# Patient Record
Sex: Female | Born: 1955
Health system: Southern US, Community
[De-identification: ages and names within clinical notes are randomized; demographics above are authoritative.]

## PROBLEM LIST (undated history)

## (undated) DIAGNOSIS — D649 Anemia, unspecified: Secondary | ICD-10-CM

## (undated) DIAGNOSIS — T7840XA Allergy, unspecified, initial encounter: Secondary | ICD-10-CM

## (undated) DIAGNOSIS — I214 Non-ST elevation (NSTEMI) myocardial infarction: Secondary | ICD-10-CM

## (undated) DIAGNOSIS — E7800 Pure hypercholesterolemia, unspecified: Secondary | ICD-10-CM

## (undated) DIAGNOSIS — I10 Essential (primary) hypertension: Secondary | ICD-10-CM

## (undated) DIAGNOSIS — E079 Disorder of thyroid, unspecified: Secondary | ICD-10-CM

## (undated) DIAGNOSIS — M1990 Unspecified osteoarthritis, unspecified site: Secondary | ICD-10-CM

## (undated) HISTORY — PX: EYE SURGERY: SHX253

## (undated) HISTORY — DX: Allergy, unspecified, initial encounter: T78.40XA

## (undated) HISTORY — DX: Anemia, unspecified: D64.9

## (undated) HISTORY — PX: FOOT SURGERY: SHX648

## (undated) HISTORY — DX: Unspecified osteoarthritis, unspecified site: M19.90

## (undated) HISTORY — PX: TUBAL LIGATION: SHX77

## (undated) HISTORY — PX: THYROID SURGERY: SHX805

## (undated) HISTORY — DX: Non-ST elevation (NSTEMI) myocardial infarction: I21.4

---

## 1999-10-10 ENCOUNTER — Encounter: Admission: RE | Admit: 1999-10-10 | Discharge: 1999-10-10 | Payer: Self-pay | Admitting: Rheumatology

## 1999-10-10 ENCOUNTER — Other Ambulatory Visit: Admission: RE | Admit: 1999-10-10 | Discharge: 1999-10-10 | Payer: Self-pay | Admitting: Obstetrics and Gynecology

## 1999-10-10 ENCOUNTER — Encounter: Payer: Self-pay | Admitting: Rheumatology

## 2000-04-01 ENCOUNTER — Encounter: Payer: Self-pay | Admitting: Internal Medicine

## 2000-04-01 ENCOUNTER — Encounter: Admission: RE | Admit: 2000-04-01 | Discharge: 2000-04-01 | Payer: Self-pay | Admitting: Internal Medicine

## 2000-11-25 ENCOUNTER — Other Ambulatory Visit: Admission: RE | Admit: 2000-11-25 | Discharge: 2000-11-25 | Payer: Self-pay | Admitting: Obstetrics and Gynecology

## 2001-12-17 ENCOUNTER — Other Ambulatory Visit: Admission: RE | Admit: 2001-12-17 | Discharge: 2001-12-17 | Payer: Self-pay | Admitting: Obstetrics and Gynecology

## 2002-12-20 ENCOUNTER — Other Ambulatory Visit: Admission: RE | Admit: 2002-12-20 | Discharge: 2002-12-20 | Payer: Self-pay | Admitting: Obstetrics and Gynecology

## 2003-12-29 ENCOUNTER — Other Ambulatory Visit: Admission: RE | Admit: 2003-12-29 | Discharge: 2003-12-29 | Payer: Self-pay | Admitting: Obstetrics and Gynecology

## 2004-04-24 ENCOUNTER — Encounter: Admission: RE | Admit: 2004-04-24 | Discharge: 2004-07-23 | Payer: Self-pay | Admitting: Internal Medicine

## 2007-01-09 ENCOUNTER — Other Ambulatory Visit: Admission: RE | Admit: 2007-01-09 | Discharge: 2007-01-09 | Payer: Self-pay | Admitting: Obstetrics and Gynecology

## 2011-08-02 ENCOUNTER — Emergency Department (HOSPITAL_COMMUNITY)
Admission: EM | Admit: 2011-08-02 | Discharge: 2011-08-02 | Disposition: A | Discharge status: Home / Self Care | Payer: No Typology Code available for payment source | Attending: Emergency Medicine | Admitting: Emergency Medicine

## 2011-08-02 ENCOUNTER — Emergency Department (HOSPITAL_COMMUNITY): Payer: No Typology Code available for payment source

## 2011-08-02 ENCOUNTER — Encounter: Payer: Self-pay | Admitting: *Deleted

## 2011-08-02 DIAGNOSIS — R0781 Pleurodynia: Secondary | ICD-10-CM

## 2011-08-02 DIAGNOSIS — Z79899 Other long term (current) drug therapy: Secondary | ICD-10-CM | POA: Insufficient documentation

## 2011-08-02 DIAGNOSIS — R079 Chest pain, unspecified: Secondary | ICD-10-CM | POA: Insufficient documentation

## 2011-08-02 DIAGNOSIS — R1011 Right upper quadrant pain: Secondary | ICD-10-CM | POA: Insufficient documentation

## 2011-08-02 DIAGNOSIS — I10 Essential (primary) hypertension: Secondary | ICD-10-CM | POA: Insufficient documentation

## 2011-08-02 DIAGNOSIS — E119 Type 2 diabetes mellitus without complications: Secondary | ICD-10-CM | POA: Insufficient documentation

## 2011-08-02 HISTORY — DX: Disorder of thyroid, unspecified: E07.9

## 2011-08-02 HISTORY — DX: Essential (primary) hypertension: I10

## 2011-08-02 LAB — HEPATIC FUNCTION PANEL
ALT: 16 U/L (ref 0–35)
AST: 16 U/L (ref 0–37)
Albumin: 3.8 g/dL (ref 3.5–5.2)
Alkaline Phosphatase: 63 U/L (ref 39–117)
Bilirubin, Direct: <0.1 mg/dL (ref 0.0–0.3)
Total Bilirubin: 0.3 mg/dL (ref 0.3–1.2)
Total Protein: 7.3 g/dL (ref 6.0–8.3)

## 2011-08-02 LAB — PROTIME-INR
INR: 0.97 (ref 0.00–1.49)
Prothrombin Time: 13.1 seconds (ref 11.6–15.2)

## 2011-08-02 LAB — BASIC METABOLIC PANEL
BUN: 9 mg/dL (ref 6–23)
CO2: 27 mEq/L (ref 19–32)
Calcium: 10 mg/dL (ref 8.4–10.5)
Chloride: 100 mEq/L (ref 96–112)
Creatinine, Ser: 0.87 mg/dL (ref 0.50–1.10)
GFR calc Af Amer: 85 mL/min — ABNORMAL LOW (ref >90)
GFR calc non Af Amer: 73 mL/min — ABNORMAL LOW (ref >90)
Glucose, Bld: 123 mg/dL — ABNORMAL HIGH (ref 70–99)
Potassium: 3.7 mEq/L (ref 3.5–5.1)
Sodium: 140 mEq/L (ref 135–145)

## 2011-08-02 LAB — POCT I-STAT TROPONIN I: Troponin i, poc: 0 ng/mL (ref 0.00–0.08)

## 2011-08-02 LAB — CBC
HCT: 33 % — ABNORMAL LOW (ref 36.0–46.0)
Hemoglobin: 10.8 g/dL — ABNORMAL LOW (ref 12.0–15.0)
MCH: 28.1 pg (ref 26.0–34.0)
MCHC: 32.7 g/dL (ref 30.0–36.0)
MCV: 85.7 fL (ref 78.0–100.0)
Platelets: 236 10*3/uL (ref 150–400)
RBC: 3.85 MIL/uL — ABNORMAL LOW (ref 3.87–5.11)
RDW: 13.7 % (ref 11.5–15.5)
WBC: 4.4 10*3/uL (ref 4.0–10.5)

## 2011-08-02 LAB — LIPASE, BLOOD: Lipase: 36 U/L (ref 11–59)

## 2011-08-02 LAB — GLUCOSE, CAPILLARY: Glucose-Capillary: 114 mg/dL — ABNORMAL HIGH (ref 70–99)

## 2011-08-02 MED ORDER — HYDROCODONE-ACETAMINOPHEN 5-325 MG PO TABS: 2.0000 | ORAL_TABLET | ORAL | Status: AC | PRN

## 2011-08-02 MED ORDER — IOHEXOL 300 MG/ML  SOLN
100.0000 mL | Freq: Once | INTRAMUSCULAR | Status: AC | PRN
  Administered 2011-08-02: 100 mL via INTRAVENOUS

## 2011-08-02 MED ORDER — IBUPROFEN 800 MG PO TABS: 800.0000 mg | ORAL_TABLET | Freq: Three times a day (TID) | ORAL | Status: AC

## 2011-08-02 NOTE — ED Notes (Signed)
No old ecg 

## 2011-08-02 NOTE — ED Notes (Signed)
Family at bedside. 

## 2011-08-02 NOTE — ED Notes (Signed)
Dis charge IV PER TO DISCHARGE. 

## 2011-08-02 NOTE — ED Notes (Signed)
Check pt. cbg 114mg  report to ED WHITE RN.
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
## 2012-01-20 ENCOUNTER — Ambulatory Visit
Admission: RE | Admit: 2012-01-20 | Discharge: 2012-01-20 | Disposition: A | Discharge status: Home / Self Care | Payer: BC Managed Care – PPO | Source: Ambulatory Visit | Attending: Family Medicine | Admitting: Family Medicine

## 2012-01-20 ENCOUNTER — Other Ambulatory Visit: Payer: Self-pay | Admitting: Family Medicine

## 2012-01-20 DIAGNOSIS — R609 Edema, unspecified: Secondary | ICD-10-CM

## 2012-01-20 DIAGNOSIS — R52 Pain, unspecified: Secondary | ICD-10-CM

## 2012-04-23 ENCOUNTER — Encounter: Payer: Self-pay | Admitting: Obstetrics and Gynecology

## 2012-08-23 DIAGNOSIS — I214 Non-ST elevation (NSTEMI) myocardial infarction: Secondary | ICD-10-CM

## 2012-08-23 HISTORY — DX: Non-ST elevation (NSTEMI) myocardial infarction: I21.4

## 2012-08-23 HISTORY — PX: CARDIAC CATHETERIZATION: SHX172

## 2012-09-21 ENCOUNTER — Inpatient Hospital Stay (HOSPITAL_COMMUNITY)
Admission: EM | Admit: 2012-09-21 | Discharge: 2012-09-23 | DRG: 122 | Disposition: A | Discharge status: Home / Self Care | Payer: BC Managed Care – PPO | Attending: Cardiology | Admitting: Cardiology

## 2012-09-21 ENCOUNTER — Encounter (HOSPITAL_COMMUNITY): Payer: Self-pay | Admitting: Cardiology

## 2012-09-21 ENCOUNTER — Inpatient Hospital Stay (HOSPITAL_COMMUNITY): Payer: BC Managed Care – PPO

## 2012-09-21 DIAGNOSIS — E119 Type 2 diabetes mellitus without complications: Secondary | ICD-10-CM | POA: Diagnosis present

## 2012-09-21 DIAGNOSIS — I214 Non-ST elevation (NSTEMI) myocardial infarction: Secondary | ICD-10-CM

## 2012-09-21 DIAGNOSIS — E669 Obesity, unspecified: Secondary | ICD-10-CM | POA: Diagnosis present

## 2012-09-21 DIAGNOSIS — Z880 Allergy status to penicillin: Secondary | ICD-10-CM

## 2012-09-21 DIAGNOSIS — I10 Essential (primary) hypertension: Secondary | ICD-10-CM | POA: Diagnosis present

## 2012-09-21 DIAGNOSIS — E876 Hypokalemia: Secondary | ICD-10-CM | POA: Diagnosis present

## 2012-09-21 DIAGNOSIS — I201 Angina pectoris with documented spasm: Secondary | ICD-10-CM | POA: Diagnosis present

## 2012-09-21 DIAGNOSIS — E079 Disorder of thyroid, unspecified: Secondary | ICD-10-CM | POA: Diagnosis present

## 2012-09-21 DIAGNOSIS — E785 Hyperlipidemia, unspecified: Secondary | ICD-10-CM | POA: Diagnosis present

## 2012-09-21 HISTORY — DX: Pure hypercholesterolemia, unspecified: E78.00

## 2012-09-21 LAB — BASIC METABOLIC PANEL
BUN: 13 mg/dL (ref 6–23)
CO2: 25 mEq/L (ref 19–32)
Calcium: 10.3 mg/dL (ref 8.4–10.5)
Chloride: 101 mEq/L (ref 96–112)
Creatinine, Ser: 0.78 mg/dL (ref 0.50–1.10)
GFR calc Af Amer: >90 mL/min (ref >90)
GFR calc non Af Amer: >90 mL/min (ref >90)
Glucose, Bld: 165 mg/dL — ABNORMAL HIGH (ref 70–99)
Potassium: 3.3 mEq/L — ABNORMAL LOW (ref 3.5–5.1)
Sodium: 139 mEq/L (ref 135–145)

## 2012-09-21 LAB — CBC
HCT: 35.5 % — ABNORMAL LOW (ref 36.0–46.0)
Hemoglobin: 11.6 g/dL — ABNORMAL LOW (ref 12.0–15.0)
MCH: 28.1 pg (ref 26.0–34.0)
MCHC: 32.7 g/dL (ref 30.0–36.0)
MCV: 86 fL (ref 78.0–100.0)
Platelets: 253 10*3/uL (ref 150–400)
RBC: 4.13 MIL/uL (ref 3.87–5.11)
RDW: 14.3 % (ref 11.5–15.5)
WBC: 5.8 10*3/uL (ref 4.0–10.5)

## 2012-09-21 LAB — GLUCOSE, CAPILLARY: Glucose-Capillary: 133 mg/dL — ABNORMAL HIGH (ref 70–99)

## 2012-09-21 LAB — POCT I-STAT TROPONIN I: Troponin i, poc: 2.1 ng/mL (ref 0.00–0.08)

## 2012-09-21 LAB — TROPONIN I: Troponin I: 2.9 ng/mL (ref <0.30)

## 2012-09-21 MED ORDER — ATORVASTATIN CALCIUM 80 MG PO TABS
80.0000 mg | ORAL_TABLET | Freq: Every day | ORAL | Status: DC
  Administered 2012-09-22 (×2): 80 mg via ORAL
  Filled 2012-09-21 (×2): qty 1

## 2012-09-21 MED ORDER — HEPARIN (PORCINE) IN NACL 100-0.45 UNIT/ML-% IJ SOLN
900.0000 [IU]/h | INTRAMUSCULAR | Status: DC
  Administered 2012-09-21: 1000 [IU]/h via INTRAVENOUS
  Filled 2012-09-21 (×2): qty 250

## 2012-09-21 MED ORDER — CLOPIDOGREL BISULFATE 75 MG PO TABS
75.0000 mg | ORAL_TABLET | Freq: Every day | ORAL | Status: DC
  Administered 2012-09-22 – 2012-09-23 (×2): 75 mg via ORAL
  Filled 2012-09-21 (×3): qty 1

## 2012-09-21 MED ORDER — CLOPIDOGREL BISULFATE 300 MG PO TABS
600.0000 mg | ORAL_TABLET | Freq: Once | ORAL | Status: AC
  Administered 2012-09-22: 600 mg via ORAL
  Filled 2012-09-21: qty 2

## 2012-09-21 MED ORDER — ASPIRIN EC 81 MG PO TBEC
81.0000 mg | DELAYED_RELEASE_TABLET | Freq: Every day | ORAL | Status: DC
  Administered 2012-09-22 – 2012-09-23 (×2): 81 mg via ORAL
  Filled 2012-09-21 (×2): qty 1

## 2012-09-21 MED ORDER — SODIUM CHLORIDE 0.9 % IV SOLN
INTRAVENOUS | Status: AC
  Administered 2012-09-21: via INTRAVENOUS

## 2012-09-21 MED ORDER — HEPARIN BOLUS VIA INFUSION
4000.0000 [IU] | Freq: Once | INTRAVENOUS | Status: AC
  Administered 2012-09-21: 4000 [IU] via INTRAVENOUS

## 2012-09-21 MED ORDER — ONDANSETRON HCL 4 MG/2ML IJ SOLN: 4.0000 mg | Freq: Four times a day (QID) | INTRAMUSCULAR | Status: DC | PRN

## 2012-09-21 MED ORDER — METOPROLOL TARTRATE 25 MG PO TABS
25.0000 mg | ORAL_TABLET | Freq: Two times a day (BID) | ORAL | Status: DC
  Administered 2012-09-22 (×3): 25 mg via ORAL
  Filled 2012-09-21 (×3): qty 1

## 2012-09-21 MED ORDER — NITROGLYCERIN 0.4 MG SL SUBL: 0.4000 mg | SUBLINGUAL_TABLET | SUBLINGUAL | Status: DC | PRN

## 2012-09-21 MED ORDER — ACETAMINOPHEN 325 MG PO TABS: 650.0000 mg | ORAL_TABLET | ORAL | Status: DC | PRN

## 2012-09-21 NOTE — H&P (Signed)
Cardiology History and Physical  Sissy Hoff, MD  History of Present Illness (and review of medical records): Jeanne [name] is a 56 y.o. female who presents for evaluation of chest pain.  She developed chest pressure that was mid sternal with radiation to back on Friday around 7pm while out at basketball game.  Pressure felt like indigestion.  She rated it 10/10 and took some Tums.  Pressure eased off after few hours and was mild on Sat around 2/10.  She then had no pressure on Sunday, just felt "sluggish".  Today while at work, indigestion returned at 3-4/10.  She was seen by nurse at work and directed to PCP.  She had POC troponin checked which returned elevated.  She was sent to ED for further evaluation.  She is chest pain free at this time.  She was given ASA and started on Heparin.  Previous diagnostic testing for coronary artery disease includes: none. Previous history of cardiac disease includes None. Coronary artery disease risk factors include: diabetes mellitus, dyslipidemia, hypertension and obesity (BMI >= 30 kg/m2). Patient denies history of angina, arrhythmia, CHF, ischemic heart disease, previous M.I. and valvular disease.  Review of Systems Further review of systems was otherwise negative other than stated in HPI.  Patient Active Problem List   Diagnosis Date Noted  . NSTEMI (non-ST elevated myocardial infarction) 09/21/2012   Past Medical History  Diagnosis Date  . Hypertension   . Diabetes mellitus   . Thyroid disease     Past Surgical History  Procedure Date  . Foot surgery      (Not in a hospital admission) Allergies  Allergen Reactions  . Eggs Or Egg-Derived Products Other (See Comments)    Unknown reaction-patient states she was allergic to egg yolks when she was little. She can eat eggs now, but does not take flu shot or other egg-containing medications.  . Penicillins Other (See Comments)    Unknown-reaction as a child     History  Substance Use  Topics  . Smoking status: Never Smoker   . Smokeless tobacco: Not on file  . Alcohol Use: No    History reviewed. No pertinent family history.   Objective: Patient Vitals for the past 8 hrs:  BP Temp Temp src Pulse Resp SpO2 Height Weight  09/21/12 2045 110/63 mmHg - - 73  19  99 % - -  09/21/12 2015 125/73 mmHg - - 69  19  100 % - -  09/21/12 1945 143/66 mmHg - - 76  18  100 % - -  09/21/12 1941 - - - - - - 5\' 7"  (1.702 m) 87.998 kg (194 lb)  09/21/12 1916 139/71 mmHg 98.1 F (36.7 C) Oral 81  18  100 % - -   General Appearance:    Alert, cooperative, no distress, appears stated age  Head:    Normocephalic, without obvious abnormality, atraumatic  Eyes:     PERRL, EOMI, anicteric sclerae  Neck:   Supple, no carotid bruit or JVD  Lungs:     Clear to auscultation bilaterally, respirations unlabored  Heart:    Regular rate and rhythm, S1 and S2 normal, no murmur  Abdomen:     Soft, non-tender, normoactive bowel sounds  Extremities:   Extremities normal, atraumatic, no cyanosis or edema  Pulses:   2+ and symmetric all extremities  Skin:   no rashes or lesions  Neurologic:   No focal deficits. AAO x3   Results for orders placed during the  hospital encounter of 09/21/12 (from the past 48 hour(s))  CBC     Status: Abnormal   Collection Time   09/21/12  7:19 PM      Component Value Range Comment   WBC 5.8  4.0 - 10.5 K/uL    RBC 4.13  3.87 - 5.11 MIL/uL    Hemoglobin 11.6 (*) 12.0 - 15.0 g/dL    HCT 16.1 (*) 09.6 - 46.0 %    MCV 86.0  78.0 - 100.0 fL    MCH 28.1  26.0 - 34.0 pg    MCHC 32.7  30.0 - 36.0 g/dL    RDW 04.5  40.9 - 81.1 %    Platelets 253  150 - 400 K/uL   BASIC METABOLIC PANEL     Status: Abnormal   Collection Time   09/21/12  7:19 PM      Component Value Range Comment   Sodium 139  135 - 145 mEq/L    Potassium 3.3 (*) 3.5 - 5.1 mEq/L    Chloride 101  96 - 112 mEq/L    CO2 25  19 - 32 mEq/L    Glucose, Bld 165 (*) 70 - 99 mg/dL    BUN 13  6 - 23 mg/dL     Creatinine, Ser 9.14  0.50 - 1.10 mg/dL    Calcium 78.2  8.4 - 10.5 mg/dL    GFR calc non Af Amer >90  >90 mL/min    GFR calc Af Amer >90  >90 mL/min   POCT I-STAT TROPONIN I     Status: Abnormal   Collection Time   09/21/12  7:55 PM      Component Value Range Comment   Troponin i, poc 2.10 (*) 0.00 - 0.08 ng/mL    Comment NOTIFIED PHYSICIAN      Comment 3             No results found.  ECG:  Sinus rhythm RBBB, no significant change from prior 07/2011.  Assessment: 80F hx of DM, HTN, HLD, presents with chest pressure and elevated troponin suggestive of ACS/NSTEMI.  Plan: 1. Admit to Cardiology, ICU 2. Continuous monitoring on Telemetry. 3. Repeat ekg on admit, prn chest pain or arrythmia 4. Trend cardiac biomarkers, check lipids, hgba1c, tsh 5. Medical management to include ASA, Plavix load, Heparin gtt, BB, Statin, NTG prn 6.   NPO after MN, gentle IVFs 7.   Will keep NPO for possible ischemic evaluation with cardiac catheterization.

## 2012-09-21 NOTE — ED Notes (Signed)
Pt here for elevated trop. Level from PCP office, for further work up. Pt denies any chest pain, and had an EKG done this afternoon at office.

## 2012-09-21 NOTE — ED Provider Notes (Signed)
History     CSN: [id number]  Arrival date & time 09/21/12  1191   First MD Initiated Contact with Patient 09/21/12 1946      Chief Complaint  Patient presents with  . abnormal lab     (Consider location/radiation/quality/duration/timing/severity/associated sxs/prior treatment) Patient is a 56 y.o. female presenting with chest pain. The history is provided by the patient.  Chest Pain The chest pain began yesterday. Chest pain occurs intermittently. The chest pain is resolved. Associated with: nothing. At its most intense, the pain is at 6/10. The pain is currently at 0/10. The severity of the pain is moderate. The quality of the pain is described as dull, heavy and aching. The pain does not radiate. Pertinent negatives for primary symptoms include no fever, no shortness of breath, no cough, no wheezing, no abdominal pain, no nausea and no vomiting. She tried aspirin for the symptoms.  Her past medical history is significant for diabetes, hyperlipidemia and hypertension.  Pertinent negatives for past medical history include no MI.  Procedure history is negative for cardiac catheterization.     Past Medical History  Diagnosis Date  . Hypertension   . Diabetes mellitus   . Thyroid disease     Past Surgical History  Procedure Date  . Foot surgery     History reviewed. No pertinent family history.  History  Substance Use Topics  . Smoking status: Never Smoker   . Smokeless tobacco: Not on file  . Alcohol Use: No    OB History    Grav Para Term Preterm Abortions TAB SAB Ect Mult Living                  Review of Systems  Constitutional: Negative for fever.  Respiratory: Negative for cough, shortness of breath and wheezing.   Cardiovascular: Positive for chest pain.  Gastrointestinal: Negative for nausea, vomiting and abdominal pain.  All other systems reviewed and are negative.    Allergies  Eggs or egg-derived products and Penicillins  Home Medications    Current Outpatient Rx  Name  Route  Sig  Dispense  Refill  . ATENOLOL 50 MG PO TABS   Oral   Take 50 mg by mouth daily.         Marland Kitchen CALCIUM PO   Oral   Take 1 tablet by mouth daily. Chewable calcium         . LEVOTHYROXINE SODIUM 75 MCG PO TABS   Oral   Take 75 mcg by mouth every morning. Take on an empty stomach         . METFORMIN HCL 1000 MG PO TABS   Oral   Take 1,000 mg by mouth 2 (two) times daily.          . ADULT MULTIVITAMIN W/MINERALS CH   Oral   Take 1 tablet by mouth daily.         Marland Kitchen SIMVASTATIN 20 MG PO TABS   Oral   Take 20 mg by mouth every evening.         Marland Kitchen SITAGLIPTIN PHOSPHATE 100 MG PO TABS   Oral   Take 100 mg by mouth daily.         Marland Kitchen VALSARTAN-HYDROCHLOROTHIAZIDE 160-25 MG PO TABS   Oral   Take 1 tablet by mouth daily.           BP 110/63  Pulse 73  Temp 98.1 F (36.7 C) (Oral)  Resp 19  Ht 5\' 6"  (1.676 m)  Wt 194 lb (87.998 kg)  BMI 31.31 kg/m2  SpO2 99%  Physical Exam  Nursing note and vitals reviewed. Constitutional: She is oriented to person, place, and time. She appears well-developed and well-nourished. No distress.  HENT:  Head: Normocephalic and atraumatic.  Mouth/Throat: Oropharynx is clear and moist.  Eyes: Conjunctivae normal and EOM are normal. Pupils are equal, round, and reactive to light.  Neck: Normal range of motion. Neck supple.  Cardiovascular: Normal rate, regular rhythm and intact distal pulses.   No murmur heard. Pulmonary/Chest: Effort normal and breath sounds normal. No respiratory distress. She has no wheezes. She has no rales.  Abdominal: Soft. She exhibits no distension. There is no tenderness. There is no rebound and no guarding.  Musculoskeletal: Normal range of motion. She exhibits no edema and no tenderness.  Neurological: She is alert and oriented to person, place, and time.  Skin: Skin is warm and dry. No rash noted. No erythema.  Psychiatric: She has a normal mood and affect. Her  behavior is normal.    ED Course  Procedures (including critical care time)  Labs Reviewed  CBC - Abnormal; Notable for the following:    Hemoglobin 11.6 (*)     HCT 35.5 (*)     All other components within normal limits  BASIC METABOLIC PANEL - Abnormal; Notable for the following:    Potassium 3.3 (*)     Glucose, Bld 165 (*)     All other components within normal limits  POCT I-STAT TROPONIN I - Abnormal; Notable for the following:    Troponin i, poc 2.10 (*)     All other components within normal limits  HEPARIN LEVEL (UNFRACTIONATED)  CBC  TROPONIN I   No results found.   Date: 09/21/2012  Rate: 81  Rhythm: normal sinus rhythm  QRS Axis: normal  Intervals: normal  ST/T Wave abnormalities: nonspecific ST/T changes  Conduction Disutrbances:right bundle branch block  Narrative Interpretation:   Old EKG Reviewed: unchanged   1. NSTEMI (non-ST elevated myocardial infarction)       MDM   Patient with chest pain earlier today who saw her physician and had a positive troponin. She was hit here for further evaluation. On arrival here patient is chest pain-free. She states the chest pain resolved after taking 325 of aspirin earlier today. She does have a history of hypertension, diabetes, hyperlipidemia. EKG shows a right bundle branch block but otherwise within normal limits. Troponin is elevated at 2.1. Patient was started on heparin drip and cardiology was contacted for admission and catheterization.       Gwyneth Sprout, MD 09/21/12 2202

## 2012-09-22 ENCOUNTER — Encounter (HOSPITAL_COMMUNITY)
Admission: EM | Disposition: A | Discharge status: Home / Self Care | Payer: Self-pay | Source: Home / Self Care | Attending: Cardiology

## 2012-09-22 DIAGNOSIS — I214 Non-ST elevation (NSTEMI) myocardial infarction: Secondary | ICD-10-CM

## 2012-09-22 HISTORY — PX: LEFT HEART CATHETERIZATION WITH CORONARY ANGIOGRAM: SHX5451

## 2012-09-22 LAB — CBC
HCT: 33.6 % — ABNORMAL LOW (ref 36.0–46.0)
HCT: 32.9 % — ABNORMAL LOW (ref 36.0–46.0)
Hemoglobin: 11.4 g/dL — ABNORMAL LOW (ref 12.0–15.0)
Hemoglobin: 11 g/dL — ABNORMAL LOW (ref 12.0–15.0)
MCH: 29.1 pg (ref 26.0–34.0)
MCH: 28.8 pg (ref 26.0–34.0)
MCHC: 33.9 g/dL (ref 30.0–36.0)
MCHC: 33.4 g/dL (ref 30.0–36.0)
MCV: 85.7 fL (ref 78.0–100.0)
MCV: 86.1 fL (ref 78.0–100.0)
Platelets: 258 10*3/uL (ref 150–400)
Platelets: 259 10*3/uL (ref 150–400)
RBC: 3.92 MIL/uL (ref 3.87–5.11)
RBC: 3.82 MIL/uL — ABNORMAL LOW (ref 3.87–5.11)
RDW: 14.6 % (ref 11.5–15.5)
RDW: 14.4 % (ref 11.5–15.5)
WBC: 6.5 10*3/uL (ref 4.0–10.5)
WBC: 4.9 10*3/uL (ref 4.0–10.5)

## 2012-09-22 LAB — PROTIME-INR
INR: 1.06 (ref 0.00–1.49)
Prothrombin Time: 13.7 seconds (ref 11.6–15.2)

## 2012-09-22 LAB — BASIC METABOLIC PANEL
BUN: 11 mg/dL (ref 6–23)
CO2: 24 mEq/L (ref 19–32)
Calcium: 9.6 mg/dL (ref 8.4–10.5)
Chloride: 102 mEq/L (ref 96–112)
Creatinine, Ser: 0.7 mg/dL (ref 0.50–1.10)
GFR calc Af Amer: >90 mL/min (ref >90)
GFR calc non Af Amer: >90 mL/min (ref >90)
Glucose, Bld: 134 mg/dL — ABNORMAL HIGH (ref 70–99)
Potassium: 3.1 mEq/L — ABNORMAL LOW (ref 3.5–5.1)
Sodium: 139 mEq/L (ref 135–145)

## 2012-09-22 LAB — LIPID PANEL
Cholesterol: 125 mg/dL (ref 0–200)
HDL: 42 mg/dL (ref >39)
LDL Cholesterol: 58 mg/dL (ref 0–99)
Total CHOL/HDL Ratio: 3 RATIO
Triglycerides: 127 mg/dL (ref <150)
VLDL: 25 mg/dL (ref 0–40)

## 2012-09-22 LAB — CREATININE, SERUM
Creatinine, Ser: 0.69 mg/dL (ref 0.50–1.10)
GFR calc Af Amer: >90 mL/min (ref >90)
GFR calc non Af Amer: >90 mL/min (ref >90)

## 2012-09-22 LAB — GLUCOSE, CAPILLARY
Glucose-Capillary: 133 mg/dL — ABNORMAL HIGH (ref 70–99)
Glucose-Capillary: 135 mg/dL — ABNORMAL HIGH (ref 70–99)
Glucose-Capillary: 137 mg/dL — ABNORMAL HIGH (ref 70–99)
Glucose-Capillary: 167 mg/dL — ABNORMAL HIGH (ref 70–99)

## 2012-09-22 LAB — TROPONIN I
Troponin I: 1.68 ng/mL (ref <0.30)
Troponin I: 1.85 ng/mL (ref <0.30)
Troponin I: 2.25 ng/mL (ref <0.30)

## 2012-09-22 LAB — MRSA PCR SCREENING: MRSA by PCR: NEGATIVE

## 2012-09-22 LAB — HEPARIN LEVEL (UNFRACTIONATED): Heparin Unfractionated: 1.27 IU/mL — ABNORMAL HIGH (ref 0.30–0.70)

## 2012-09-22 LAB — TSH: TSH: 1.605 u[IU]/mL (ref 0.350–4.500)

## 2012-09-22 LAB — HEMOGLOBIN A1C
Hgb A1c MFr Bld: 7.5 % — ABNORMAL HIGH (ref <5.7)
Mean Plasma Glucose: 169 mg/dL — ABNORMAL HIGH (ref <117)

## 2012-09-22 SURGERY — LEFT HEART CATHETERIZATION WITH CORONARY ANGIOGRAM
Anesthesia: LOCAL

## 2012-09-22 MED ORDER — SODIUM CHLORIDE 0.9 % IV SOLN: INTRAVENOUS | Status: DC

## 2012-09-22 MED ORDER — ACETAMINOPHEN 325 MG PO TABS: 650.0000 mg | ORAL_TABLET | ORAL | Status: DC | PRN

## 2012-09-22 MED ORDER — SODIUM CHLORIDE 0.9 % IV SOLN: 250.0000 mL | INTRAVENOUS | Status: DC | PRN

## 2012-09-22 MED ORDER — ASPIRIN 81 MG PO CHEW
324.0000 mg | CHEWABLE_TABLET | ORAL | Status: AC
  Administered 2012-09-22: 324 mg via ORAL
  Filled 2012-09-22: qty 4

## 2012-09-22 MED ORDER — HEPARIN SODIUM (PORCINE) 1000 UNIT/ML IJ SOLN
INTRAMUSCULAR | Status: AC
  Filled 2012-09-22: qty 1

## 2012-09-22 MED ORDER — HEPARIN SODIUM (PORCINE) 5000 UNIT/ML IJ SOLN
5000.0000 [IU] | Freq: Three times a day (TID) | INTRAMUSCULAR | Status: DC
  Administered 2012-09-22 – 2012-09-23 (×2): 5000 [IU] via SUBCUTANEOUS
  Filled 2012-09-22 (×6): qty 1

## 2012-09-22 MED ORDER — POTASSIUM CHLORIDE CRYS ER 20 MEQ PO TBCR
60.0000 meq | EXTENDED_RELEASE_TABLET | Freq: Once | ORAL | Status: AC
  Administered 2012-09-22: 60 meq via ORAL
  Filled 2012-09-22: qty 3

## 2012-09-22 MED ORDER — NITROGLYCERIN 0.2 MG/ML ON CALL CATH LAB
INTRAVENOUS | Status: AC
  Filled 2012-09-22: qty 1

## 2012-09-22 MED ORDER — VERAPAMIL HCL 2.5 MG/ML IV SOLN
INTRAVENOUS | Status: AC
  Filled 2012-09-22: qty 2

## 2012-09-22 MED ORDER — MIDAZOLAM HCL 2 MG/2ML IJ SOLN
INTRAMUSCULAR | Status: AC
  Filled 2012-09-22: qty 2

## 2012-09-22 MED ORDER — LIDOCAINE HCL (PF) 1 % IJ SOLN
INTRAMUSCULAR | Status: AC
  Filled 2012-09-22: qty 30

## 2012-09-22 MED ORDER — SODIUM CHLORIDE 0.9 % IJ SOLN: 3.0000 mL | INTRAMUSCULAR | Status: DC | PRN

## 2012-09-22 MED ORDER — SODIUM CHLORIDE 0.9 % IJ SOLN
3.0000 mL | Freq: Two times a day (BID) | INTRAMUSCULAR | Status: DC
  Administered 2012-09-22: 3 mL via INTRAVENOUS
  Administered 2012-09-22: 6 mL via INTRAVENOUS

## 2012-09-22 MED ORDER — HEPARIN (PORCINE) IN NACL 2-0.9 UNIT/ML-% IJ SOLN
INTRAMUSCULAR | Status: AC
  Filled 2012-09-22: qty 1000

## 2012-09-22 MED ORDER — FENTANYL CITRATE 0.05 MG/ML IJ SOLN
INTRAMUSCULAR | Status: AC
  Filled 2012-09-22: qty 2

## 2012-09-22 MED ORDER — DIAZEPAM 5 MG PO TABS
5.0000 mg | ORAL_TABLET | ORAL | Status: AC
  Administered 2012-09-22: 5 mg via ORAL
  Filled 2012-09-22: qty 1

## 2012-09-22 MED ORDER — INSULIN ASPART 100 UNIT/ML ~~LOC~~ SOLN
0.0000 [IU] | Freq: Three times a day (TID) | SUBCUTANEOUS | Status: DC
  Administered 2012-09-22 – 2012-09-23 (×3): 1 [IU] via SUBCUTANEOUS

## 2012-09-22 MED ORDER — SODIUM CHLORIDE 0.9 % IV SOLN: INTRAVENOUS | Status: AC

## 2012-09-22 MED ORDER — ONDANSETRON HCL 4 MG/2ML IJ SOLN: 4.0000 mg | Freq: Four times a day (QID) | INTRAMUSCULAR | Status: DC | PRN

## 2012-09-22 NOTE — Progress Notes (Signed)
    SUBJECTIVE: No chest pain this am. No SOB.   BP 124/55  Pulse 74  Temp 98.6 F (37 C) (Oral)  Resp 16  Ht 5' 6" (1.676 m)  Wt 193 lb 2 oz (87.6 kg)  BMI 31.17 kg/m2  SpO2 98%  Intake/Output Summary (Last 24 hours) at 09/22/12 0855 Last data filed at 09/22/12 0700  Gross per 24 hour  Intake    388 ml  Output      0 ml  Net    388 ml    PHYSICAL EXAM General: Well developed, well nourished, in no acute distress. Alert and oriented x 3.  Psych:  Good affect, responds appropriately Neck: No JVD. No masses noted.  Lungs: Clear bilaterally with no wheezes or rhonci noted.  Heart: RRR with no murmurs noted. Abdomen: Bowel sounds are present. Soft, non-tender.  Extremities: No lower extremity edema.   LABS: Basic Metabolic Panel:  Basename 09/22/12 0254 09/21/12 1919  NA 139 139  K 3.1* 3.3*  CL 102 101  CO2 24 25  GLUCOSE 134* 165*  BUN 11 13  CREATININE 0.70 0.78  CALCIUM 9.6 10.3  MG -- --  PHOS -- --   CBC:  Basename 09/22/12 0254 09/21/12 1919  WBC 6.5 5.8  NEUTROABS -- --  HGB 11.4* 11.6*  HCT 33.6* 35.5*  MCV 85.7 86.0  PLT 258 253   Cardiac Enzymes:  Basename 09/22/12 0510 09/22/12 0006 09/21/12 2138  CKTOTAL -- -- --  CKMB -- -- --  CKMBINDEX -- -- --  TROPONINI 1.85* 2.25* 2.90*   Fasting Lipid Panel:  Basename 09/22/12 0254  CHOL 125  HDL 42  LDLCALC 58  TRIG 127  CHOLHDL 3.0  LDLDIRECT --    Current Meds:    . aspirin EC  81 mg Oral Daily  . atorvastatin  80 mg Oral q1800  . clopidogrel  75 mg Oral Q breakfast  . insulin aspart  0-9 Units Subcutaneous TID WC  . metoprolol tartrate  25 mg Oral BID     ASSESSMENT AND PLAN:  1. NSTEMI: Pt admitted with unstable angina. Cardiac markers elevated. Plan for left heart cath today.  She is on a heparin drip. Continue heparin, ASA, Plavix, statin, beta blocker. Clear liquid breakfast then NPO. R/B of procedure reviewed with pt. She has been loaded with Plavix.    Jeanne [name]  12/31/20138:55 AM  

## 2012-09-22 NOTE — Progress Notes (Signed)
eLink Physician-Brief Progress Note Patient Name: Jeanne [name] DOB: [birth year] MRN: [id number]  Date of Service  09/22/2012   HPI/Events of Note     eICU Interventions  Hypokalemia, repleted    Intervention Category Intermediate Interventions: Electrolyte abnormality - evaluation and management  MCQUAID, DOUGLAS 09/22/2012, 6:55 AM

## 2012-09-22 NOTE — Interval H&P Note (Signed)
History and Physical Interval Note:  09/22/2012 1:39 PM  Jeanne [name]  has presented today for surgery, with the diagnosis of chest pain  The various methods of treatment have been discussed with the patient and family. After consideration of risks, benefits and other options for treatment, the patient has consented to  Procedure(s) (LRB) with comments: LEFT HEART CATHETERIZATION WITH CORONARY ANGIOGRAM (N/A) as a surgical intervention .  The patient's history has been reviewed, patient examined, no change in status, stable for surgery.  I have reviewed the patient's chart and labs.  Questions were answered to the patient's satisfaction.     Anavictoria Wilk [employer]

## 2012-09-22 NOTE — Progress Notes (Signed)
Utilization Review Completed.Guilherme Schwenke T12/31/2013   

## 2012-09-22 NOTE — H&P (View-Only) (Signed)
    SUBJECTIVE: No chest pain this am. No SOB.   BP 124/55  Pulse 74  Temp 98.6 F (37 C) (Oral)  Resp 16  Ht 5\' 6"  (1.676 m)  Wt 193 lb 2 oz (87.6 kg)  BMI 31.17 kg/m2  SpO2 98%  Intake/Output Summary (Last 24 hours) at 09/22/12 0855 Last data filed at 09/22/12 0700  Gross per 24 hour  Intake    388 ml  Output      0 ml  Net    388 ml    PHYSICAL EXAM General: Well developed, well nourished, in no acute distress. Alert and oriented x 3.  Psych:  Good affect, responds appropriately Neck: No JVD. No masses noted.  Lungs: Clear bilaterally with no wheezes or rhonci noted.  Heart: RRR with no murmurs noted. Abdomen: Bowel sounds are present. Soft, non-tender.  Extremities: No lower extremity edema.   LABS: Basic Metabolic Panel:  Basename 09/22/12 0254 09/21/12 1919  NA 139 139  K 3.1* 3.3*  CL 102 101  CO2 24 25  GLUCOSE 134* 165*  BUN 11 13  CREATININE 0.70 0.78  CALCIUM 9.6 10.3  MG -- --  PHOS -- --   CBC:  Basename 09/22/12 0254 09/21/12 1919  WBC 6.5 5.8  NEUTROABS -- --  HGB 11.4* 11.6*  HCT 33.6* 35.5*  MCV 85.7 86.0  PLT 258 253   Cardiac Enzymes:  Basename 09/22/12 0510 09/22/12 0006 09/21/12 2138  CKTOTAL -- -- --  CKMB -- -- --  CKMBINDEX -- -- --  TROPONINI 1.85* 2.25* 2.90*   Fasting Lipid Panel:  Basename 09/22/12 0254  CHOL 125  HDL 42  LDLCALC 58  TRIG 127  CHOLHDL 3.0  LDLDIRECT --    Current Meds:    . aspirin EC  81 mg Oral Daily  . atorvastatin  80 mg Oral q1800  . clopidogrel  75 mg Oral Q breakfast  . insulin aspart  0-9 Units Subcutaneous TID WC  . metoprolol tartrate  25 mg Oral BID     ASSESSMENT AND PLAN:  1. NSTEMI: Pt admitted with unstable angina. Cardiac markers elevated. Plan for left heart cath today.  She is on a heparin drip. Continue heparin, ASA, Plavix, statin, beta blocker. Clear liquid breakfast then NPO. R/B of procedure reviewed with pt. She has been loaded with Plavix.    MCALHANY,CHRISTOPHER  12/31/20138:55 AM

## 2012-09-22 NOTE — Progress Notes (Signed)
ANTICOAGULATION CONSULT NOTE  Pharmacy Consult for Heparin Indication: chest pain/ACS  Allergies  Allergen Reactions  . Eggs Or Egg-Derived Products Other (See Comments)    Unknown reaction-patient states she was allergic to egg yolks when she was little. She can eat eggs now, but does not take flu shot or other egg-containing medications.  . Penicillins Other (See Comments)    Unknown-reaction as a child     Patient Measurements: Height: 5\' 6"  (167.6 cm) Weight: 193 lb 2 oz (87.6 kg) IBW/kg (Calculated) : 59.3  Heparin Dosing Weight: 85 kg   Vital Signs: Temp: 98.4 F (36.9 C) (12/31 0000) Temp src: Oral (12/31 0000) BP: 123/68 mmHg (12/31 0100) Pulse Rate: 80  (12/31 0100)  Labs:  Basename 09/22/12 0254 09/22/12 0006 09/21/12 2138 09/21/12 1919  HGB 11.4* -- -- 11.6*  HCT 33.6* -- -- 35.5*  PLT 258 -- -- 253  APTT -- -- -- --  LABPROT 13.Jeanne -- -- --  INR 1.06 -- -- --  HEPARINUNFRC 1.27* -- -- --  CREATININE 0.70 -- -- 0.78  CKTOTAL -- -- -- --  CKMB -- -- -- --  TROPONINI -- 2.25* 2.90* --    Estimated Creatinine Clearance: 86.5 ml/min (by C-G formula based on Cr of 0.Jeanne).   Medical History: Past Medical History  Diagnosis Date  . Hypertension   . Diabetes mellitus   . Thyroid disease   . Hypercholesteremia 2011?    Medications:  Prescriptions prior to admission  Medication Sig Dispense Refill  . atenolol (TENORMIN) 50 MG tablet Take 50 mg by mouth daily.      Marland Kitchen CALCIUM PO Take 1 tablet by mouth daily. Chewable calcium      . levothyroxine (SYNTHROID, LEVOTHROID) 75 MCG tablet Take 75 mcg by mouth every morning. Take on an empty stomach      . metFORMIN (GLUCOPHAGE) 1000 MG tablet Take 1,000 mg by mouth 2 (two) times daily.       . Multiple Vitamin (MULTIVITAMIN WITH MINERALS) TABS Take 1 tablet by mouth daily.      . simvastatin (ZOCOR) 20 MG tablet Take 20 mg by mouth every evening.      . sitaGLIPtin (JANUVIA) 100 MG tablet Take 100 mg by mouth  daily.      . valsartan-hydrochlorothiazide (DIOVAN-HCT) 160-25 MG per tablet Take 1 tablet by mouth daily.        Assessment: 56 yo Jeanne [name] with chest pain for Heparin.  Heparin 4000 units IV bolus, 1000 units/hr started at [facility] at 9 pm.  Initial level elevated.  Heparin infusing properly and level drawn appropriately from opposite arm    Goal of Therapy:  Heparin level 0.3-0.Jeanne units/ml Monitor platelets by anticoagulation protocol: Yes   Plan:  Hold heparin x 1 hour, then decrease heparin 900 units/hr F/U after cath.  Jeanne Jeanne [name] 09/22/2012,4:16 AM

## 2012-09-22 NOTE — CV Procedure (Signed)
   Cardiac Catheterization Procedure Note  Name: Jeanne [name] MRN: [id number] DOB: 21-Jun-1956  Procedure: Left Heart Cath, Selective Coronary Angiography, LV angiography  Indication: NSTEMI   Procedural Details: The right wrist was prepped, draped, and anesthetized with 1% lidocaine. Using the modified Seldinger technique, a 5 French sheath was introduced into the right radial artery. 3 mg of verapamil was administered through the sheath, weight-based unfractionated heparin was administered intravenously. Standard Judkins catheters were used for selective coronary angiography and left ventriculography. Catheter exchanges were performed over an exchange length guidewire. There were no immediate procedural complications. A TR band was used for radial hemostasis at the completion of the procedure.  The patient was transferred to the post catheterization recovery area for further monitoring.  Procedural Findings: Hemodynamics: AO 143/84 LV 136/10  Coronary angiography: Coronary dominance: right  Left mainstem: No significant coronary disease.   Left anterior descending (LAD): No significant coronary disease.   Left circumflex (LCx): No significant coronary disease.   Right coronary artery (RCA): No significant coronary disease.   Left ventriculography: Left ventricular systolic function is normal, LVEF is estimated at 60-65%, there is no significant mitral regurgitation. Normal wall motion.   Final Conclusions:  No angiographic CAD.  Normal LV systolic function with no evidence for Takotsubo syndrome.  Patient does not describe symptoms consistent with a viral syndrome to suggest acute myocarditis.  Coronary vasospasm is a possibility for the troponin elevation. Will add amlodipine to medical regimen as she is also hypertensive.   [name] 09/22/2012, 2:09 PM

## 2012-09-23 DIAGNOSIS — I214 Non-ST elevation (NSTEMI) myocardial infarction: Secondary | ICD-10-CM

## 2012-09-23 LAB — CBC
HCT: 33 % — ABNORMAL LOW (ref 36.0–46.0)
Hemoglobin: 10.9 g/dL — ABNORMAL LOW (ref 12.0–15.0)
MCH: 28.7 pg (ref 26.0–34.0)
MCHC: 33 g/dL (ref 30.0–36.0)
MCV: 86.8 fL (ref 78.0–100.0)
Platelets: 243 10*3/uL (ref 150–400)
RBC: 3.8 MIL/uL — ABNORMAL LOW (ref 3.87–5.11)
RDW: 14.5 % (ref 11.5–15.5)
WBC: 4.3 10*3/uL (ref 4.0–10.5)

## 2012-09-23 LAB — GLUCOSE, CAPILLARY
Glucose-Capillary: 138 mg/dL — ABNORMAL HIGH (ref 70–99)
Glucose-Capillary: 121 mg/dL — ABNORMAL HIGH (ref 70–99)

## 2012-09-23 LAB — D-DIMER, QUANTITATIVE (NOT AT ARMC): D-Dimer, Quant: <0.27 ug/mL-FEU (ref 0.00–0.48)

## 2012-09-23 MED ORDER — IRBESARTAN 150 MG PO TABS
150.0000 mg | ORAL_TABLET | Freq: Every day | ORAL | Status: DC
  Administered 2012-09-23: 150 mg via ORAL
  Filled 2012-09-23: qty 1

## 2012-09-23 MED ORDER — CLOPIDOGREL BISULFATE 75 MG PO TABS: 75.0000 mg | ORAL_TABLET | Freq: Every day | ORAL | Status: DC

## 2012-09-23 MED ORDER — HYDROCHLOROTHIAZIDE 25 MG PO TABS
25.0000 mg | ORAL_TABLET | Freq: Every day | ORAL | Status: DC
  Administered 2012-09-23: 25 mg via ORAL
  Filled 2012-09-23: qty 1

## 2012-09-23 MED ORDER — AMLODIPINE BESYLATE 5 MG PO TABS
5.0000 mg | ORAL_TABLET | Freq: Every day | ORAL | Status: DC
  Administered 2012-09-23: 5 mg via ORAL
  Filled 2012-09-23 (×3): qty 1

## 2012-09-23 MED ORDER — AMLODIPINE BESYLATE 5 MG PO TABS: 5.0000 mg | ORAL_TABLET | Freq: Every day | ORAL | Status: DC

## 2012-09-23 MED ORDER — NITROGLYCERIN 0.4 MG SL SUBL: 0.4000 mg | SUBLINGUAL_TABLET | SUBLINGUAL | Status: DC | PRN

## 2012-09-23 MED ORDER — ACETAMINOPHEN 325 MG PO TABS: 650.0000 mg | ORAL_TABLET | ORAL | Status: DC | PRN

## 2012-09-23 NOTE — Progress Notes (Signed)
    SUBJECTIVE: No chest pain or SOB this am.   BP 132/72  Pulse 68  Temp 98.3 F (36.8 C) (Oral)  Resp 18  Ht 5\' 6"  (1.676 m)  Wt 193 lb 2 oz (87.6 kg)  BMI 31.17 kg/m2  SpO2 97%  Intake/Output Summary (Last 24 hours) at 09/23/12 0755 Last data filed at 09/22/12 2200  Gross per 24 hour  Intake   1191 ml  Output      0 ml  Net   1191 ml    PHYSICAL EXAM General: Well developed, well nourished, in no acute distress. Alert and oriented x 3.  Psych:  Good affect, responds appropriately Neck: No JVD. No masses noted.  Lungs: Clear bilaterally with no wheezes or rhonci noted.  Heart: RRR with no murmurs noted. Abdomen: Bowel sounds are present. Soft, non-tender.  Extremities: No lower extremity edema.   LABS: Basic Metabolic Panel:  Basename 09/22/12 1444 09/22/12 0254 09/21/12 1919  NA -- 139 139  K -- 3.1* 3.3*  CL -- 102 101  CO2 -- 24 25  GLUCOSE -- 134* 165*  BUN -- 11 13  CREATININE 0.69 0.70 --  CALCIUM -- 9.6 10.3  MG -- -- --  PHOS -- -- --   CBC:  Basename 09/23/12 0555 09/22/12 1444  WBC 4.3 4.9  NEUTROABS -- --  HGB 10.9* 11.0*  HCT 33.0* 32.9*  MCV 86.8 86.1  PLT 243 259   Cardiac Enzymes:  Basename 09/22/12 1110 09/22/12 0510 09/22/12 0006  CKTOTAL -- -- --  CKMB -- -- --  CKMBINDEX -- -- --  TROPONINI 1.68* 1.85* 2.25*   Fasting Lipid Panel:  Basename 09/22/12 0254  CHOL 125  HDL 42  LDLCALC 58  TRIG 127  CHOLHDL 3.0  LDLDIRECT --    Current Meds:    . amLODipine  5 mg Oral Daily  . aspirin EC  81 mg Oral Daily  . clopidogrel  75 mg Oral Q breakfast  . heparin  5,000 Units Subcutaneous Q8H  . insulin aspart  0-9 Units Subcutaneous TID WC     ASSESSMENT AND PLAN:  1. Elevated troponin: No evidence of CAD on cath yesterday. LV function normal. Possible coronary vasospasm. Norvasc has been started. D-dimer normal this am so probability of PE is very low. Will continue ASA and Plavix for one month given her presentation  with features of unstable angina and elevated troponin. We have stopped her atenolol which she took at home. I will restart her ARB/HCTZ at home dose (Diovan/HCT 160/25 but we do not have Diovan on formulary).  2. Dispo: D/C home today. She can follow up with me in 4 weeks. She is asking to be out of work until she is seen in our office. She will need a work excuse for 4 weeks.    Eara Burruel  1/1/20148:49 AM

## 2012-09-23 NOTE — Progress Notes (Signed)
09/23/2012 12:25 PM  Nursing note Discharge avs form, medications already taken today and those due this evening given and explained to patient and family. Pt. Had question regarding resuming 81mg  Aspirin daily. Verbal orders received from Joni Reining NP to add Aspirin 81 mg daily to pt. AVS discharge paperwork. Orders enacted. Pt. Updated on plan. Follow up appointments, when to call MD and cath site care reviewed. D/c iv line. D/c tele. Correct use of SL NTG reviewed using teach back method and pt. Verbalized understanding. D/c home with family per orders.  Thanya Cegielski, Blanchard Kelch

## 2012-09-23 NOTE — Discharge Summary (Signed)
Physician Discharge Summary  Patient ID: Jeanne [name] MRN: [id number] DOB/AGE: [birth year] 58 y.o.  Admit date: 09/21/2012 Discharge date: 09/23/2012  Primary Discharge Diagnosis 1. NSTEMI  Secondary Discharge Diagnosis:  1. Hypertension 2. Diabetes 3. Thyroid Diseas 4. Hypercholesterolemia   Significant Diagnostic Studies: Cardiac Cath 09/22/2012 Procedural Findings:  Hemodynamics:  AO 143/84  LV 136/10  Coronary angiography:  Coronary dominance: right  Left mainstem: No significant coronary disease.  Left anterior descending (LAD): No significant coronary disease.  Left circumflex (LCx): No significant coronary disease.  Right coronary artery (RCA): No significant coronary disease.  Left ventriculography: Left ventricular systolic function is normal, LVEF is estimated at 60-65%, there is no significant mitral regurgitation. Normal wall motion.  Final Conclusions: No angiographic CAD. Normal LV systolic function with no evidence for Takotsubo syndrome. Patient does not describe symptoms consistent with a viral syndrome to suggest acute myocarditis. Coronary vasospasm is a possibility for the troponin elevation. Will add amlodipine to medical regimen as she is also hypertensive.  [name]  09/22/2012, 2:09 PM      Consults: None  Hospital Course: Jeanne [name] is a 57 year old patient who was admitted with chest pain. She is normally followed by Dr. Susa Raring primary care. She developed chest pressure was midsternal radiation to the back with indigestion-like symptoms. She initially took some TUMS and did not find significant relief. She was seen by her PCP who directed ER after a point-of-care troponin was checked in their office and found to be elevated. She was subsequently seen in ED and admitted with NSTEMI. Troponin was found to be 2.90, 2.25, and 1.85 respectively. She was seen by Dr. Verne Carrow who scheduled her for cardiac catheterization.   Cardiac  catheterization was completed on 09/22/2012 by Dr. [name]. She had no angiographic CAD. She had normal LV systolic function with no evidence for Takotsubo syndrome. Coronary vasospasm was a possibility for troponin elevation. She was taken off metoprolol and started on amlodipine. She was found to be hypertensive and restarted on ARB with HCTZ which she was taking prior to admission. She was also started on Plavix 75 mg daily along with aspirin. She will be out of work until seen in our office in one week. Prescription for nitroglycerin glycerin sublingual was also provided. She was seen and examined Dr. Clifton James discharge and found to be stable.  Discharge Exam: Blood pressure 132/72, pulse 68, temperature 98.3 F (36.8 C), temperature source Oral, resp. rate 18, height 5\' 6"  (1.676 m), weight 193 lb 2 oz (87.6 kg), SpO2 97.00%.  Lab Results  Component Value Date   WBC 4.3 09/23/2012   HGB 10.9* 09/23/2012   HCT 33.0* 09/23/2012   MCV 86.8 09/23/2012   PLT 243 09/23/2012     Lab 09/22/12 1444 09/22/12 0254  NA -- 139  K -- 3.1*  CL -- 102  CO2 -- 24  BUN -- 11  CREATININE 0.69 --  CALCIUM -- 9.6  PROT -- --  BILITOT -- --  ALKPHOS -- --  ALT -- --  AST -- --  GLUCOSE -- 134*   Lab Results  Component Value Date   TROPONINI 1.68* 09/22/2012    Lab Results  Component Value Date   CHOL 125 09/22/2012   Lab Results  Component Value Date   HDL 42 09/22/2012   Lab Results  Component Value Date   LDLCALC 58 09/22/2012   Lab Results  Component Value Date   TRIG 127 09/22/2012   Lab  Results  Component Value Date   CHOLHDL 3.0 09/22/2012   No results found for this basename: LDLDIRECT      Radiology: Dg Chest Port 1 View  09/21/2012  *RADIOLOGY REPORT*  Clinical Data: Chest pain; history of diabetes.  PORTABLE CHEST - 1 VIEW  Comparison: Chest radiograph performed 08/02/2011  Findings: The lungs are well-aerated and clear.  There is no evidence of focal opacification,  pleural effusion or pneumothorax.  The cardiomediastinal silhouette is normal in size; calcification is noted within the aortic arch.  No acute osseous abnormalities are seen.  IMPRESSION: No acute cardiopulmonary process seen.   Original Report Authenticated By: Tonia Ghent, M.D.     EKG:09/24/2011 Normal sinus rhythm Right bundle branch block Inferior infarct , age undetermined  FOLLOW UP PLANS AND APPOINTMENTS     Discharge Orders    Future Orders Please Complete By Expires   Diet - low sodium heart healthy      Increase activity slowly          Medication List     As of 09/23/2012  9:04 AM    STOP taking these medications         atenolol 50 MG tablet   Commonly known as: TENORMIN      TAKE these medications         acetaminophen 325 MG tablet   Commonly known as: TYLENOL   Take 2 tablets (650 mg total) by mouth every 4 (four) hours as needed.      amLODipine 5 MG tablet   Commonly known as: NORVASC   Take 1 tablet (5 mg total) by mouth daily.      CALCIUM PO   Take 1 tablet by mouth daily. Chewable calcium      clopidogrel 75 MG tablet   Commonly known as: PLAVIX   Take 1 tablet (75 mg total) by mouth daily with breakfast.      levothyroxine 75 MCG tablet   Commonly known as: SYNTHROID, LEVOTHROID   Take 75 mcg by mouth every morning. Take on an empty stomach      metFORMIN 1000 MG tablet   Commonly known as: GLUCOPHAGE   Take 1,000 mg by mouth 2 (two) times daily.      multivitamin with minerals Tabs   Take 1 tablet by mouth daily.      nitroGLYCERIN 0.4 MG SL tablet   Commonly known as: NITROSTAT   Place 1 tablet (0.4 mg total) under the tongue every 5 (five) minutes x 3 doses as needed for chest pain.      simvastatin 20 MG tablet   Commonly known as: ZOCOR   Take 20 mg by mouth every evening.      sitaGLIPtin 100 MG tablet   Commonly known as: JANUVIA   Take 100 mg by mouth daily.      valsartan-hydrochlorothiazide 160-25 MG per tablet    Commonly known as: DIOVAN-HCT   Take 1 tablet by mouth daily.        Follow-up Information    Follow up with Verne Carrow, MD. (Our office will call you for appointment)    Contact information:   [address] [place] [address] [phone]            Time spent with patient to include physician time: 35 minutes Signed: Joni Reining 09/23/2012, 9:04 AM Co-Sign MD

## 2012-09-24 NOTE — Discharge Summary (Signed)
See full note dated 09/23/12. cdm

## 2012-10-05 ENCOUNTER — Encounter: Payer: Self-pay | Admitting: Nurse Practitioner

## 2012-10-05 ENCOUNTER — Ambulatory Visit (INDEPENDENT_AMBULATORY_CARE_PROVIDER_SITE_OTHER): Payer: BC Managed Care – PPO | Admitting: Nurse Practitioner

## 2012-10-05 ENCOUNTER — Telehealth: Payer: Self-pay | Admitting: Nurse Practitioner

## 2012-10-05 VITALS — BP 120/88 | HR 92 | Ht 66.75 in | Wt 193.8 lb

## 2012-10-05 DIAGNOSIS — I214 Non-ST elevation (NSTEMI) myocardial infarction: Secondary | ICD-10-CM

## 2012-10-05 LAB — CBC WITH DIFFERENTIAL/PLATELET
Basophils Absolute: 0 10*3/uL (ref 0.0–0.1)
Basophils Relative: 0.3 % (ref 0.0–3.0)
Eosinophils Absolute: 0.1 10*3/uL (ref 0.0–0.7)
Eosinophils Relative: 1.6 % (ref 0.0–5.0)
HCT: 34.8 % — ABNORMAL LOW (ref 36.0–46.0)
Hemoglobin: 11.4 g/dL — ABNORMAL LOW (ref 12.0–15.0)
Lymphocytes Relative: 31.3 % (ref 12.0–46.0)
Lymphs Abs: 1.7 10*3/uL (ref 0.7–4.0)
MCHC: 32.9 g/dL (ref 30.0–36.0)
MCV: 87.2 fl (ref 78.0–100.0)
Monocytes Absolute: 0.3 10*3/uL (ref 0.1–1.0)
Monocytes Relative: 4.9 % (ref 3.0–12.0)
Neutro Abs: 3.4 10*3/uL (ref 1.4–7.7)
Neutrophils Relative %: 61.9 % (ref 43.0–77.0)
Platelets: 284 10*3/uL (ref 150.0–400.0)
RBC: 3.99 Mil/uL (ref 3.87–5.11)
RDW: 15.1 % — ABNORMAL HIGH (ref 11.5–14.6)
WBC: 5.5 10*3/uL (ref 4.5–10.5)

## 2012-10-05 LAB — BASIC METABOLIC PANEL
BUN: 11 mg/dL (ref 6–23)
CO2: 28 mEq/L (ref 19–32)
Calcium: 9.6 mg/dL (ref 8.4–10.5)
Chloride: 102 mEq/L (ref 96–112)
Creatinine, Ser: 0.8 mg/dL (ref 0.4–1.2)
GFR: 102.37 mL/min (ref >60.00)
Glucose, Bld: 142 mg/dL — ABNORMAL HIGH (ref 70–99)
Potassium: 3.6 mEq/L (ref 3.5–5.1)
Sodium: 139 mEq/L (ref 135–145)

## 2012-10-05 MED ORDER — PANTOPRAZOLE SODIUM 40 MG PO TBEC: 40.0000 mg | DELAYED_RELEASE_TABLET | Freq: Every day | ORAL | Status: DC

## 2012-10-05 MED ORDER — CARVEDILOL 3.125 MG PO TABS: 3.1250 mg | ORAL_TABLET | Freq: Two times a day (BID) | ORAL | Status: DC

## 2012-10-05 NOTE — Patient Instructions (Addendum)
Get a new BP cuff to monitor your BP at home - Omron is a good brand  We are adding Protonix 40 mg daily for your stomach. This is at the drug store.   We are not going to add back the Tenormin but will add Coreg 3.125 mg two times a day in its place. This is at the drug store.  We need to check labs today (BMET/CBC)  You may return to work as of tomorrow. No restrictions  We will check to see if you are eligible for cardiac rehab  No medicines with sudaphed. For cold symptoms use Coricidin HBP. You can use antihistamines (Claritin, Zyrtec, Bendadry)  Walking every day - start at 5 to 10 minutes each day and add a minute a day. Goal is 45 to 60 minutes a day  See Dr. Clifton James in 3 weeks  Call the [facility] office at [phone] if you have any questions, problems or concerns.

## 2012-10-05 NOTE — Telephone Encounter (Signed)
New Problem:    Patient called in because her Coreg was not at the pharmacy after her OV and was not listed in her medication list.  Please call back.

## 2012-10-05 NOTE — Progress Notes (Addendum)
Jeanne [name] Date of Birth: 06-30-1956 Medical Record #[id number]  History of Present Illness: Jeanne [name] is seen back today for a post hospital visit. She is seen for Dr. Clifton James. She has HTN, DM, HLD and thyroid disease. Has had recent NSTEMI and underwent cath showing normal coronaries and normal LV function. Etiology felt to be vasospasm.  She comes in today. She is here with her daughter. She is doing ok. No more chest pain. She does note that her heart rate is now much higher and this causes her to feel upper chest discomfort. No actual pain that was like what brought her to the hospital. Quite anxious and has multiple questions about her medicines, activities, the cath findings, potassium being low, her course of therapy, etc. She wants to go to cardiac rehab. Has not returned back to work yet. No problems with her cath site (right arm). Needs a paper filled out for work. Not dizzy or lightheaded.   Current Outpatient Prescriptions on File Prior to Visit  Medication Sig Dispense Refill  . amLODipine (NORVASC) 5 MG tablet Take 1 tablet (5 mg total) by mouth daily.  30 tablet  10  . CALCIUM PO Take 1 tablet by mouth daily. Chewable calcium      . clopidogrel (PLAVIX) 75 MG tablet Take 1 tablet (75 mg total) by mouth daily with breakfast.  30 tablet  10  . JANUMET XR 50-1000 MG TB24 Take by mouth 2 (two) times daily.       Marland Kitchen levothyroxine (SYNTHROID, LEVOTHROID) 75 MCG tablet Take 75 mcg by mouth every morning. Take on an empty stomach      . Multiple Vitamin (MULTIVITAMIN WITH MINERALS) TABS Take 1 tablet by mouth daily.      . nitroGLYCERIN (NITROSTAT) 0.4 MG SL tablet Place 1 tablet (0.4 mg total) under the tongue every 5 (five) minutes x 3 doses as needed for chest pain.  30 tablet  4  . simvastatin (ZOCOR) 20 MG tablet Take 20 mg by mouth every evening.      . valsartan-hydrochlorothiazide (DIOVAN-HCT) 160-25 MG per tablet Take 1 tablet by mouth daily.      . pantoprazole  (PROTONIX) 40 MG tablet Take 1 tablet (40 mg total) by mouth daily.  30 tablet  11    Allergies  Allergen Reactions  . Eggs Or Egg-Derived Products Other (See Comments)    Unknown reaction-patient states she was allergic to egg yolks when she was little. She can eat eggs now, but does not take flu shot or other egg-containing medications.  . Penicillins Other (See Comments)    Unknown-reaction as a child     Past Medical History  Diagnosis Date  . Hypertension   . Diabetes mellitus   . Thyroid disease   . Hypercholesteremia 2011?  . NSTEMI (non-ST elevated myocardial infarction) December 2013    negative cath, normal LV function - felt to be vasospasm    Past Surgical History  Procedure Date  . Foot surgery   . Thyroid surgery     Partial removed per pt  . Cardiac catheterization December 2013    normal coronaries and normal LV function    History  Smoking status  . Former Smoker  . Quit date: 11/21/1990  Smokeless tobacco  . Not on file    History  Alcohol Use No    History reviewed. No pertinent family history.  Review of Systems: The review of systems is per the HPI.  All other  systems were reviewed and are negative.  Physical Exam: BP 120/88  Pulse 92  Ht 5' 6.75" (1.695 m)  Wt 193 lb 12.8 oz (87.907 kg)  BMI 30.58 kg/m2 Patient is very pleasant and in no acute distress. She is obese. Skin is warm and dry. Color is normal.  HEENT is unremarkable. Normocephalic/atraumatic. PERRL. Sclera are nonicteric. Neck is supple. No masses. No JVD. Lungs are clear. Cardiac exam shows a regular rate and rhythm. Abdomen is soft. Extremities are without edema. Gait and ROM are intact. No gross neurologic deficits noted.  LABORATORY DATA:  Coronary angiography:   Left mainstem: No significant coronary disease.  Left anterior descending (LAD): No significant coronary disease.  Left circumflex (LCx): No significant coronary disease.  Right coronary artery (RCA): No  significant coronary disease.   Left ventriculography: Left ventricular systolic function is normal, LVEF is estimated at 60-65%, there is no significant mitral regurgitation. Normal wall motion.   Final Conclusions: No angiographic CAD. Normal LV systolic function with no evidence for Takotsubo syndrome. Patient does not describe symptoms consistent with a viral syndrome to suggest acute myocarditis. Coronary vasospasm is a possibility for the troponin elevation. Will add amlodipine to medical regimen as she is also hypertensive.   Jeanne [name]  09/22/2012, 2:09 PM   Lab Results  Component Value Date   WBC 4.3 09/23/2012   HGB 10.9* 09/23/2012   HCT 33.0* 09/23/2012   PLT 243 09/23/2012   GLUCOSE 134* 09/22/2012   CHOL 125 09/22/2012   TRIG 127 09/22/2012   HDL 42 09/22/2012   LDLCALC 58 09/22/2012   ALT 16 08/02/2011   AST 16 08/02/2011   NA 139 09/22/2012   K 3.1* 09/22/2012   CL 102 09/22/2012   CREATININE 0.69 09/22/2012   BUN 11 09/22/2012   CO2 24 09/22/2012   TSH 1.605 09/22/2012   INR 1.06 09/22/2012   HGBA1C 7.5* 09/22/2012    Lab Results  Component Value Date   TROPONINI 1.68* 09/22/2012    Assessment / Plan:  1. Recent NSTEMI - felt to be due to vasospasm - now on Norvasc and Plavix. No real trigger identified.  She is doing well. We will see if she qualifies for cardiac rehab. See Dr. Clifton James back in about 3 weeks. May return to work. We have discussed the need to avoid decongestants. Her return to work paper is filled out with no restrictions.   2. HTN - recently restarted on her ARB and HCTZ therapy - blood pressure looks ok. Heart rate is up. I have talked with Dr. Clifton James. Will not restart her Atenolol but add Coreg     3.125 mg BID. This should help her feeling of fast heart beating.   3. Hypokalemia - need to recheck labs today  4. GERD - will have her use Protonix 40 mg daily  Patient is agreeable to this plan and will call if any problems develop in the  interim.

## 2012-10-05 NOTE — Telephone Encounter (Signed)
T/w pt sent into pharmacy coreg 3.125 bid pt understood how to take med and repeated instructions back

## 2012-10-06 ENCOUNTER — Telehealth: Payer: Self-pay | Admitting: *Deleted

## 2012-10-06 ENCOUNTER — Other Ambulatory Visit: Payer: Self-pay | Admitting: Nurse Practitioner

## 2012-10-06 DIAGNOSIS — I214 Non-ST elevation (NSTEMI) myocardial infarction: Secondary | ICD-10-CM

## 2012-10-06 NOTE — Telephone Encounter (Signed)
Message copied by Debbe Bales on Tue Oct 06, 2012 10:20 AM ------      Message from: Rosalio Macadamia      Created: Tue Oct 06, 2012 10:00 AM       Duwayne Heck,      I have placed an order for cardiac rehab. Please call Ms. Hepner and let her know she should be hearing from them in the next few days and to call us back if she does not hear.            Lawson Fiscal      ----- Message -----         From: Thayer Headings, RN         Sent: 10/06/2012   9:15 AM           To: Rosalio Macadamia, NP            Good morning Lawson Fiscal!  Yes Ms Adderley can come to cardiac rehab.  I could not find a referral card on her.            Thanks and have a great day!            Byrd Hesselbach                  ----- Message -----         From: Rosalio Macadamia, NP         Sent: 10/05/2012  11:24 AM           To: Thayer Headings, RN            Can this patient come to cardiac rehab?      She had a NSTEMI but felt to be due to vasospasm. No stent and no CABG            Thanks      lori

## 2012-10-06 NOTE — Telephone Encounter (Signed)
t/w pt about cardiac rehab and will call us back if pt dosen't hear from rehab patient verbally understood

## 2012-10-08 NOTE — Telephone Encounter (Signed)
Will forward to Danielle to follow up on Cardiac Rehab.

## 2012-10-08 NOTE — Telephone Encounter (Signed)
F/U   status of cardiac rehab referral.

## 2012-10-09 ENCOUNTER — Telehealth: Payer: Self-pay | Admitting: *Deleted

## 2012-10-09 NOTE — Telephone Encounter (Signed)
t/w pt if she does not hear from cardiac rehab by next week to call back so we can f/u

## 2012-10-20 ENCOUNTER — Telehealth: Payer: Self-pay | Admitting: Cardiovascular Disease

## 2012-10-20 NOTE — Telephone Encounter (Signed)
Pt still has not heard from cardiac rehab

## 2012-10-20 NOTE — Telephone Encounter (Signed)
Will forward to [facility].

## 2012-10-21 NOTE — Telephone Encounter (Signed)
Spoke with Cardiac Rehab and they will contact pt.

## 2012-10-27 ENCOUNTER — Ambulatory Visit: Payer: BC Managed Care – PPO | Admitting: Cardiovascular Disease

## 2012-10-28 ENCOUNTER — Encounter: Payer: Self-pay | Admitting: Cardiovascular Disease

## 2012-10-28 ENCOUNTER — Ambulatory Visit (INDEPENDENT_AMBULATORY_CARE_PROVIDER_SITE_OTHER): Payer: BC Managed Care – PPO | Admitting: Cardiovascular Disease

## 2012-10-28 ENCOUNTER — Encounter: Payer: Self-pay | Admitting: *Deleted

## 2012-10-28 VITALS — BP 141/79 | HR 90 | Ht 66.0 in | Wt 192.2 lb

## 2012-10-28 DIAGNOSIS — Z8679 Personal history of other diseases of the circulatory system: Secondary | ICD-10-CM

## 2012-10-28 DIAGNOSIS — I10 Essential (primary) hypertension: Secondary | ICD-10-CM

## 2012-10-28 DIAGNOSIS — K219 Gastro-esophageal reflux disease without esophagitis: Secondary | ICD-10-CM

## 2012-10-28 MED ORDER — AMLODIPINE BESYLATE 10 MG PO TABS: 10.0000 mg | ORAL_TABLET | Freq: Every day | ORAL | Status: DC

## 2012-10-28 NOTE — Progress Notes (Signed)
History of Present Illness: 57 yo female with history of HTN, HLD, DM, CAD who is here today for cardiac follow up. She had a NSTEMI December 2013 and had a cardiac cath 09/22/12 which showed angiographically normal coronary artereis. It was felt that this could represent coronary vasospasm vs myocarditis. Her LV function was normal. She was seen in our office 10/05/12 by Norma Fredrickson, NP and was doing well.   She is here today for follow up. She has been feeling well. No chest pain or SOB. She is getting ready to start cardiac rehab.   Primary Care Physician: Deatra James  Last Lipid Profile:Lipid Panel     Component Value Date/Time   CHOL 125 09/22/2012 0254   TRIG 127 09/22/2012 0254   HDL 42 09/22/2012 0254   CHOLHDL 3.0 09/22/2012 0254   VLDL 25 09/22/2012 0254   LDLCALC 58 09/22/2012 0254     Past Medical History  Diagnosis Date  . Hypertension   . Diabetes mellitus   . Thyroid disease   . Hypercholesteremia 2011?  . NSTEMI (non-ST elevated myocardial infarction) December 2013    negative cath, normal LV function - felt to be vasospasm    Past Surgical History  Procedure Date  . Foot surgery   . Thyroid surgery     Partial removed per pt  . Cardiac catheterization December 2013    normal coronaries and normal LV function    Current Outpatient Prescriptions  Medication Sig Dispense Refill  . amLODipine (NORVASC) 5 MG tablet Take 1 tablet (5 mg total) by mouth daily.  30 tablet  10  . aspirin 81 MG tablet Take 81 mg by mouth daily.      Marland Kitchen CALCIUM PO Take 1 tablet by mouth daily. Chewable calcium      . carvedilol (COREG) 3.125 MG tablet Take 1 tablet (3.125 mg total) by mouth 2 (two) times daily.  180 tablet  3  . clopidogrel (PLAVIX) 75 MG tablet Take 1 tablet (75 mg total) by mouth daily with breakfast.  30 tablet  10  . JANUMET XR 50-1000 MG TB24 Take by mouth 2 (two) times daily.       Marland Kitchen levothyroxine (SYNTHROID, LEVOTHROID) 75 MCG tablet Take 75 mcg by mouth  every morning. Take on an empty stomach      . Multiple Vitamin (MULTIVITAMIN WITH MINERALS) TABS Take 1 tablet by mouth daily.      . nitroGLYCERIN (NITROSTAT) 0.4 MG SL tablet Place 1 tablet (0.4 mg total) under the tongue every 5 (five) minutes x 3 doses as needed for chest pain.  30 tablet  4  . pantoprazole (PROTONIX) 40 MG tablet Take 1 tablet (40 mg total) by mouth daily.  30 tablet  11  . simvastatin (ZOCOR) 20 MG tablet Take 20 mg by mouth every evening.      . valsartan-hydrochlorothiazide (DIOVAN-HCT) 160-25 MG per tablet Take 1 tablet by mouth daily.        Allergies  Allergen Reactions  . Eggs Or Egg-Derived Products Other (See Comments)    Unknown reaction-patient states she was allergic to egg yolks when she was little. She can eat eggs now, but does not take flu shot or other egg-containing medications.  . Penicillins Other (See Comments)    Unknown-reaction as a child     History   Social History  . Marital Status: Divorced    Spouse Name: N/A    Number of Children: N/A  . Years of  Education: N/A   Occupational History  . Not on file.   Social History Main Topics  . Smoking status: Former Smoker    Quit date: 11/21/1990  . Smokeless tobacco: Not on file  . Alcohol Use: No  . Drug Use: No  . Sexually Active:    Other Topics Concern  . Not on file   Social History Narrative  . No narrative on file    No family history on file.  Review of Systems:  As stated in the HPI and otherwise negative.   BP 141/79  Pulse 90  Ht 5\' 6"  (1.676 m)  Wt 192 lb 3.2 oz (87.181 kg)  BMI 31.02 kg/m2  Physical Examination: General: Well developed, well nourished, NAD HEENT: OP clear, mucus membranes moist SKIN: warm, dry. No rashes. Neuro: No focal deficits Musculoskeletal: Muscle strength 5/5 all ext Psychiatric: Mood and affect normal Neck: No JVD, no carotid bruits, no thyromegaly, no lymphadenopathy. Lungs:Clear bilaterally, no wheezes, rhonci,  crackles Cardiovascular: Regular rate and rhythm. No murmurs, gallops or rubs. Abdomen:Soft. Bowel sounds present. Non-tender.  Extremities: No lower extremity edema. Pulses are 2 + in the bilateral DP/PT.  EKG:NSR, rate 90 bpm. RBBB  Cardiac cath 09/22/12:  Left mainstem: No significant coronary disease.  Left anterior descending (LAD): No significant coronary disease.  Left circumflex (LCx): No significant coronary disease.  Right coronary artery (RCA): No significant coronary disease.  Left ventriculography: Left ventricular systolic function is normal, LVEF is estimated at 60-65%, there is no significant mitral regurgitation. Normal wall motion.  Final Conclusions: No angiographic CAD. Normal LV systolic function with no evidence for Takotsubo syndrome. Patient does not describe symptoms consistent with a viral syndrome to suggest acute myocarditis. Coronary vasospasm is a possibility for the troponin elevation. Will add amlodipine to medical regimen as she is also hypertensive.    Assessment and Plan:   1. Recent NSTEMI - felt to be due to vasospasm - now on Norvasc and Plavix. No real trigger identified. She is doing well. Continue current meds. Will see her back in four months and will likely stop Plavix at that time.   2. HTN: BP is still slightly elevated. Will increase Norvasc to 10 mg once daily  3. GERD - Continue Protonix 40 mg daily  4. DM: She will f/u with primary care.   5. RBBB: Chronic

## 2012-10-28 NOTE — Patient Instructions (Signed)
Your physician recommends that you schedule a follow-up appointment in:  4 months.   Your physician has recommended you make the following change in your medication:  Increase Norvasc to 10 mg by mouth daily

## 2012-10-29 ENCOUNTER — Encounter (HOSPITAL_COMMUNITY)
Admission: RE | Admit: 2012-10-29 | Discharge: 2012-10-29 | Disposition: A | Discharge status: Home / Self Care | Payer: BC Managed Care – PPO | Source: Ambulatory Visit | Attending: Cardiovascular Disease | Admitting: Cardiovascular Disease

## 2012-10-29 DIAGNOSIS — I214 Non-ST elevation (NSTEMI) myocardial infarction: Secondary | ICD-10-CM | POA: Insufficient documentation

## 2012-10-29 DIAGNOSIS — E119 Type 2 diabetes mellitus without complications: Secondary | ICD-10-CM | POA: Insufficient documentation

## 2012-10-29 DIAGNOSIS — Z5189 Encounter for other specified aftercare: Secondary | ICD-10-CM | POA: Insufficient documentation

## 2012-10-29 DIAGNOSIS — I10 Essential (primary) hypertension: Secondary | ICD-10-CM | POA: Insufficient documentation

## 2012-10-29 DIAGNOSIS — I201 Angina pectoris with documented spasm: Secondary | ICD-10-CM | POA: Insufficient documentation

## 2012-10-29 NOTE — Progress Notes (Signed)
Cardiac Rehab Medication Review by a Pharmacist  Does the patient  feel that his/her medications are working for him/her?  No. The patient does not feel that the Janumet XR tablets are controlling her blood sugars. As far as the other medications, the patient is hesitant whether the norvasc is working because her blood pressure readings are a little higher at home than in the Dr's office.   Has the patient been experiencing any side effects to the medications prescribed?  no  Does the patient measure his/her own blood pressure or blood glucose at home?  yes   Does the patient have any problems obtaining medications due to transportation or finances?   no  Understanding of regimen: good Understanding of indications: good Potential of compliance: excellent   Franchot Erichsen 10/29/2012 8:14 AM

## 2012-11-02 ENCOUNTER — Ambulatory Visit (HOSPITAL_COMMUNITY): Payer: BC Managed Care – PPO

## 2012-11-02 ENCOUNTER — Encounter (HOSPITAL_COMMUNITY): Payer: BC Managed Care – PPO

## 2012-11-04 ENCOUNTER — Encounter (HOSPITAL_COMMUNITY)
Admission: RE | Admit: 2012-11-04 | Discharge: 2012-11-04 | Disposition: A | Discharge status: Home / Self Care | Payer: BC Managed Care – PPO | Source: Ambulatory Visit | Attending: Cardiovascular Disease | Admitting: Cardiovascular Disease

## 2012-11-04 ENCOUNTER — Ambulatory Visit (HOSPITAL_COMMUNITY): Payer: BC Managed Care – PPO

## 2012-11-04 LAB — GLUCOSE, CAPILLARY
Glucose-Capillary: 181 mg/dL — ABNORMAL HIGH (ref 70–99)
Glucose-Capillary: 178 mg/dL — ABNORMAL HIGH (ref 70–99)

## 2012-11-04 NOTE — Progress Notes (Signed)
Pt started cardiac rehab today at the 815 exercise class.  Pt tolerated light exercise without difficulty.  Rehab received ok for pt to proceed with exercise post retina laser surgery with no restrictions.  Monitor showed sr with BBB, occ pvc and rare PAC.  Quality of life survey results reviewed.  Continue to monitor.

## 2012-11-05 ENCOUNTER — Ambulatory Visit (HOSPITAL_COMMUNITY): Payer: BC Managed Care – PPO

## 2012-11-06 ENCOUNTER — Encounter (HOSPITAL_COMMUNITY): Payer: BC Managed Care – PPO

## 2012-11-06 ENCOUNTER — Ambulatory Visit (HOSPITAL_COMMUNITY): Payer: BC Managed Care – PPO

## 2012-11-09 ENCOUNTER — Ambulatory Visit (HOSPITAL_COMMUNITY): Payer: BC Managed Care – PPO

## 2012-11-09 ENCOUNTER — Encounter (HOSPITAL_COMMUNITY)
Admission: RE | Admit: 2012-11-09 | Discharge: 2012-11-09 | Disposition: A | Discharge status: Home / Self Care | Payer: BC Managed Care – PPO | Source: Ambulatory Visit | Attending: Cardiovascular Disease | Admitting: Cardiovascular Disease

## 2012-11-09 LAB — GLUCOSE, CAPILLARY
Glucose-Capillary: 149 mg/dL — ABNORMAL HIGH (ref 70–99)
Glucose-Capillary: 179 mg/dL — ABNORMAL HIGH (ref 70–99)

## 2012-11-11 ENCOUNTER — Encounter (HOSPITAL_COMMUNITY)
Admission: RE | Admit: 2012-11-11 | Discharge: 2012-11-11 | Disposition: A | Discharge status: Home / Self Care | Payer: BC Managed Care – PPO | Source: Ambulatory Visit | Attending: Cardiovascular Disease | Admitting: Cardiovascular Disease

## 2012-11-11 ENCOUNTER — Ambulatory Visit (HOSPITAL_COMMUNITY): Payer: BC Managed Care – PPO

## 2012-11-11 LAB — GLUCOSE, CAPILLARY
Glucose-Capillary: 138 mg/dL — ABNORMAL HIGH (ref 70–99)
Glucose-Capillary: 118 mg/dL — ABNORMAL HIGH (ref 70–99)

## 2012-11-13 ENCOUNTER — Encounter (HOSPITAL_COMMUNITY)
Admission: RE | Admit: 2012-11-13 | Discharge: 2012-11-13 | Disposition: A | Discharge status: Home / Self Care | Payer: BC Managed Care – PPO | Source: Ambulatory Visit | Attending: Cardiovascular Disease | Admitting: Cardiovascular Disease

## 2012-11-13 ENCOUNTER — Ambulatory Visit (HOSPITAL_COMMUNITY): Payer: BC Managed Care – PPO

## 2012-11-16 ENCOUNTER — Ambulatory Visit (HOSPITAL_COMMUNITY): Payer: BC Managed Care – PPO

## 2012-11-16 ENCOUNTER — Encounter (HOSPITAL_COMMUNITY)
Admission: RE | Admit: 2012-11-16 | Discharge: 2012-11-16 | Disposition: A | Discharge status: Home / Self Care | Payer: BC Managed Care – PPO | Source: Ambulatory Visit | Attending: Cardiovascular Disease | Admitting: Cardiovascular Disease

## 2012-11-18 ENCOUNTER — Encounter (HOSPITAL_COMMUNITY)
Admission: RE | Admit: 2012-11-18 | Discharge: 2012-11-18 | Disposition: A | Discharge status: Home / Self Care | Payer: BC Managed Care – PPO | Source: Ambulatory Visit | Attending: Cardiovascular Disease | Admitting: Cardiovascular Disease

## 2012-11-18 ENCOUNTER — Ambulatory Visit (HOSPITAL_COMMUNITY): Payer: BC Managed Care – PPO

## 2012-11-20 ENCOUNTER — Ambulatory Visit (HOSPITAL_COMMUNITY): Payer: BC Managed Care – PPO

## 2012-11-20 ENCOUNTER — Encounter (HOSPITAL_COMMUNITY)
Admission: RE | Admit: 2012-11-20 | Discharge: 2012-11-20 | Disposition: A | Discharge status: Home / Self Care | Payer: BC Managed Care – PPO | Source: Ambulatory Visit | Attending: Cardiovascular Disease | Admitting: Cardiovascular Disease

## 2012-11-20 LAB — GLUCOSE, CAPILLARY: Glucose-Capillary: 101 mg/dL — ABNORMAL HIGH (ref 70–99)

## 2012-11-20 NOTE — Progress Notes (Signed)
Jeanne [name] 57 y.o. female Nutrition Note Spoke with pt.  Nutrition Plan and Nutrition Survey goals reviewed with pt. Pt is following Step 1 of the Therapeutic Lifestyle Changes diet. Pt wants to lose wt. Pt has been trying to lose wt by "exercising and decreasing healthy, high calorie snacks (e.g. Nuts, cheese)." Pt wt today 86.5 kg, which is down 2 kg over the past 2 weeks. Wt loss tips briefly reviewed.  Pt is diabetic. Last A1c indicates blood glucose not optimally controlled. Pt reports her A1c is typically less than 7 and her 09/22/12 A1c was "reflective of me over doing it over the holiday season." This writer went over Diabetes Education test results. Pt expressed understanding of the information reviewed. Pt aware of nutrition education classes offered and has attended the DM nutrition class.  Nutrition Diagnosis   Food-and nutrition-related knowledge deficit related to lack of exposure to information as related to diagnosis of: ? CVD ? DM (A1c 7.5)    Obesity related to excessive energy intake as evidenced by a BMI of 31.0  Nutrition RX/ Estimated Daily Nutrition Needs for: wt loss  1250-1750 Kcal, 35-45 gm fat, 8-13 gm sat fat, 1.2-1.8 gm trans-fat, <1500 mg sodium, 150-175 gm CHO   Nutrition Intervention   Pt's individual nutrition plan reviewed with pt.   Benefits of adopting Therapeutic Lifestyle Changes discussed when Medficts reviewed.   Pt to attend the Portion Distortion class   Pt to attend the  ? Nutrition I class                     ? Nutrition II class        ? Diabetes Blitz class - met 11/17/12          ? Diabetes Q & A class Goal(s)   Pt to identify and limit food sources of saturated fat, trans fat, and cholesterol   Pt to identify food quantities necessary to achieve: ? wt loss to a goal wt of 170-182 lb (77.6-83.0 kg) at graduation from cardiac rehab.  Monitor and Evaluate progress toward nutrition goal with team. Nutrition Risk: Change to Moderate

## 2012-11-23 ENCOUNTER — Encounter (HOSPITAL_COMMUNITY)
Admission: RE | Admit: 2012-11-23 | Discharge: 2012-11-23 | Disposition: A | Discharge status: Home / Self Care | Payer: BC Managed Care – PPO | Source: Ambulatory Visit | Attending: Cardiovascular Disease | Admitting: Cardiovascular Disease

## 2012-11-23 ENCOUNTER — Ambulatory Visit (HOSPITAL_COMMUNITY): Payer: BC Managed Care – PPO

## 2012-11-23 DIAGNOSIS — I214 Non-ST elevation (NSTEMI) myocardial infarction: Secondary | ICD-10-CM | POA: Insufficient documentation

## 2012-11-23 DIAGNOSIS — I201 Angina pectoris with documented spasm: Secondary | ICD-10-CM | POA: Insufficient documentation

## 2012-11-23 DIAGNOSIS — I10 Essential (primary) hypertension: Secondary | ICD-10-CM | POA: Insufficient documentation

## 2012-11-23 DIAGNOSIS — Z5189 Encounter for other specified aftercare: Secondary | ICD-10-CM | POA: Insufficient documentation

## 2012-11-23 DIAGNOSIS — E119 Type 2 diabetes mellitus without complications: Secondary | ICD-10-CM | POA: Insufficient documentation

## 2012-11-23 NOTE — Progress Notes (Signed)
Reviewed pt quality of life questionaire. Overall quality of life scores are excellent,  The few areas of concern for the patient are her family support and her decreased  energylevel for activities.  Pt reports her energy has improved since beginning cardiac rehab program.  This is encouraging to her.  Pt states she lives at home with her young adult children who are still accustomed to depending on her for cooking and cleaning.  Pt states her children have a difficult time understanding her change in functional status.  Fortunately, pt is taking care of herself and does not attempt to do too much or let the children take advantage of her.  Pt praised for her positive coping skills and self care management. Offered emotional support and will assess need to discuss home situation with pastoral care if deemed appropriate.

## 2012-11-25 ENCOUNTER — Encounter (HOSPITAL_COMMUNITY)
Admission: RE | Admit: 2012-11-25 | Discharge: 2012-11-25 | Disposition: A | Discharge status: Home / Self Care | Payer: BC Managed Care – PPO | Source: Ambulatory Visit | Attending: Cardiovascular Disease | Admitting: Cardiovascular Disease

## 2012-11-25 ENCOUNTER — Ambulatory Visit (HOSPITAL_COMMUNITY): Payer: BC Managed Care – PPO

## 2012-11-27 ENCOUNTER — Ambulatory Visit (HOSPITAL_COMMUNITY): Payer: BC Managed Care – PPO

## 2012-11-27 ENCOUNTER — Encounter (HOSPITAL_COMMUNITY): Payer: BC Managed Care – PPO

## 2012-11-30 ENCOUNTER — Encounter (HOSPITAL_COMMUNITY)
Admission: RE | Admit: 2012-11-30 | Discharge: 2012-11-30 | Disposition: A | Discharge status: Home / Self Care | Payer: BC Managed Care – PPO | Source: Ambulatory Visit | Attending: Cardiovascular Disease | Admitting: Cardiovascular Disease

## 2012-11-30 ENCOUNTER — Ambulatory Visit (HOSPITAL_COMMUNITY): Payer: BC Managed Care – PPO

## 2012-12-02 ENCOUNTER — Encounter (HOSPITAL_COMMUNITY)
Admission: RE | Admit: 2012-12-02 | Discharge: 2012-12-02 | Disposition: A | Discharge status: Home / Self Care | Payer: BC Managed Care – PPO | Source: Ambulatory Visit | Attending: Cardiovascular Disease | Admitting: Cardiovascular Disease

## 2012-12-02 ENCOUNTER — Ambulatory Visit (HOSPITAL_COMMUNITY): Payer: BC Managed Care – PPO

## 2012-12-04 ENCOUNTER — Ambulatory Visit (HOSPITAL_COMMUNITY): Payer: BC Managed Care – PPO

## 2012-12-04 ENCOUNTER — Encounter (HOSPITAL_COMMUNITY)
Admission: RE | Admit: 2012-12-04 | Discharge: 2012-12-04 | Disposition: A | Discharge status: Home / Self Care | Payer: BC Managed Care – PPO | Source: Ambulatory Visit | Attending: Cardiovascular Disease | Admitting: Cardiovascular Disease

## 2012-12-07 ENCOUNTER — Ambulatory Visit (HOSPITAL_COMMUNITY): Payer: BC Managed Care – PPO

## 2012-12-07 ENCOUNTER — Encounter (HOSPITAL_COMMUNITY)
Admission: RE | Admit: 2012-12-07 | Discharge: 2012-12-07 | Disposition: A | Discharge status: Home / Self Care | Payer: BC Managed Care – PPO | Source: Ambulatory Visit | Attending: Cardiovascular Disease | Admitting: Cardiovascular Disease

## 2012-12-09 ENCOUNTER — Ambulatory Visit (HOSPITAL_COMMUNITY): Payer: BC Managed Care – PPO

## 2012-12-09 ENCOUNTER — Encounter (HOSPITAL_COMMUNITY)
Admission: RE | Admit: 2012-12-09 | Discharge: 2012-12-09 | Disposition: A | Discharge status: Home / Self Care | Payer: BC Managed Care – PPO | Source: Ambulatory Visit | Attending: Cardiovascular Disease | Admitting: Cardiovascular Disease

## 2012-12-11 ENCOUNTER — Ambulatory Visit (HOSPITAL_COMMUNITY): Payer: BC Managed Care – PPO

## 2012-12-11 ENCOUNTER — Encounter (HOSPITAL_COMMUNITY)
Admission: RE | Admit: 2012-12-11 | Discharge: 2012-12-11 | Disposition: A | Discharge status: Home / Self Care | Payer: BC Managed Care – PPO | Source: Ambulatory Visit | Attending: Cardiovascular Disease | Admitting: Cardiovascular Disease

## 2012-12-14 ENCOUNTER — Encounter (HOSPITAL_COMMUNITY)
Admission: RE | Admit: 2012-12-14 | Discharge: 2012-12-14 | Disposition: A | Discharge status: Home / Self Care | Payer: BC Managed Care – PPO | Source: Ambulatory Visit | Attending: Cardiovascular Disease | Admitting: Cardiovascular Disease

## 2012-12-14 ENCOUNTER — Ambulatory Visit (HOSPITAL_COMMUNITY): Payer: BC Managed Care – PPO

## 2012-12-16 ENCOUNTER — Encounter (HOSPITAL_COMMUNITY)
Admission: RE | Admit: 2012-12-16 | Discharge: 2012-12-16 | Disposition: A | Discharge status: Home / Self Care | Payer: BC Managed Care – PPO | Source: Ambulatory Visit | Attending: Cardiovascular Disease | Admitting: Cardiovascular Disease

## 2012-12-16 ENCOUNTER — Ambulatory Visit (HOSPITAL_COMMUNITY): Payer: BC Managed Care – PPO

## 2012-12-18 ENCOUNTER — Ambulatory Visit (HOSPITAL_COMMUNITY): Payer: BC Managed Care – PPO

## 2012-12-18 ENCOUNTER — Encounter (HOSPITAL_COMMUNITY)
Admission: RE | Admit: 2012-12-18 | Discharge: 2012-12-18 | Disposition: A | Discharge status: Home / Self Care | Payer: BC Managed Care – PPO | Source: Ambulatory Visit | Attending: Cardiovascular Disease | Admitting: Cardiovascular Disease

## 2012-12-21 ENCOUNTER — Ambulatory Visit (HOSPITAL_COMMUNITY): Payer: BC Managed Care – PPO

## 2012-12-21 ENCOUNTER — Encounter (HOSPITAL_COMMUNITY)
Admission: RE | Admit: 2012-12-21 | Discharge: 2012-12-21 | Disposition: A | Discharge status: Home / Self Care | Payer: BC Managed Care – PPO | Source: Ambulatory Visit | Attending: Cardiovascular Disease | Admitting: Cardiovascular Disease

## 2012-12-22 ENCOUNTER — Other Ambulatory Visit: Payer: Self-pay | Admitting: *Deleted

## 2012-12-22 MED ORDER — PANTOPRAZOLE SODIUM 40 MG PO TBEC: 40.0000 mg | DELAYED_RELEASE_TABLET | Freq: Every day | ORAL | Status: DC

## 2012-12-23 ENCOUNTER — Encounter (HOSPITAL_COMMUNITY)
Admission: RE | Admit: 2012-12-23 | Discharge: 2012-12-23 | Disposition: A | Discharge status: Home / Self Care | Payer: BC Managed Care – PPO | Source: Ambulatory Visit | Attending: Cardiovascular Disease | Admitting: Cardiovascular Disease

## 2012-12-23 ENCOUNTER — Ambulatory Visit (HOSPITAL_COMMUNITY): Payer: BC Managed Care – PPO

## 2012-12-23 DIAGNOSIS — E119 Type 2 diabetes mellitus without complications: Secondary | ICD-10-CM | POA: Insufficient documentation

## 2012-12-23 DIAGNOSIS — I10 Essential (primary) hypertension: Secondary | ICD-10-CM | POA: Insufficient documentation

## 2012-12-23 DIAGNOSIS — I201 Angina pectoris with documented spasm: Secondary | ICD-10-CM | POA: Insufficient documentation

## 2012-12-23 DIAGNOSIS — I214 Non-ST elevation (NSTEMI) myocardial infarction: Secondary | ICD-10-CM | POA: Insufficient documentation

## 2012-12-23 DIAGNOSIS — Z5189 Encounter for other specified aftercare: Secondary | ICD-10-CM | POA: Insufficient documentation

## 2012-12-25 ENCOUNTER — Ambulatory Visit (HOSPITAL_COMMUNITY): Payer: BC Managed Care – PPO

## 2012-12-25 ENCOUNTER — Encounter (HOSPITAL_COMMUNITY)
Admission: RE | Admit: 2012-12-25 | Discharge: 2012-12-25 | Disposition: A | Discharge status: Home / Self Care | Payer: BC Managed Care – PPO | Source: Ambulatory Visit | Attending: Cardiovascular Disease | Admitting: Cardiovascular Disease

## 2012-12-28 ENCOUNTER — Ambulatory Visit (HOSPITAL_COMMUNITY): Payer: BC Managed Care – PPO

## 2012-12-28 ENCOUNTER — Encounter (HOSPITAL_COMMUNITY)
Admission: RE | Admit: 2012-12-28 | Discharge: 2012-12-28 | Disposition: A | Discharge status: Home / Self Care | Payer: BC Managed Care – PPO | Source: Ambulatory Visit | Attending: Cardiovascular Disease | Admitting: Cardiovascular Disease

## 2012-12-30 ENCOUNTER — Encounter (HOSPITAL_COMMUNITY)
Admission: RE | Admit: 2012-12-30 | Discharge: 2012-12-30 | Disposition: A | Discharge status: Home / Self Care | Payer: BC Managed Care – PPO | Source: Ambulatory Visit | Attending: Cardiovascular Disease | Admitting: Cardiovascular Disease

## 2012-12-30 ENCOUNTER — Ambulatory Visit (HOSPITAL_COMMUNITY): Payer: BC Managed Care – PPO

## 2013-01-01 ENCOUNTER — Telehealth (HOSPITAL_COMMUNITY): Payer: Self-pay | Admitting: Cardiac Rehabilitation

## 2013-01-01 ENCOUNTER — Ambulatory Visit (HOSPITAL_COMMUNITY): Payer: BC Managed Care – PPO

## 2013-01-01 ENCOUNTER — Encounter (HOSPITAL_COMMUNITY): Payer: BC Managed Care – PPO

## 2013-01-01 NOTE — Telephone Encounter (Signed)
Pt left message she will be absent from cardiac rehab today due to death in family

## 2013-01-04 ENCOUNTER — Encounter (HOSPITAL_COMMUNITY)
Admission: RE | Admit: 2013-01-04 | Discharge: 2013-01-04 | Disposition: A | Discharge status: Home / Self Care | Payer: BC Managed Care – PPO | Source: Ambulatory Visit | Attending: Cardiovascular Disease | Admitting: Cardiovascular Disease

## 2013-01-04 ENCOUNTER — Ambulatory Visit (HOSPITAL_COMMUNITY): Payer: BC Managed Care – PPO

## 2013-01-06 ENCOUNTER — Ambulatory Visit (HOSPITAL_COMMUNITY): Payer: BC Managed Care – PPO

## 2013-01-06 ENCOUNTER — Encounter (HOSPITAL_COMMUNITY)
Admission: RE | Admit: 2013-01-06 | Discharge: 2013-01-06 | Disposition: A | Discharge status: Home / Self Care | Payer: BC Managed Care – PPO | Source: Ambulatory Visit | Attending: Cardiovascular Disease | Admitting: Cardiovascular Disease

## 2013-01-08 ENCOUNTER — Ambulatory Visit (HOSPITAL_COMMUNITY): Payer: BC Managed Care – PPO

## 2013-01-08 ENCOUNTER — Encounter (HOSPITAL_COMMUNITY)
Admission: RE | Admit: 2013-01-08 | Discharge: 2013-01-08 | Disposition: A | Discharge status: Home / Self Care | Payer: BC Managed Care – PPO | Source: Ambulatory Visit | Attending: Cardiovascular Disease | Admitting: Cardiovascular Disease

## 2013-01-11 ENCOUNTER — Encounter (HOSPITAL_COMMUNITY)
Admission: RE | Admit: 2013-01-11 | Discharge: 2013-01-11 | Disposition: A | Discharge status: Home / Self Care | Payer: BC Managed Care – PPO | Source: Ambulatory Visit | Attending: Cardiovascular Disease | Admitting: Cardiovascular Disease

## 2013-01-11 ENCOUNTER — Ambulatory Visit (HOSPITAL_COMMUNITY): Payer: BC Managed Care – PPO

## 2013-01-13 ENCOUNTER — Ambulatory Visit (HOSPITAL_COMMUNITY): Payer: BC Managed Care – PPO

## 2013-01-13 ENCOUNTER — Encounter (HOSPITAL_COMMUNITY)
Admission: RE | Admit: 2013-01-13 | Discharge: 2013-01-13 | Disposition: A | Discharge status: Home / Self Care | Payer: BC Managed Care – PPO | Source: Ambulatory Visit | Attending: Cardiovascular Disease | Admitting: Cardiovascular Disease

## 2013-01-13 ENCOUNTER — Telehealth: Payer: Self-pay | Admitting: Cardiovascular Disease

## 2013-01-13 DIAGNOSIS — Z8679 Personal history of other diseases of the circulatory system: Secondary | ICD-10-CM

## 2013-01-13 DIAGNOSIS — I10 Essential (primary) hypertension: Secondary | ICD-10-CM

## 2013-01-13 MED ORDER — AMLODIPINE BESYLATE 5 MG PO TABS: 5.0000 mg | ORAL_TABLET | Freq: Every day | ORAL | Status: DC

## 2013-01-13 NOTE — Telephone Encounter (Signed)
Spoke with pt and gave her instructions from Dr. Clifton James. She does not need new prescription for amlodipine at this time.

## 2013-01-13 NOTE — Telephone Encounter (Signed)
Spoke with pt. She reports blood pressure usually runs low at rehab but then goes back to normal later in the day. She states this AM it was 120/73 and 111/78 prior to rehab. At rehab she reports top number was 98. Does not know bottom number. She states she is not having any chest pain or shortness of breath. She reports she is very tired after rehab today. This is the first time she had felt tired after rehab. Reports having no energy. States she does have a "nagging feeling at the top of her head that she can't explain" that started today. She is asking if OK to take tylenol and Zyrtec for this. I told her this would be OK to take. She reports blood pressure is 96/66 at this time. I have instructed her to remain hydrated. She states she has been eating and drinking. I have asked her to continue to monitor blood pressure and call us in a few days with readings. Will contact cardiac rehab to have recent blood pressure readings faxed to office

## 2013-01-13 NOTE — Telephone Encounter (Signed)
Can we decrease Norvasc to 5 mg po Qdaily. chris

## 2013-01-13 NOTE — Telephone Encounter (Signed)
New problem   Pt takes cardiac rehab 3 times a wk and bp 96/66 and she feels very tired. Please call pt.

## 2013-01-15 ENCOUNTER — Encounter (HOSPITAL_COMMUNITY)
Admission: RE | Admit: 2013-01-15 | Discharge: 2013-01-15 | Disposition: A | Discharge status: Home / Self Care | Payer: BC Managed Care – PPO | Source: Ambulatory Visit | Attending: Cardiovascular Disease | Admitting: Cardiovascular Disease

## 2013-01-15 ENCOUNTER — Ambulatory Visit (HOSPITAL_COMMUNITY): Payer: BC Managed Care – PPO

## 2013-01-18 ENCOUNTER — Encounter (HOSPITAL_COMMUNITY)
Admission: RE | Admit: 2013-01-18 | Discharge: 2013-01-18 | Disposition: A | Discharge status: Home / Self Care | Payer: BC Managed Care – PPO | Source: Ambulatory Visit | Attending: Cardiovascular Disease | Admitting: Cardiovascular Disease

## 2013-01-18 ENCOUNTER — Ambulatory Visit (HOSPITAL_COMMUNITY): Payer: BC Managed Care – PPO

## 2013-01-20 ENCOUNTER — Encounter (HOSPITAL_COMMUNITY)
Admission: RE | Admit: 2013-01-20 | Discharge: 2013-01-20 | Disposition: A | Discharge status: Home / Self Care | Payer: BC Managed Care – PPO | Source: Ambulatory Visit | Attending: Cardiovascular Disease | Admitting: Cardiovascular Disease

## 2013-01-20 ENCOUNTER — Ambulatory Visit (HOSPITAL_COMMUNITY): Payer: BC Managed Care – PPO

## 2013-01-22 ENCOUNTER — Telehealth: Payer: Self-pay | Admitting: Cardiovascular Disease

## 2013-01-22 ENCOUNTER — Encounter (HOSPITAL_COMMUNITY)
Admission: RE | Admit: 2013-01-22 | Discharge: 2013-01-22 | Disposition: A | Discharge status: Home / Self Care | Payer: BC Managed Care – PPO | Source: Ambulatory Visit | Attending: Cardiovascular Disease | Admitting: Cardiovascular Disease

## 2013-01-22 ENCOUNTER — Ambulatory Visit (HOSPITAL_COMMUNITY): Payer: BC Managed Care – PPO

## 2013-01-22 DIAGNOSIS — I10 Essential (primary) hypertension: Secondary | ICD-10-CM | POA: Insufficient documentation

## 2013-01-22 DIAGNOSIS — E119 Type 2 diabetes mellitus without complications: Secondary | ICD-10-CM | POA: Insufficient documentation

## 2013-01-22 DIAGNOSIS — I214 Non-ST elevation (NSTEMI) myocardial infarction: Secondary | ICD-10-CM | POA: Insufficient documentation

## 2013-01-22 DIAGNOSIS — I201 Angina pectoris with documented spasm: Secondary | ICD-10-CM | POA: Insufficient documentation

## 2013-01-22 DIAGNOSIS — Z5189 Encounter for other specified aftercare: Secondary | ICD-10-CM | POA: Insufficient documentation

## 2013-01-22 NOTE — Telephone Encounter (Signed)
Pt is completing cardiac rehab and has joined [employer].  She is able to lift 3 pound weights in rehab & 5 pounds still seems to be a little heavy especially lifting over her head.  She calls b/c she was concerned about weight training plan with [employer] was for her to do 3 sets of 15 at a time. I told her she should continue similar type exercises she was doing in Cardiac rehab & build slowlly.   She agrees with this plan.  She would also like to know if she can stop Plavix.  According to Dr. Gibson Ramp 10/2012 note may stop plavix at 64month OV in June. Pt aware.  She is getting her Plavix monthly & may have enough till next appt.  Will forward to Dr. Clifton James & Barbee Shropshire RN

## 2013-01-22 NOTE — Telephone Encounter (Signed)
New problem    pts has medication/& weight training question

## 2013-01-24 NOTE — Telephone Encounter (Signed)
Agree. cdm 

## 2013-01-25 ENCOUNTER — Ambulatory Visit (HOSPITAL_COMMUNITY): Payer: BC Managed Care – PPO

## 2013-01-25 ENCOUNTER — Encounter (HOSPITAL_COMMUNITY)
Admission: RE | Admit: 2013-01-25 | Discharge: 2013-01-25 | Disposition: A | Discharge status: Home / Self Care | Payer: BC Managed Care – PPO | Source: Ambulatory Visit | Attending: Cardiovascular Disease | Admitting: Cardiovascular Disease

## 2013-01-27 ENCOUNTER — Ambulatory Visit (HOSPITAL_COMMUNITY): Payer: BC Managed Care – PPO

## 2013-01-27 ENCOUNTER — Encounter (HOSPITAL_COMMUNITY)
Admission: RE | Admit: 2013-01-27 | Discharge: 2013-01-27 | Disposition: A | Discharge status: Home / Self Care | Payer: BC Managed Care – PPO | Source: Ambulatory Visit | Attending: Cardiovascular Disease | Admitting: Cardiovascular Disease

## 2013-01-29 ENCOUNTER — Encounter (HOSPITAL_COMMUNITY)
Admission: RE | Admit: 2013-01-29 | Discharge: 2013-01-29 | Disposition: A | Discharge status: Home / Self Care | Payer: BC Managed Care – PPO | Source: Ambulatory Visit | Attending: Cardiovascular Disease | Admitting: Cardiovascular Disease

## 2013-01-29 ENCOUNTER — Encounter: Payer: Self-pay | Admitting: Cardiovascular Disease

## 2013-01-29 ENCOUNTER — Ambulatory Visit (HOSPITAL_COMMUNITY): Payer: BC Managed Care – PPO

## 2013-02-01 ENCOUNTER — Encounter (HOSPITAL_COMMUNITY)
Admission: RE | Admit: 2013-02-01 | Discharge: 2013-02-01 | Disposition: A | Discharge status: Home / Self Care | Payer: BC Managed Care – PPO | Source: Ambulatory Visit | Attending: Cardiovascular Disease | Admitting: Cardiovascular Disease

## 2013-02-01 ENCOUNTER — Ambulatory Visit (HOSPITAL_COMMUNITY): Payer: BC Managed Care – PPO

## 2013-02-01 ENCOUNTER — Encounter (HOSPITAL_COMMUNITY): Payer: Self-pay

## 2013-02-01 NOTE — Progress Notes (Signed)
Pt graduated from cardiac rehab program today.  Medication list reconciled.  PHQ9 score-zero, which is improved  .  Pt has made significant lifestyle changes and should be commended for her success. Pt plans to continue exercise at [employer].

## 2013-02-03 ENCOUNTER — Ambulatory Visit (HOSPITAL_COMMUNITY): Payer: BC Managed Care – PPO

## 2013-02-03 ENCOUNTER — Encounter (HOSPITAL_COMMUNITY): Payer: BC Managed Care – PPO

## 2013-02-05 ENCOUNTER — Encounter (HOSPITAL_COMMUNITY): Payer: BC Managed Care – PPO

## 2013-02-05 ENCOUNTER — Ambulatory Visit (HOSPITAL_COMMUNITY): Payer: BC Managed Care – PPO

## 2013-03-03 ENCOUNTER — Ambulatory Visit (INDEPENDENT_AMBULATORY_CARE_PROVIDER_SITE_OTHER): Payer: BC Managed Care – PPO | Admitting: Cardiovascular Disease

## 2013-03-03 ENCOUNTER — Encounter: Payer: Self-pay | Admitting: Cardiovascular Disease

## 2013-03-03 VITALS — BP 121/80 | HR 90 | Ht 66.0 in | Wt 177.0 lb

## 2013-03-03 DIAGNOSIS — I201 Angina pectoris with documented spasm: Secondary | ICD-10-CM

## 2013-03-03 DIAGNOSIS — I10 Essential (primary) hypertension: Secondary | ICD-10-CM

## 2013-03-03 NOTE — Patient Instructions (Signed)
Your physician wants you to follow-up in:  6 months. You will receive a reminder letter in the mail two months in advance. If you don't receive a letter, please call our office to schedule the follow-up appointment.  Your physician has recommended you make the following change in your medication: Stop clopidogrel after you finish the supply you have

## 2013-03-03 NOTE — Progress Notes (Signed)
History of Present Illness: 57 yo female with history of HTN, HLD, DM, CAD who is here today for cardiac follow up. She had a NSTEMI December 2013 and had a cardiac cath 09/22/12 which showed angiographically normal coronary arteries. It was felt that this could represent coronary vasospasm vs myocarditis. Her LV function was normal. She was last seen in our office February 2014.    She is here today for follow up. She has been feeling well. No chest pain or SOB. She is exercising.   Primary Care Physician: Deatra James  Last Lipid Profile:Lipid Panel     Component Value Date/Time   CHOL 125 09/22/2012 0254   TRIG 127 09/22/2012 0254   HDL 42 09/22/2012 0254   CHOLHDL 3.0 09/22/2012 0254   VLDL 25 09/22/2012 0254   LDLCALC 58 09/22/2012 0254     Past Medical History  Diagnosis Date  . Hypertension   . Diabetes mellitus   . Thyroid disease   . Hypercholesteremia 2011?  . NSTEMI (non-ST elevated myocardial infarction) December 2013    negative cath, normal LV function - felt to be vasospasm  . Anemia     Past Surgical History  Procedure Laterality Date  . Foot surgery    . Thyroid surgery      Partial removed per pt  . Cardiac catheterization  December 2013    normal coronaries and normal LV function    Current Outpatient Prescriptions  Medication Sig Dispense Refill  . acetaminophen (TYLENOL) 325 MG tablet Take 325-650 mg by mouth as needed.      Marland Kitchen amLODipine (NORVASC) 10 MG tablet Take 10 mg by mouth daily.      Marland Kitchen aspirin 81 MG tablet Take 81 mg by mouth daily.      Marland Kitchen CALCIUM PO Take 1 tablet by mouth daily. Chewable calcium      . carvedilol (COREG) 3.125 MG tablet Take 1 tablet (3.125 mg total) by mouth 2 (two) times daily.  180 tablet  3  . clopidogrel (PLAVIX) 75 MG tablet Take 1 tablet (75 mg total) by mouth daily with breakfast.  30 tablet  10  . levothyroxine (SYNTHROID, LEVOTHROID) 75 MCG tablet Take 75 mcg by mouth every morning. Take on an empty stomach        . metFORMIN (GLUCOPHAGE) 1000 MG tablet Take 1,000 mg by mouth 2 (two) times daily with a meal.      . Multiple Vitamin (MULTIVITAMIN WITH MINERALS) TABS Take 1 tablet by mouth daily.      . nitroGLYCERIN (NITROSTAT) 0.4 MG SL tablet Place 1 tablet (0.4 mg total) under the tongue every 5 (five) minutes x 3 doses as needed for chest pain.  30 tablet  4  . pantoprazole (PROTONIX) 40 MG tablet Take 1 tablet (40 mg total) by mouth daily.  90 tablet  3  . simvastatin (ZOCOR) 20 MG tablet Take 20 mg by mouth every evening.      . sitaGLIPtin (JANUVIA) 50 MG tablet Take 50 mg by mouth daily.      . valsartan-hydrochlorothiazide (DIOVAN-HCT) 160-25 MG per tablet Take 1 tablet by mouth daily.       No current facility-administered medications for this visit.    Allergies  Allergen Reactions  . Eggs Or Egg-Derived Products Other (See Comments)    Unknown reaction-patient states she was allergic to egg yolks when she was little. She can eat eggs now, but does not take flu shot or other egg-containing medications.  Marland Kitchen  Penicillins Other (See Comments)    Unknown-reaction as a child   . Pollen Extract     History   Social History  . Marital Status: Divorced    Spouse Name: N/A    Number of Children: N/A  . Years of Education: N/A   Occupational History  . Not on file.   Social History Main Topics  . Smoking status: Former Smoker    Quit date: 11/21/1990  . Smokeless tobacco: Never Used  . Alcohol Use: No  . Drug Use: No  . Sexually Active: Yes   Other Topics Concern  . Not on file   Social History Narrative  . No narrative on file    No family history on file.  Review of Systems:  As stated in the HPI and otherwise negative.   BP 121/80  Pulse 90  Ht 5\' 6"  (1.676 m)  Wt 177 lb (80.287 kg)  BMI 28.58 kg/m2  Physical Examination: General: Well developed, well nourished, NAD HEENT: OP clear, mucus membranes moist SKIN: warm, dry. No rashes. Neuro: No focal  deficits Musculoskeletal: Muscle strength 5/5 all ext Psychiatric: Mood and affect normal Neck: No JVD, no carotid bruits, no thyromegaly, no lymphadenopathy. Lungs:Clear bilaterally, no wheezes, rhonci, crackles Cardiovascular: Regular rate and rhythm. No murmurs, gallops or rubs. Abdomen:Soft. Bowel sounds present. Non-tender.  Extremities: No lower extremity edema. Pulses are 2 + in the bilateral DP/PT.  Assessment and Plan:   1. NSTEMI: Felt to be due to vasospasm - now on Norvasc. No recurrent chest pains. Will d/c Plavix.   2. HTN: BP controlled.   3. DM: She will f/u with primary care.   4. RBBB: Chronic

## 2013-06-07 ENCOUNTER — Other Ambulatory Visit: Payer: Self-pay

## 2013-06-07 ENCOUNTER — Other Ambulatory Visit: Payer: Self-pay | Admitting: Cardiovascular Disease

## 2013-07-29 ENCOUNTER — Other Ambulatory Visit: Payer: Self-pay

## 2013-07-30 ENCOUNTER — Other Ambulatory Visit: Payer: Self-pay | Admitting: *Deleted

## 2013-07-30 MED ORDER — AMLODIPINE BESYLATE 10 MG PO TABS: 10.0000 mg | ORAL_TABLET | Freq: Every day | ORAL | Status: DC

## 2013-09-27 ENCOUNTER — Ambulatory Visit (INDEPENDENT_AMBULATORY_CARE_PROVIDER_SITE_OTHER): Payer: BC Managed Care – PPO | Admitting: Cardiovascular Disease

## 2013-09-27 ENCOUNTER — Encounter: Payer: Self-pay | Admitting: Cardiovascular Disease

## 2013-09-27 VITALS — BP 128/72 | HR 75 | Ht 66.0 in | Wt 173.8 lb

## 2013-09-27 DIAGNOSIS — I201 Angina pectoris with documented spasm: Secondary | ICD-10-CM

## 2013-09-27 DIAGNOSIS — I10 Essential (primary) hypertension: Secondary | ICD-10-CM | POA: Insufficient documentation

## 2013-09-27 DIAGNOSIS — I4510 Unspecified right bundle-branch block: Secondary | ICD-10-CM

## 2013-09-27 MED ORDER — AMLODIPINE BESYLATE 10 MG PO TABS: 10.0000 mg | ORAL_TABLET | Freq: Every day | ORAL | Status: DC

## 2013-09-27 MED ORDER — CARVEDILOL 3.125 MG PO TABS: 3.1250 mg | ORAL_TABLET | Freq: Two times a day (BID) | ORAL | Status: DC

## 2013-09-27 NOTE — Patient Instructions (Signed)
Your physician wants you to follow-up in:  12 months.  You will receive a reminder letter in the mail two months in advance. If you don't receive a letter, please call our office to schedule the follow-up appointment.   

## 2013-09-27 NOTE — Progress Notes (Signed)
History of Present Illness: 58 yo female with history of HTN, HLD, DM who is here today for cardiac follow up. She had a NSTEMI December 2013 and had a cardiac cath 09/22/12 which showed angiographically normal coronary arteries. It was felt that this could represent coronary vasospasm vs myocarditis. Her LV function was normal. She was last seen in our office June 2014.    She is here today for follow up. She has been feeling well. No chest pain or SOB. She is exercising.   Primary Care Physician: Deatra JamesVyvyan Sun  Last Lipid Profile:Lipid Panel     Component Value Date/Time   CHOL 125 09/22/2012 0254   TRIG 127 09/22/2012 0254   HDL 42 09/22/2012 0254   CHOLHDL 3.0 09/22/2012 0254   VLDL 25 09/22/2012 0254   LDLCALC 58 09/22/2012 0254     Past Medical History  Diagnosis Date  . Hypertension   . Diabetes mellitus   . Thyroid disease   . Hypercholesteremia 2011?  . NSTEMI (non-ST elevated myocardial infarction) December 2013    negative cath, normal LV function - felt to be vasospasm  . Anemia     Past Surgical History  Procedure Laterality Date  . Foot surgery    . Thyroid surgery      Partial removed per pt  . Cardiac catheterization  December 2013    normal coronaries and normal LV function    Current Outpatient Prescriptions  Medication Sig Dispense Refill  . ACCU-CHEK SOFTCLIX LANCETS lancets       . acetaminophen (TYLENOL) 325 MG tablet Take 325-650 mg by mouth as needed.      Marland Kitchen. amLODipine (NORVASC) 10 MG tablet Take 1 tablet (10 mg total) by mouth daily.  90 tablet  0  . aspirin 81 MG tablet Take 81 mg by mouth daily.      Marland Kitchen. CALCIUM PO Take 1 tablet by mouth daily. Chewable calcium      . carvedilol (COREG) 3.125 MG tablet Take 1 tablet (3.125 mg total) by mouth 2 (two) times daily.  180 tablet  3  . levothyroxine (SYNTHROID, LEVOTHROID) 75 MCG tablet Take 75 mcg by mouth every morning. Take on an empty stomach      . metFORMIN (GLUCOPHAGE) 1000 MG tablet Take  1,000 mg by mouth 2 (two) times daily with a meal.      . nitroGLYCERIN (NITROSTAT) 0.4 MG SL tablet Place 1 tablet (0.4 mg total) under the tongue every 5 (five) minutes x 3 doses as needed for chest pain.  30 tablet  4  . pantoprazole (PROTONIX) 40 MG tablet Take 1 tablet (40 mg total) by mouth daily.  90 tablet  3  . simvastatin (ZOCOR) 20 MG tablet Take 20 mg by mouth every evening.      . sitaGLIPtin (JANUVIA) 50 MG tablet Take 100 mg by mouth daily.       . valsartan-hydrochlorothiazide (DIOVAN-HCT) 160-25 MG per tablet Take 1 tablet by mouth daily.       No current facility-administered medications for this visit.    Allergies  Allergen Reactions  . Eggs Or Egg-Derived Products Other (See Comments)    Unknown reaction-patient states she was allergic to egg yolks when she was little. She can eat eggs now, but does not take flu shot or other egg-containing medications.  . Penicillins Other (See Comments)    Unknown-reaction as a child   . Pollen Extract     History   Social History  .  Marital Status: Divorced    Spouse Name: N/A    Number of Children: N/A  . Years of Education: N/A   Occupational History  . Not on file.   Social History Main Topics  . Smoking status: Former Smoker    Quit date: 11/21/1990  . Smokeless tobacco: Never Used  . Alcohol Use: No  . Drug Use: No  . Sexual Activity: Yes   Other Topics Concern  . Not on file   Social History Narrative  . No narrative on file    No family history on file.  Review of Systems:  As stated in the HPI and otherwise negative.   BP 128/72  Pulse 75  Ht 5\' 6"  (1.676 m)  Wt 173 lb 12.8 oz (78.835 kg)  BMI 28.07 kg/m2  Physical Examination: General: Well developed, well nourished, NAD HEENT: OP clear, mucus membranes moist SKIN: warm, dry. No rashes. Neuro: No focal deficits Musculoskeletal: Muscle strength 5/5 all ext Psychiatric: Mood and affect normal Neck: No JVD, no carotid bruits, no thyromegaly,  no lymphadenopathy. Lungs:Clear bilaterally, no wheezes, rhonci, crackles Cardiovascular: Regular rate and rhythm. No murmurs, gallops or rubs. Abdomen:Soft. Bowel sounds present. Non-tender.  Extremities: No lower extremity edema. Pulses are 2 + in the bilateral DP/PT.  EKG: NSR, rate 75 bpm. RBBB  Assessment and Plan:   1. Coronary vasospasm: No recent chest pain. She is on Norvasc.    2. HTN: BP controlled. No changes.   3. RBBB: Chronic

## 2013-12-05 ENCOUNTER — Ambulatory Visit (INDEPENDENT_AMBULATORY_CARE_PROVIDER_SITE_OTHER): Payer: BC Managed Care – PPO | Admitting: Family Medicine

## 2013-12-05 VITALS — BP 124/70 | HR 74 | Temp 98.5°F | Resp 16 | Ht 66.0 in | Wt 169.0 lb

## 2013-12-05 DIAGNOSIS — S60419A Abrasion of unspecified finger, initial encounter: Secondary | ICD-10-CM

## 2013-12-05 DIAGNOSIS — M79644 Pain in right finger(s): Secondary | ICD-10-CM

## 2013-12-05 DIAGNOSIS — IMO0002 Reserved for concepts with insufficient information to code with codable children: Secondary | ICD-10-CM

## 2013-12-05 DIAGNOSIS — M79609 Pain in unspecified limb: Secondary | ICD-10-CM

## 2013-12-05 DIAGNOSIS — Z23 Encounter for immunization: Secondary | ICD-10-CM

## 2013-12-05 NOTE — Patient Instructions (Signed)
1.  Keep wound clean and covered during the day. 2.  Wash wound with soap and water daily. 3.  Apply peroxide once daily.   4.  Apply topical antibiotic ointment every morning only. 5. Return for increasing pain, redness, or drainage.

## 2013-12-05 NOTE — Progress Notes (Signed)
Subjective:    Patient ID: Jeanne [name], female    DOB: [birth year], 58 y.o.   MRN: [id number]  HPI  Chief Complaint  Patient presents with  . Finger Injury    right, today, minor laceration   This chart was scribed for Ethelda Chick, MD by Andrew Au, ED Scribe. This patient was seen in room 2 and the patient's care was started at 10:25 PM.  HPI Comments: Jeanne [name] is a 58 y.o. female who presents to the [facility] complaining of a right third digit injury onset 3 hours ago . Pt reports that she pinched finger in closet door. She reports that afterward her finger began to bleed and that she cleaned it with bactroban and applied a band-aid. She report that every now and then her right arm feels funny but denies numbness and tingling.  Pt reports that she is able to move her finger. Pt is unable to recall last TDAP. Pt has h/o MI in 2013. Pt reports family h/o strokes.   Past Medical History  Diagnosis Date  . Hypertension   . Diabetes mellitus   . Thyroid disease   . Hypercholesteremia 2011?  . NSTEMI (non-ST elevated myocardial infarction) December 2013    negative cath, normal LV function - felt to be vasospasm  . Anemia    Allergies  Allergen Reactions  . Eggs Or Egg-Derived Products Other (See Comments)    Unknown reaction-patient states she was allergic to egg yolks when she was little. She can eat eggs now, but does not take flu shot or other egg-containing medications.  . Penicillins Other (See Comments)    Unknown-reaction as a child   . Pollen Extract    Prior to Admission medications   Medication Sig Start Date End Date Taking? Authorizing Provider  ACCU-CHEK SOFTCLIX LANCETS lancets  07/30/13  Yes Historical Provider, MD  acetaminophen (TYLENOL) 325 MG tablet Take 325-650 mg by mouth as needed.   Yes Historical Provider, MD  amLODipine (NORVASC) 10 MG tablet Take 1 tablet (10 mg total) by mouth daily. 09/27/13  Yes Kathleene Hazel, MD  aspirin 81 MG tablet Take 81 mg by mouth daily.   Yes Historical Provider, MD  CALCIUM PO Take 1 tablet by mouth daily. Chewable calcium   Yes Historical Provider, MD  carvedilol (COREG) 3.125 MG tablet Take 1 tablet (3.125 mg total) by mouth 2 (two) times daily. 09/27/13  Yes Kathleene Hazel, MD  levothyroxine (SYNTHROID, LEVOTHROID) 75 MCG tablet Take 75 mcg by mouth every morning. Take on an empty stomach   Yes Historical Provider, MD  metFORMIN (GLUCOPHAGE) 1000 MG tablet Take 1,000 mg by mouth 2 (two) times daily with a meal.   Yes Historical Provider, MD  nitroGLYCERIN (NITROSTAT) 0.4 MG SL tablet Place 1 tablet (0.4 mg total) under the tongue every 5 (five) minutes x 3 doses as needed for chest pain. 09/23/12  Yes Jodelle Gross, NP  pantoprazole (PROTONIX) 40 MG tablet Take 1 tablet (40 mg total) by mouth daily. 12/22/12  Yes Rosalio Macadamia, NP  simvastatin (ZOCOR) 20 MG tablet Take 20 mg by mouth every evening.   Yes Historical Provider, MD  sitaGLIPtin (JANUVIA) 50 MG tablet Take 100 mg by mouth daily.    Yes Historical Provider, MD  valsartan-hydrochlorothiazide (DIOVAN-HCT) 160-25 MG per tablet Take 1 tablet by mouth daily.   Yes Historical Provider, MD    Review of Systems  Musculoskeletal: Negative  for arthralgias, joint swelling and myalgias.  Skin: Positive for wound.       Objective:   Physical Exam  Nursing note and vitals reviewed. Constitutional: She is oriented to person, place, and time. She appears well-developed and well-nourished. No distress.  HENT:  Head: Normocephalic and atraumatic.  Eyes: EOM are normal.  Neck: Neck supple.  Cardiovascular: Normal rate.   Pulmonary/Chest: Effort normal.  Musculoskeletal: Normal range of motion.  Neurological: She is alert and oriented to person, place, and time.  Skin: Skin is warm and dry.  Right hand-Superificial abrasion at lateral aspect of the skin on 3rd digit Full flexion and extension to  DIP Non tender to palpitation to distal  Psychiatric: She has a normal mood and affect. Her behavior is normal.   TDAP ADMINISTERED.    Assessment & Plan:   1. Pain in finger of right hand   2. Abrasion of finger, right   3. Need for prophylactic vaccination with combined diphtheria-tetanus-pertussis (DTP) vaccine     1. Pain R 3rd digit hand: New. Secondary to trauma and abrasion.  Recommend Tylenol PRN. 2.  Abrasion R 3rd finger: New.  Local wound care reviewed; keep clean and covered; wash once daily with soap and water.  RTC for fever, pain, swelling, redness.  S/p TDAP. 3. S/p TDAP.   No orders of the defined types were placed in this encounter.   I personally performed the services described in this documentation, which was scribed in my presence.  The recorded information has been reviewed and is accurate.  Nilda SimmerKristi Smith, M.D.  [facility]  [place] [address] [place], [place]  [id number] [phone] phone [phone] fax

## 2014-09-01 ENCOUNTER — Encounter (HOSPITAL_COMMUNITY): Payer: Self-pay | Admitting: Cardiovascular Disease

## 2014-09-17 ENCOUNTER — Other Ambulatory Visit: Payer: Self-pay | Admitting: Cardiovascular Disease

## 2014-10-02 ENCOUNTER — Other Ambulatory Visit: Payer: Self-pay | Admitting: Cardiovascular Disease

## 2014-10-05 ENCOUNTER — Ambulatory Visit (INDEPENDENT_AMBULATORY_CARE_PROVIDER_SITE_OTHER): Payer: BLUE CROSS/BLUE SHIELD | Admitting: Cardiovascular Disease

## 2014-10-05 ENCOUNTER — Encounter: Payer: Self-pay | Admitting: Cardiovascular Disease

## 2014-10-05 VITALS — BP 110/70 | HR 77 | Ht 66.0 in | Wt 175.8 lb

## 2014-10-05 DIAGNOSIS — I10 Essential (primary) hypertension: Secondary | ICD-10-CM

## 2014-10-05 DIAGNOSIS — I4510 Unspecified right bundle-branch block: Secondary | ICD-10-CM

## 2014-10-05 DIAGNOSIS — I201 Angina pectoris with documented spasm: Secondary | ICD-10-CM

## 2014-10-05 NOTE — Progress Notes (Signed)
History of Present Illness: 59 yo female with history of HTN, HLD, DM who is here today for cardiac follow up. She had a NSTEMI December 2013 and had a cardiac cath 09/22/12 which showed angiographically normal coronary arteries. It was felt that this could represent coronary vasospasm vs myocarditis. Her LV function was normal. She was last seen in our office June 2014.    She is here today for follow up. She has been feeling well. No chest pain or SOB. She is exercising  Primary Care Physician: Deatra JamesVyvyan Sun  Last Lipid Profile: Followed in primary care and her Total chol was 135 per pt   Past Medical History  Diagnosis Date  . Hypertension   . Diabetes mellitus   . Thyroid disease   . Hypercholesteremia 2011?  . NSTEMI (non-ST elevated myocardial infarction) December 2013    negative cath, normal LV function - felt to be vasospasm  . Anemia     Past Surgical History  Procedure Laterality Date  . Foot surgery    . Thyroid surgery      Partial removed per pt  . Cardiac catheterization  December 2013    normal coronaries and normal LV function  . Left heart catheterization with coronary angiogram N/A 09/22/2012    Procedure: LEFT HEART CATHETERIZATION WITH CORONARY ANGIOGRAM;  Surgeon: Tonny BollmanMichael Cooper, MD;  [facility];  Service: Cardiovascular;  Laterality: N/A;    Current Outpatient Prescriptions  Medication Sig Dispense Refill  . acetaminophen (TYLENOL) 325 MG tablet Take 325-650 mg by mouth as needed.    Marland Kitchen. amLODipine (NORVASC) 10 MG tablet Take 1 tablet (10 mg total) by mouth daily. 90 tablet 3  . aspirin 81 MG tablet Take 81 mg by mouth daily.    Marland Kitchen. CALCIUM PO Take 1 tablet by mouth daily. Chewable calcium    . carvedilol (COREG) 3.125 MG tablet TAKE 1 TABLET (3.125 MG TOTAL) BY MOUTH 2 (TWO) TIMES DAILY. 180 tablet 0  . JANUVIA 100 MG tablet Take 100 mg by mouth daily. 100 mg daily by mouth  0  . levothyroxine (SYNTHROID, LEVOTHROID) 75 MCG tablet Take 75 mcg  by mouth every morning. Take on an empty stomach    . metFORMIN (GLUCOPHAGE) 1000 MG tablet Take 1,000 mg by mouth 2 (two) times daily with a meal.    . nitroGLYCERIN (NITROSTAT) 0.4 MG SL tablet Place 1 tablet (0.4 mg total) under the tongue every 5 (five) minutes x 3 doses as needed for chest pain. 30 tablet 4  . ONE TOUCH ULTRA TEST test strip     . pantoprazole (PROTONIX) 40 MG tablet Take 1 tablet (40 mg total) by mouth daily. 90 tablet 3  . simvastatin (ZOCOR) 20 MG tablet Take 20 mg by mouth every evening.    . valsartan-hydrochlorothiazide (DIOVAN-HCT) 160-25 MG per tablet TAKE 1 TABLET BY MOUTH EVERY DAY 90 tablet 0   No current facility-administered medications for this visit.    Allergies  Allergen Reactions  . Eggs Or Egg-Derived Products Other (See Comments)    Unknown reaction-patient states she was allergic to egg yolks when she was little. She can eat eggs now, but does not take flu shot or other egg-containing medications.  . Penicillins Other (See Comments)    Unknown-reaction as a child   . Pollen Extract     History   Social History  . Marital Status: Divorced    Spouse Name: N/A    Number of Children: N/A  .  Years of Education: N/A   Occupational History  . Not on file.   Social History Main Topics  . Smoking status: Former Smoker    Quit date: 11/21/1990  . Smokeless tobacco: Never Used  . Alcohol Use: No  . Drug Use: No  . Sexual Activity: Yes   Other Topics Concern  . Not on file   Social History Narrative    No family history on file.  Review of Systems:  As stated in the HPI and otherwise negative.   BP 110/70 mmHg  Pulse 77  Ht  (1.676 m)  Wt 175 lb 12.8 oz (79.742 kg)  BMI 28.39 kg/m2  SpO2 99%  Physical Examination: General: Well developed, well nourished, NAD HEENT: OP clear, mucus membranes moist SKIN: warm, dry. No rashes. Neuro: No focal deficits Musculoskeletal: Muscle strength 5/5 all ext Psychiatric: Mood and  affect normal Neck: No JVD, no carotid bruits, no thyromegaly, no lymphadenopathy. Lungs:Clear bilaterally, no wheezes, rhonci, crackles Cardiovascular: Regular rate and rhythm. No murmurs, gallops or rubs. Abdomen:Soft. Bowel sounds present. Non-tender.  Extremities: No lower extremity edema. Pulses are 2 + in the bilateral DP/PT.  EKG: NSR, rate 77 bpm. RBBB  Assessment and Plan:   1. Coronary vasospasm: No recent chest pain. She is on Norvasc. Continue Coreg and ASA.    2. HTN: BP controlled. No changes.   3. RBBB: Chronic

## 2014-10-05 NOTE — Patient Instructions (Signed)
Your physician wants you to follow-up in:  12 months.  You will receive a reminder letter in the mail two months in advance. If you don't receive a letter, please call our office to schedule the follow-up appointment.   

## 2015-01-04 ENCOUNTER — Telehealth: Payer: Self-pay | Admitting: Cardiovascular Disease

## 2015-01-04 NOTE — Telephone Encounter (Signed)
Ok to take [employer] '[employer] Us[employer]il  Post procedure. Thanks, chris

## 2015-01-04 NOTE — Telephone Encounter (Signed)
New message        Pt would like to know if it is ok for her to take ibuprofen/advil after surgery

## 2015-01-04 NOTE — Telephone Encounter (Signed)
Patient will be having a dental surgery next Tuesday. The dentist told her she would be getting a prescription for ibuprofen to take afterward. She wanted to know if this would be ok with Dr. [name].  Pt is not on any blood thinners, just baby aspirin. Was told in past not to take ibuprofen or advil--but at that time was on Plavix and aspirin.  Advised patient it is most likely okay to take as prescribed.   She is aware that I am sending to Dr. [name] to inform, and we will call her if he DOES NOT want her to take it. Otherwise, no call back is needed. Pt is appreciative for the information.

## 2015-09-18 ENCOUNTER — Other Ambulatory Visit: Payer: Self-pay | Admitting: Nurse Practitioner

## 2015-11-01 NOTE — Progress Notes (Signed)
Chief Complaint  Patient presents with  . Follow-up  . Hypertension    History of Present Illness: 60 yo female with history of HTN, HLD, DM who is here today for cardiac follow up. She presented with a NSTEMI in December 2013. Cardiac cath 09/22/12 showed angiographically normal coronary arteries. It was felt that this could represent coronary vasospasm vs myocarditis. Her LV function was normal.    She is here today for follow up. She has been feeling well. No chest pain or SOB. She is exercising every day. She retired in December 2016.   Primary Care Physician: Deatra James  Past Medical History  Diagnosis Date  . Hypertension   . Diabetes mellitus   . Thyroid disease   . Hypercholesteremia 2011?  . NSTEMI (non-ST elevated myocardial infarction) [facility]) December 2013    negative cath, normal LV function - felt to be vasospasm  . Anemia     Past Surgical History  Procedure Laterality Date  . Foot surgery    . Thyroid surgery      Partial removed per pt  . Cardiac catheterization  December 2013    normal coronaries and normal LV function  . Left heart catheterization with coronary angiogram N/A 09/22/2012    Procedure: LEFT HEART CATHETERIZATION WITH CORONARY ANGIOGRAM;  Surgeon: Tonny Bollman, MD;  Location: [facility] CATH LAB;  Service: Cardiovascular;  Laterality: N/A;    Current Outpatient Prescriptions  Medication Sig Dispense Refill  . acetaminophen (TYLENOL) 325 MG tablet Take 325-650 mg by mouth as needed.    Marland Kitchen amLODipine (NORVASC) 10 MG tablet Take 1 tablet (10 mg total) by mouth daily. 90 tablet 3  . aspirin 81 MG tablet Take 81 mg by mouth daily.    Marland Kitchen CALCIUM PO Take 1 tablet by mouth daily. Chewable calcium    . carvedilol (COREG) 3.125 MG tablet TAKE 1 TABLET (3.125 MG TOTAL) BY MOUTH 2 (TWO) TIMES DAILY. 180 tablet 0  . JANUVIA 100 MG tablet Take 100 mg by mouth daily. 100 mg daily by mouth  0  . levothyroxine (SYNTHROID, LEVOTHROID) 75 MCG tablet Take 75 mcg by  mouth every morning. Take on an empty stomach    . metFORMIN (GLUCOPHAGE) 1000 MG tablet Take 1,000 mg by mouth 2 (two) times daily with a meal.    . nitroGLYCERIN (NITROSTAT) 0.4 MG SL tablet Place 1 tablet (0.4 mg total) under the tongue every 5 (five) minutes x 3 doses as needed for chest pain. 25 tablet 6  . ONE TOUCH ULTRA TEST test strip     . pantoprazole (PROTONIX) 40 MG tablet TAKE 1 TABLET (40 MG TOTAL) BY MOUTH DAILY. 30 tablet 3  . simvastatin (ZOCOR) 20 MG tablet Take 20 mg by mouth every evening.    . valsartan-hydrochlorothiazide (DIOVAN-HCT) 160-25 MG per tablet TAKE 1 TABLET BY MOUTH EVERY DAY 90 tablet 0   No current facility-administered medications for this visit.    Allergies  Allergen Reactions  . Eggs Or Egg-Derived Products Other (See Comments)    Unknown reaction-patient states she was allergic to egg yolks when she was little. She can eat eggs now, but does not take flu shot or other egg-containing medications.  . Penicillins Other (See Comments)    Unknown-reaction as a child   . Pollen Extract     Social History   Social History  . Marital Status: Divorced    Spouse Name: N/A  . Number of Children: N/A  . Years of Education:  N/A   Occupational History  . Not on file.   Social History Main Topics  . Smoking status: Former Smoker    Quit date: 11/21/1990  . Smokeless tobacco: Never Used  . Alcohol Use: No  . Drug Use: No  . Sexual Activity: Yes   Other Topics Concern  . Not on file   Social History Narrative    No family history on file.  Review of Systems:  As stated in the HPI and otherwise negative.   BP 112/78 mmHg  Pulse 80  Ht  (1.676 m)  Wt 167 lb (75.751 kg)  BMI 26.97 kg/m2  Physical Examination: General: Well developed, well nourished, NAD HEENT: OP clear, mucus membranes moist SKIN: warm, dry. No rashes. Neuro: No focal deficits Musculoskeletal: Muscle strength 5/5 all ext Psychiatric: Mood and affect  normal Neck: No JVD, no carotid bruits, no thyromegaly, no lymphadenopathy. Lungs:Clear bilaterally, no wheezes, rhonci, crackles Cardiovascular: Regular rate and rhythm. No murmurs, gallops or rubs. Abdomen:Soft. Bowel sounds present. Non-tender.  Extremities: No lower extremity edema. Pulses are 2 + in the bilateral DP/PT.  EKG:  EKG is ordered today. The ekg ordered today demonstrates NSR, rate 80 bpm. RBBB  Recent Labs: No results found for requested labs within last 365 days.   Lipid Panel Followed in primary care   Wt Readings from Last 3 Encounters:  11/02/15 167 lb (75.751 kg)  10/05/14 175 lb 12.8 oz (79.742 kg)  12/05/13 169 lb (76.658 kg)     Other studies Reviewed: Additional studies/ records that were reviewed today include: . Review of the above records demonstrates:   Assessment and Plan:   1. Coronary vasospasm: No recent chest pain. She is on Norvasc and Coreg. Continue ASA.  No changes today.   2. HTN: BP controlled. No changes.   3. RBBB: Chronic   Current medicines are reviewed at length with the patient today.  The patient does not have concerns regarding medicines.  The following changes have been made:  no change  Labs/ tests ordered today include:   Orders Placed This Encounter  Procedures  . EKG 12-Lead    Disposition:   FU with me in 12  months  Signed, Verne Carrow, MD 11/02/2015 11:04 AM    [facility] [address] [place], [place], [place]  [address] Phone: [phone]; Fax: [phone]

## 2015-11-02 ENCOUNTER — Encounter: Payer: Self-pay | Admitting: Cardiovascular Disease

## 2015-11-02 ENCOUNTER — Ambulatory Visit (INDEPENDENT_AMBULATORY_CARE_PROVIDER_SITE_OTHER): Payer: BLUE CROSS/BLUE SHIELD | Admitting: Cardiovascular Disease

## 2015-11-02 VITALS — BP 112/78 | HR 80 | Ht 66.0 in | Wt 167.0 lb

## 2015-11-02 DIAGNOSIS — I201 Angina pectoris with documented spasm: Secondary | ICD-10-CM | POA: Diagnosis not present

## 2015-11-02 DIAGNOSIS — I10 Essential (primary) hypertension: Secondary | ICD-10-CM | POA: Diagnosis not present

## 2015-11-02 DIAGNOSIS — I4510 Unspecified right bundle-branch block: Secondary | ICD-10-CM

## 2015-11-02 MED ORDER — NITROGLYCERIN 0.4 MG SL SUBL: 0.4000 mg | SUBLINGUAL_TABLET | SUBLINGUAL | Status: AC | PRN

## 2015-11-02 MED ORDER — NITROGLYCERIN 0.4 MG SL SUBL: 0.4000 mg | SUBLINGUAL_TABLET | SUBLINGUAL | Status: DC | PRN

## 2015-11-02 NOTE — Patient Instructions (Signed)

## 2016-03-13 DIAGNOSIS — E039 Hypothyroidism, unspecified: Secondary | ICD-10-CM | POA: Diagnosis not present

## 2016-03-13 DIAGNOSIS — E119 Type 2 diabetes mellitus without complications: Secondary | ICD-10-CM | POA: Diagnosis not present

## 2016-03-13 DIAGNOSIS — I10 Essential (primary) hypertension: Secondary | ICD-10-CM | POA: Diagnosis not present

## 2016-03-13 DIAGNOSIS — E785 Hyperlipidemia, unspecified: Secondary | ICD-10-CM | POA: Diagnosis not present

## 2016-03-21 DIAGNOSIS — M79604 Pain in right leg: Secondary | ICD-10-CM | POA: Diagnosis not present

## 2016-03-22 ENCOUNTER — Ambulatory Visit
Admission: RE | Admit: 2016-03-22 | Discharge: 2016-03-22 | Disposition: A | Discharge status: Home / Self Care | Payer: BLUE CROSS/BLUE SHIELD | Source: Ambulatory Visit | Attending: Family Medicine | Admitting: Family Medicine

## 2016-03-22 ENCOUNTER — Other Ambulatory Visit: Payer: Self-pay | Admitting: Family Medicine

## 2016-03-22 DIAGNOSIS — M79661 Pain in right lower leg: Secondary | ICD-10-CM | POA: Diagnosis not present

## 2016-03-22 DIAGNOSIS — M79604 Pain in right leg: Secondary | ICD-10-CM

## 2016-03-22 DIAGNOSIS — M19071 Primary osteoarthritis, right ankle and foot: Secondary | ICD-10-CM | POA: Diagnosis not present

## 2016-03-22 DIAGNOSIS — M47816 Spondylosis without myelopathy or radiculopathy, lumbar region: Secondary | ICD-10-CM | POA: Diagnosis not present

## 2016-03-28 DIAGNOSIS — M1611 Unilateral primary osteoarthritis, right hip: Secondary | ICD-10-CM | POA: Diagnosis not present

## 2016-04-01 DIAGNOSIS — M1611 Unilateral primary osteoarthritis, right hip: Secondary | ICD-10-CM | POA: Diagnosis not present

## 2016-04-05 ENCOUNTER — Other Ambulatory Visit: Payer: Self-pay | Admitting: Vascular Surgery

## 2016-04-05 DIAGNOSIS — M545 Low back pain: Secondary | ICD-10-CM | POA: Diagnosis not present

## 2016-04-05 DIAGNOSIS — I700 Atherosclerosis of aorta: Secondary | ICD-10-CM

## 2016-04-08 DIAGNOSIS — M1611 Unilateral primary osteoarthritis, right hip: Secondary | ICD-10-CM | POA: Diagnosis not present

## 2016-04-11 ENCOUNTER — Encounter: Payer: Self-pay | Admitting: Vascular Surgery

## 2016-04-17 ENCOUNTER — Encounter: Payer: BLUE CROSS/BLUE SHIELD | Admitting: Vascular Surgery

## 2016-04-17 ENCOUNTER — Ambulatory Visit (HOSPITAL_COMMUNITY): Payer: BLUE CROSS/BLUE SHIELD

## 2016-04-18 DIAGNOSIS — M545 Low back pain: Secondary | ICD-10-CM | POA: Diagnosis not present

## 2016-04-18 DIAGNOSIS — M5416 Radiculopathy, lumbar region: Secondary | ICD-10-CM | POA: Diagnosis not present

## 2016-05-03 DIAGNOSIS — M545 Low back pain: Secondary | ICD-10-CM | POA: Diagnosis not present

## 2016-05-03 DIAGNOSIS — M5416 Radiculopathy, lumbar region: Secondary | ICD-10-CM | POA: Diagnosis not present

## 2016-05-21 DIAGNOSIS — M5416 Radiculopathy, lumbar region: Secondary | ICD-10-CM | POA: Diagnosis not present

## 2016-05-23 DIAGNOSIS — M5126 Other intervertebral disc displacement, lumbar region: Secondary | ICD-10-CM | POA: Diagnosis not present

## 2016-05-23 DIAGNOSIS — M5416 Radiculopathy, lumbar region: Secondary | ICD-10-CM | POA: Diagnosis not present

## 2016-05-30 DIAGNOSIS — M5126 Other intervertebral disc displacement, lumbar region: Secondary | ICD-10-CM | POA: Diagnosis not present

## 2016-05-30 DIAGNOSIS — M5416 Radiculopathy, lumbar region: Secondary | ICD-10-CM | POA: Diagnosis not present

## 2016-06-06 DIAGNOSIS — M5416 Radiculopathy, lumbar region: Secondary | ICD-10-CM | POA: Diagnosis not present

## 2016-06-06 DIAGNOSIS — M5126 Other intervertebral disc displacement, lumbar region: Secondary | ICD-10-CM | POA: Diagnosis not present

## 2016-06-13 DIAGNOSIS — M5416 Radiculopathy, lumbar region: Secondary | ICD-10-CM | POA: Diagnosis not present

## 2016-06-13 DIAGNOSIS — M5126 Other intervertebral disc displacement, lumbar region: Secondary | ICD-10-CM | POA: Diagnosis not present

## 2016-06-18 DIAGNOSIS — M5416 Radiculopathy, lumbar region: Secondary | ICD-10-CM | POA: Diagnosis not present

## 2016-06-18 DIAGNOSIS — M5126 Other intervertebral disc displacement, lumbar region: Secondary | ICD-10-CM | POA: Diagnosis not present

## 2016-06-24 DIAGNOSIS — Z124 Encounter for screening for malignant neoplasm of cervix: Secondary | ICD-10-CM | POA: Diagnosis not present

## 2016-06-24 DIAGNOSIS — Z1231 Encounter for screening mammogram for malignant neoplasm of breast: Secondary | ICD-10-CM | POA: Diagnosis not present

## 2016-06-24 DIAGNOSIS — Z01419 Encounter for gynecological examination (general) (routine) without abnormal findings: Secondary | ICD-10-CM | POA: Diagnosis not present

## 2016-06-27 DIAGNOSIS — M5126 Other intervertebral disc displacement, lumbar region: Secondary | ICD-10-CM | POA: Diagnosis not present

## 2016-06-27 DIAGNOSIS — M5416 Radiculopathy, lumbar region: Secondary | ICD-10-CM | POA: Diagnosis not present

## 2016-07-04 DIAGNOSIS — M5416 Radiculopathy, lumbar region: Secondary | ICD-10-CM | POA: Diagnosis not present

## 2016-07-04 DIAGNOSIS — M5126 Other intervertebral disc displacement, lumbar region: Secondary | ICD-10-CM | POA: Diagnosis not present

## 2016-07-08 DIAGNOSIS — M545 Low back pain: Secondary | ICD-10-CM | POA: Diagnosis not present

## 2016-07-10 DIAGNOSIS — M5416 Radiculopathy, lumbar region: Secondary | ICD-10-CM | POA: Diagnosis not present

## 2016-07-10 DIAGNOSIS — M5126 Other intervertebral disc displacement, lumbar region: Secondary | ICD-10-CM | POA: Diagnosis not present

## 2016-07-12 DIAGNOSIS — M5126 Other intervertebral disc displacement, lumbar region: Secondary | ICD-10-CM | POA: Diagnosis not present

## 2016-07-12 DIAGNOSIS — M545 Low back pain: Secondary | ICD-10-CM | POA: Diagnosis not present

## 2016-07-12 DIAGNOSIS — M5416 Radiculopathy, lumbar region: Secondary | ICD-10-CM | POA: Diagnosis not present

## 2016-08-13 DIAGNOSIS — M5416 Radiculopathy, lumbar region: Secondary | ICD-10-CM | POA: Diagnosis not present

## 2016-08-13 DIAGNOSIS — M545 Low back pain: Secondary | ICD-10-CM | POA: Diagnosis not present

## 2016-08-13 DIAGNOSIS — M5126 Other intervertebral disc displacement, lumbar region: Secondary | ICD-10-CM | POA: Diagnosis not present

## 2016-09-11 DIAGNOSIS — E039 Hypothyroidism, unspecified: Secondary | ICD-10-CM | POA: Diagnosis not present

## 2016-09-11 DIAGNOSIS — E785 Hyperlipidemia, unspecified: Secondary | ICD-10-CM | POA: Diagnosis not present

## 2016-09-11 DIAGNOSIS — I10 Essential (primary) hypertension: Secondary | ICD-10-CM | POA: Diagnosis not present

## 2016-09-11 DIAGNOSIS — E119 Type 2 diabetes mellitus without complications: Secondary | ICD-10-CM | POA: Diagnosis not present

## 2016-09-21 ENCOUNTER — Other Ambulatory Visit: Payer: Self-pay | Admitting: Nurse Practitioner

## 2016-11-04 DIAGNOSIS — E119 Type 2 diabetes mellitus without complications: Secondary | ICD-10-CM | POA: Diagnosis not present

## 2016-12-18 ENCOUNTER — Ambulatory Visit (INDEPENDENT_AMBULATORY_CARE_PROVIDER_SITE_OTHER): Payer: BLUE CROSS/BLUE SHIELD | Admitting: Cardiovascular Disease

## 2016-12-18 ENCOUNTER — Encounter: Payer: Self-pay | Admitting: Cardiovascular Disease

## 2016-12-18 VITALS — BP 118/76 | HR 90 | Ht 66.0 in | Wt 178.4 lb

## 2016-12-18 DIAGNOSIS — I4510 Unspecified right bundle-branch block: Secondary | ICD-10-CM | POA: Diagnosis not present

## 2016-12-18 DIAGNOSIS — I201 Angina pectoris with documented spasm: Secondary | ICD-10-CM | POA: Diagnosis not present

## 2016-12-18 DIAGNOSIS — I10 Essential (primary) hypertension: Secondary | ICD-10-CM | POA: Diagnosis not present

## 2016-12-18 MED ORDER — AMLODIPINE BESYLATE 5 MG PO TABS: 5.0000 mg | ORAL_TABLET | Freq: Every day | ORAL | 3 refills | Status: DC

## 2016-12-18 NOTE — Progress Notes (Signed)
Chief Complaint  Patient presents with  . Leg Pain    History of Present Illness: 61 yo female with history of HTN, HLD, DM and possible coronary vasospasm who is here today for cardiac follow up. She presented with a NSTEMI in December 2013. Cardiac cath 09/22/12 showed angiographically normal coronary arteries. It was felt that this could represent coronary vasospasm vs myocarditis. Her LV function was normal.    She is here today for follow up. She has been feeling well. No chest pain, dyspnea,  or SOB.   Primary Care Physician: Leanor Rubenstein, MD  Past Medical History:  Diagnosis Date  . Anemia   . Diabetes mellitus   . Hypercholesteremia 2011?  Marland Kitchen Hypertension   . NSTEMI (non-ST elevated myocardial infarction) [facility]) December 2013   negative cath, normal LV function - felt to be vasospasm  . Thyroid disease     Past Surgical History:  Procedure Laterality Date  . CARDIAC CATHETERIZATION  December 2013   normal coronaries and normal LV function  . FOOT SURGERY    . LEFT HEART CATHETERIZATION WITH CORONARY ANGIOGRAM N/A 09/22/2012   Procedure: LEFT HEART CATHETERIZATION WITH CORONARY ANGIOGRAM;  Surgeon: Tonny Bollman, MD;  Location: [facility] CATH LAB;  Service: Cardiovascular;  Laterality: N/A;  . THYROID SURGERY     Partial removed per pt    Current Outpatient Prescriptions  Medication Sig Dispense Refill  . acetaminophen (TYLENOL) 325 MG tablet Take 325-650 mg by mouth as needed.    Marland Kitchen aspirin 81 MG tablet Take 81 mg by mouth daily.    Marland Kitchen CALCIUM PO Take 1 tablet by mouth daily. Chewable calcium    . carvedilol (COREG) 3.125 MG tablet Take 3.125 mg by mouth daily.    Marland Kitchen JANUVIA 100 MG tablet Take 100 mg by mouth daily. 100 mg daily by mouth  0  . levothyroxine (SYNTHROID, LEVOTHROID) 75 MCG tablet Take 75 mcg by mouth every morning. Take on an empty stomach    . metFORMIN (GLUCOPHAGE) 1000 MG tablet Take 1,000 mg by mouth 2 (two) times daily with a meal.    . nitroGLYCERIN  (NITROSTAT) 0.4 MG SL tablet Place 1 tablet (0.4 mg total) under the tongue every 5 (five) minutes x 3 doses as needed for chest pain. 25 tablet 6  . ONE TOUCH ULTRA TEST test strip     . pantoprazole (PROTONIX) 40 MG tablet Take 40 mg by mouth daily as needed (heart burn/indigestion).    . simvastatin (ZOCOR) 20 MG tablet Take 20 mg by mouth every evening.    . valsartan-hydrochlorothiazide (DIOVAN-HCT) 160-25 MG per tablet TAKE 1 TABLET BY MOUTH EVERY DAY 90 tablet 0  . amLODipine (NORVASC) 5 MG tablet Take 1 tablet (5 mg total) by mouth daily. 90 tablet 3   No current facility-administered medications for this visit.     Allergies  Allergen Reactions  . Eggs Or Egg-Derived Products Other (See Comments)    Unknown reaction-patient states she was allergic to egg yolks when she was little. She can eat eggs now, but does not take flu shot or other egg-containing medications.  . Penicillins Other (See Comments)    Unknown-reaction as a child   . Pollen Extract     Social History   Social History  . Marital status: Divorced    Spouse name: N/A  . Number of children: N/A  . Years of education: N/A   Occupational History  . Not on file.   Social History Main  Topics  . Smoking status: Former Smoker    Quit date: 11/21/1990  . Smokeless tobacco: Never Used  . Alcohol use No  . Drug use: No  . Sexual activity: Yes   Other Topics Concern  . Not on file   Social History Narrative  . No narrative on file    History reviewed. No pertinent family history.  Review of Systems:  As stated in the HPI and otherwise negative.   BP 118/76   Pulse 90   Ht 5\' 6"  (1.676 m)   Wt 178 lb 6.4 oz (80.9 kg)   BMI 28.79 kg/m   Physical Examination: General: Well developed, well nourished, NAD  HEENT: OP clear, mucus membranes moist  SKIN: warm, dry. No rashes. Neuro: No focal deficits  Musculoskeletal: Muscle strength 5/5 all ext  Psychiatric: Mood and affect normal  Neck: No JVD, no  carotid bruits, no thyromegaly, no lymphadenopathy.  Lungs:Clear bilaterally, no wheezes, rhonci, crackles Cardiovascular: Regular rate and rhythm. No murmurs, gallops or rubs. Abdomen:Soft. Bowel sounds present. Non-tender.  Extremities: No lower extremity edema. Pulses are 2 + in the bilateral DP/PT.  EKG:  EKG is ordered today. The ekg ordered today demonstrates NSR, rate 90 bpm. RBBB, chronic  Recent Labs: No results found for requested labs within last 8760 hours.   Lipid Panel Followed in primary care   Wt Readings from Last 3 Encounters:  12/18/16 178 lb 6.4 oz (80.9 kg)  11/02/15 167 lb (75.8 kg)  10/05/14 175 lb 12.8 oz (79.7 kg)     Other studies Reviewed: Additional studies/ records that were reviewed today include: . Review of the above records demonstrates:   Assessment and Plan:   1. Coronary vasospasm: She is very active. She has had no chest pain. She is on Norvasc and Coreg. Continue ASA.    2. HTN: BP controlled. No changes.   3. RBBB: Chronic   Current medicines are reviewed at length with the patient today.  The patient does not have concerns regarding medicines.  The following changes have been made:  no change  Labs/ tests ordered today include:   Orders Placed This Encounter  Procedures  . EKG 12-Lead    Disposition:   FU with me in 12  months  Signed, Verne Carrowhristopher Isis Costanza, MD 12/18/2016 9:20 AM    [facility] [address], [place], [place]  [address] Phone: [phone]; Fax: [phone]

## 2016-12-18 NOTE — Patient Instructions (Addendum)
Medication Instructions:   Your physician has recommended you make the following change in your medication:  Decrease Norvasc to 5 mg by mouth daily  Labwork: none  Testing/Procedures: none  Follow-Up: Your physician recommends that you schedule a follow-up appointment in: 12 months. Please call our office in about 9 months to schedule this appointment   Any Other Special Instructions Will Be Listed Below (If Applicable).     If you need a refill on your cardiac medications before your next appointment, please call your pharmacy.

## 2017-01-08 ENCOUNTER — Telehealth: Payer: Self-pay | Admitting: *Deleted

## 2017-01-08 NOTE — Telephone Encounter (Signed)
Patient left a msg on the refill vm stating that when she picked up the carvedilol from the pharmacy it was only prescribed for one tab qd but patient stated that she has always taken this bid. I do not see where an rx was sent in from our office since 2016. I called the patient and she stated that the bottle was at home so she could not verify which physician refilled this for her but she said it could have been her pcp, but even it it was, Dr Clifton James started her on this. Her recent office visit 12/18/16 has it listed as qd but she stated that she was not aware of any changes. Please clarify. Thanks, MI

## 2017-01-09 MED ORDER — CARVEDILOL 3.125 MG PO TABS: 3.1250 mg | ORAL_TABLET | Freq: Two times a day (BID) | ORAL | 3 refills | Status: DC

## 2017-01-09 NOTE — Telephone Encounter (Signed)
I spoke with pt. She reports she had decreased Coreg on her own to once daily.  Dr. Clifton James told her at last office visit she should be taking twice daily.  She is now  taking twice daily.  I told her I would send new prescription to pharmacy with twice daily instructions.   Pt also wanted me to make Dr. Wynelle Link aware so records there would be updated.  Will forward this phone note to Dr. Wynelle Link.

## 2017-01-31 ENCOUNTER — Telehealth: Payer: Self-pay | Admitting: Cardiovascular Disease

## 2017-01-31 NOTE — Telephone Encounter (Signed)
°  New Prob  Pt states her blood pressure dropped to 93/68 after walking a trail with her friend. BP returned to 102/73 after approximately 2 hours. Pt states she is asymptomatic now but would like to speak to a nurse. Pressure this morning 117/74.

## 2017-01-31 NOTE — Telephone Encounter (Signed)
I spoke with pt. She reports yesterday after walking she checked BP and it was 93/68.  She thinks she may have felt like she had low energy so that is why she checked BP.  Denies any chest pain or other complaints.  She drank water and BP improved as listed below.  Today it is fine.  I asked her to check BP over the next several days and let us know if it continues to run lower than normal.  I also advised her to stay hydrated when out walking.

## 2017-03-12 DIAGNOSIS — E039 Hypothyroidism, unspecified: Secondary | ICD-10-CM | POA: Diagnosis not present

## 2017-03-12 DIAGNOSIS — E785 Hyperlipidemia, unspecified: Secondary | ICD-10-CM | POA: Diagnosis not present

## 2017-03-12 DIAGNOSIS — I10 Essential (primary) hypertension: Secondary | ICD-10-CM | POA: Diagnosis not present

## 2017-03-12 DIAGNOSIS — E119 Type 2 diabetes mellitus without complications: Secondary | ICD-10-CM | POA: Diagnosis not present

## 2017-05-01 ENCOUNTER — Other Ambulatory Visit: Payer: Self-pay | Admitting: Cardiovascular Disease

## 2017-05-20 DIAGNOSIS — M5136 Other intervertebral disc degeneration, lumbar region: Secondary | ICD-10-CM | POA: Diagnosis not present

## 2017-05-20 DIAGNOSIS — M9903 Segmental and somatic dysfunction of lumbar region: Secondary | ICD-10-CM | POA: Diagnosis not present

## 2017-05-20 DIAGNOSIS — M9904 Segmental and somatic dysfunction of sacral region: Secondary | ICD-10-CM | POA: Diagnosis not present

## 2017-05-20 DIAGNOSIS — M5137 Other intervertebral disc degeneration, lumbosacral region: Secondary | ICD-10-CM | POA: Diagnosis not present

## 2017-05-21 DIAGNOSIS — M9903 Segmental and somatic dysfunction of lumbar region: Secondary | ICD-10-CM | POA: Diagnosis not present

## 2017-05-21 DIAGNOSIS — M5137 Other intervertebral disc degeneration, lumbosacral region: Secondary | ICD-10-CM | POA: Diagnosis not present

## 2017-05-21 DIAGNOSIS — M5136 Other intervertebral disc degeneration, lumbar region: Secondary | ICD-10-CM | POA: Diagnosis not present

## 2017-05-21 DIAGNOSIS — M9904 Segmental and somatic dysfunction of sacral region: Secondary | ICD-10-CM | POA: Diagnosis not present

## 2017-05-22 DIAGNOSIS — M5137 Other intervertebral disc degeneration, lumbosacral region: Secondary | ICD-10-CM | POA: Diagnosis not present

## 2017-05-22 DIAGNOSIS — M5136 Other intervertebral disc degeneration, lumbar region: Secondary | ICD-10-CM | POA: Diagnosis not present

## 2017-05-22 DIAGNOSIS — M9903 Segmental and somatic dysfunction of lumbar region: Secondary | ICD-10-CM | POA: Diagnosis not present

## 2017-05-22 DIAGNOSIS — M9904 Segmental and somatic dysfunction of sacral region: Secondary | ICD-10-CM | POA: Diagnosis not present

## 2017-05-27 DIAGNOSIS — M5137 Other intervertebral disc degeneration, lumbosacral region: Secondary | ICD-10-CM | POA: Diagnosis not present

## 2017-05-27 DIAGNOSIS — M9904 Segmental and somatic dysfunction of sacral region: Secondary | ICD-10-CM | POA: Diagnosis not present

## 2017-05-27 DIAGNOSIS — M5136 Other intervertebral disc degeneration, lumbar region: Secondary | ICD-10-CM | POA: Diagnosis not present

## 2017-05-27 DIAGNOSIS — M9903 Segmental and somatic dysfunction of lumbar region: Secondary | ICD-10-CM | POA: Diagnosis not present

## 2017-05-28 DIAGNOSIS — M5136 Other intervertebral disc degeneration, lumbar region: Secondary | ICD-10-CM | POA: Diagnosis not present

## 2017-05-28 DIAGNOSIS — M9903 Segmental and somatic dysfunction of lumbar region: Secondary | ICD-10-CM | POA: Diagnosis not present

## 2017-05-28 DIAGNOSIS — M9904 Segmental and somatic dysfunction of sacral region: Secondary | ICD-10-CM | POA: Diagnosis not present

## 2017-05-28 DIAGNOSIS — M5137 Other intervertebral disc degeneration, lumbosacral region: Secondary | ICD-10-CM | POA: Diagnosis not present

## 2017-05-29 DIAGNOSIS — M9904 Segmental and somatic dysfunction of sacral region: Secondary | ICD-10-CM | POA: Diagnosis not present

## 2017-05-29 DIAGNOSIS — M9903 Segmental and somatic dysfunction of lumbar region: Secondary | ICD-10-CM | POA: Diagnosis not present

## 2017-05-29 DIAGNOSIS — M5136 Other intervertebral disc degeneration, lumbar region: Secondary | ICD-10-CM | POA: Diagnosis not present

## 2017-05-29 DIAGNOSIS — M5137 Other intervertebral disc degeneration, lumbosacral region: Secondary | ICD-10-CM | POA: Diagnosis not present

## 2017-06-02 DIAGNOSIS — M9903 Segmental and somatic dysfunction of lumbar region: Secondary | ICD-10-CM | POA: Diagnosis not present

## 2017-06-02 DIAGNOSIS — M5136 Other intervertebral disc degeneration, lumbar region: Secondary | ICD-10-CM | POA: Diagnosis not present

## 2017-06-02 DIAGNOSIS — M5137 Other intervertebral disc degeneration, lumbosacral region: Secondary | ICD-10-CM | POA: Diagnosis not present

## 2017-06-02 DIAGNOSIS — M9904 Segmental and somatic dysfunction of sacral region: Secondary | ICD-10-CM | POA: Diagnosis not present

## 2017-06-04 DIAGNOSIS — M5136 Other intervertebral disc degeneration, lumbar region: Secondary | ICD-10-CM | POA: Diagnosis not present

## 2017-06-04 DIAGNOSIS — M9904 Segmental and somatic dysfunction of sacral region: Secondary | ICD-10-CM | POA: Diagnosis not present

## 2017-06-04 DIAGNOSIS — M9903 Segmental and somatic dysfunction of lumbar region: Secondary | ICD-10-CM | POA: Diagnosis not present

## 2017-06-04 DIAGNOSIS — M5137 Other intervertebral disc degeneration, lumbosacral region: Secondary | ICD-10-CM | POA: Diagnosis not present

## 2017-06-05 DIAGNOSIS — M5137 Other intervertebral disc degeneration, lumbosacral region: Secondary | ICD-10-CM | POA: Diagnosis not present

## 2017-06-05 DIAGNOSIS — M5136 Other intervertebral disc degeneration, lumbar region: Secondary | ICD-10-CM | POA: Diagnosis not present

## 2017-06-05 DIAGNOSIS — M9903 Segmental and somatic dysfunction of lumbar region: Secondary | ICD-10-CM | POA: Diagnosis not present

## 2017-06-05 DIAGNOSIS — M9904 Segmental and somatic dysfunction of sacral region: Secondary | ICD-10-CM | POA: Diagnosis not present

## 2017-06-09 DIAGNOSIS — M5136 Other intervertebral disc degeneration, lumbar region: Secondary | ICD-10-CM | POA: Diagnosis not present

## 2017-06-09 DIAGNOSIS — M9903 Segmental and somatic dysfunction of lumbar region: Secondary | ICD-10-CM | POA: Diagnosis not present

## 2017-06-09 DIAGNOSIS — M5137 Other intervertebral disc degeneration, lumbosacral region: Secondary | ICD-10-CM | POA: Diagnosis not present

## 2017-06-09 DIAGNOSIS — M9904 Segmental and somatic dysfunction of sacral region: Secondary | ICD-10-CM | POA: Diagnosis not present

## 2017-06-11 DIAGNOSIS — M9904 Segmental and somatic dysfunction of sacral region: Secondary | ICD-10-CM | POA: Diagnosis not present

## 2017-06-11 DIAGNOSIS — M5137 Other intervertebral disc degeneration, lumbosacral region: Secondary | ICD-10-CM | POA: Diagnosis not present

## 2017-06-11 DIAGNOSIS — M9903 Segmental and somatic dysfunction of lumbar region: Secondary | ICD-10-CM | POA: Diagnosis not present

## 2017-06-11 DIAGNOSIS — M5136 Other intervertebral disc degeneration, lumbar region: Secondary | ICD-10-CM | POA: Diagnosis not present

## 2017-06-12 DIAGNOSIS — M5136 Other intervertebral disc degeneration, lumbar region: Secondary | ICD-10-CM | POA: Diagnosis not present

## 2017-06-12 DIAGNOSIS — M9903 Segmental and somatic dysfunction of lumbar region: Secondary | ICD-10-CM | POA: Diagnosis not present

## 2017-06-12 DIAGNOSIS — M9904 Segmental and somatic dysfunction of sacral region: Secondary | ICD-10-CM | POA: Diagnosis not present

## 2017-06-12 DIAGNOSIS — M5137 Other intervertebral disc degeneration, lumbosacral region: Secondary | ICD-10-CM | POA: Diagnosis not present

## 2017-06-16 DIAGNOSIS — M9904 Segmental and somatic dysfunction of sacral region: Secondary | ICD-10-CM | POA: Diagnosis not present

## 2017-06-16 DIAGNOSIS — M9903 Segmental and somatic dysfunction of lumbar region: Secondary | ICD-10-CM | POA: Diagnosis not present

## 2017-06-16 DIAGNOSIS — M5137 Other intervertebral disc degeneration, lumbosacral region: Secondary | ICD-10-CM | POA: Diagnosis not present

## 2017-06-16 DIAGNOSIS — M5136 Other intervertebral disc degeneration, lumbar region: Secondary | ICD-10-CM | POA: Diagnosis not present

## 2017-06-19 DIAGNOSIS — M9904 Segmental and somatic dysfunction of sacral region: Secondary | ICD-10-CM | POA: Diagnosis not present

## 2017-06-19 DIAGNOSIS — M5137 Other intervertebral disc degeneration, lumbosacral region: Secondary | ICD-10-CM | POA: Diagnosis not present

## 2017-06-19 DIAGNOSIS — M9903 Segmental and somatic dysfunction of lumbar region: Secondary | ICD-10-CM | POA: Diagnosis not present

## 2017-06-19 DIAGNOSIS — M5136 Other intervertebral disc degeneration, lumbar region: Secondary | ICD-10-CM | POA: Diagnosis not present

## 2017-06-23 DIAGNOSIS — M5136 Other intervertebral disc degeneration, lumbar region: Secondary | ICD-10-CM | POA: Diagnosis not present

## 2017-06-23 DIAGNOSIS — M5137 Other intervertebral disc degeneration, lumbosacral region: Secondary | ICD-10-CM | POA: Diagnosis not present

## 2017-06-23 DIAGNOSIS — M9904 Segmental and somatic dysfunction of sacral region: Secondary | ICD-10-CM | POA: Diagnosis not present

## 2017-06-23 DIAGNOSIS — M9903 Segmental and somatic dysfunction of lumbar region: Secondary | ICD-10-CM | POA: Diagnosis not present

## 2017-06-26 DIAGNOSIS — M9904 Segmental and somatic dysfunction of sacral region: Secondary | ICD-10-CM | POA: Diagnosis not present

## 2017-06-26 DIAGNOSIS — M5136 Other intervertebral disc degeneration, lumbar region: Secondary | ICD-10-CM | POA: Diagnosis not present

## 2017-06-26 DIAGNOSIS — M5137 Other intervertebral disc degeneration, lumbosacral region: Secondary | ICD-10-CM | POA: Diagnosis not present

## 2017-06-26 DIAGNOSIS — M9903 Segmental and somatic dysfunction of lumbar region: Secondary | ICD-10-CM | POA: Diagnosis not present

## 2017-06-30 DIAGNOSIS — M5137 Other intervertebral disc degeneration, lumbosacral region: Secondary | ICD-10-CM | POA: Diagnosis not present

## 2017-06-30 DIAGNOSIS — M9903 Segmental and somatic dysfunction of lumbar region: Secondary | ICD-10-CM | POA: Diagnosis not present

## 2017-06-30 DIAGNOSIS — M5136 Other intervertebral disc degeneration, lumbar region: Secondary | ICD-10-CM | POA: Diagnosis not present

## 2017-06-30 DIAGNOSIS — M9904 Segmental and somatic dysfunction of sacral region: Secondary | ICD-10-CM | POA: Diagnosis not present

## 2017-07-01 DIAGNOSIS — M9903 Segmental and somatic dysfunction of lumbar region: Secondary | ICD-10-CM | POA: Diagnosis not present

## 2017-07-01 DIAGNOSIS — M5137 Other intervertebral disc degeneration, lumbosacral region: Secondary | ICD-10-CM | POA: Diagnosis not present

## 2017-07-01 DIAGNOSIS — M9904 Segmental and somatic dysfunction of sacral region: Secondary | ICD-10-CM | POA: Diagnosis not present

## 2017-07-01 DIAGNOSIS — M5136 Other intervertebral disc degeneration, lumbar region: Secondary | ICD-10-CM | POA: Diagnosis not present

## 2017-07-07 DIAGNOSIS — M9904 Segmental and somatic dysfunction of sacral region: Secondary | ICD-10-CM | POA: Diagnosis not present

## 2017-07-07 DIAGNOSIS — M5136 Other intervertebral disc degeneration, lumbar region: Secondary | ICD-10-CM | POA: Diagnosis not present

## 2017-07-07 DIAGNOSIS — M5137 Other intervertebral disc degeneration, lumbosacral region: Secondary | ICD-10-CM | POA: Diagnosis not present

## 2017-07-07 DIAGNOSIS — M9903 Segmental and somatic dysfunction of lumbar region: Secondary | ICD-10-CM | POA: Diagnosis not present

## 2017-07-09 DIAGNOSIS — Z01419 Encounter for gynecological examination (general) (routine) without abnormal findings: Secondary | ICD-10-CM | POA: Diagnosis not present

## 2017-07-09 DIAGNOSIS — Z1231 Encounter for screening mammogram for malignant neoplasm of breast: Secondary | ICD-10-CM | POA: Diagnosis not present

## 2017-07-10 DIAGNOSIS — M5137 Other intervertebral disc degeneration, lumbosacral region: Secondary | ICD-10-CM | POA: Diagnosis not present

## 2017-07-10 DIAGNOSIS — M9903 Segmental and somatic dysfunction of lumbar region: Secondary | ICD-10-CM | POA: Diagnosis not present

## 2017-07-10 DIAGNOSIS — M9904 Segmental and somatic dysfunction of sacral region: Secondary | ICD-10-CM | POA: Diagnosis not present

## 2017-07-10 DIAGNOSIS — M5136 Other intervertebral disc degeneration, lumbar region: Secondary | ICD-10-CM | POA: Diagnosis not present

## 2017-07-14 DIAGNOSIS — M5136 Other intervertebral disc degeneration, lumbar region: Secondary | ICD-10-CM | POA: Diagnosis not present

## 2017-07-14 DIAGNOSIS — M5137 Other intervertebral disc degeneration, lumbosacral region: Secondary | ICD-10-CM | POA: Diagnosis not present

## 2017-07-14 DIAGNOSIS — M9903 Segmental and somatic dysfunction of lumbar region: Secondary | ICD-10-CM | POA: Diagnosis not present

## 2017-07-14 DIAGNOSIS — M9904 Segmental and somatic dysfunction of sacral region: Secondary | ICD-10-CM | POA: Diagnosis not present

## 2017-07-22 DIAGNOSIS — K219 Gastro-esophageal reflux disease without esophagitis: Secondary | ICD-10-CM | POA: Diagnosis not present

## 2017-07-22 DIAGNOSIS — Z1211 Encounter for screening for malignant neoplasm of colon: Secondary | ICD-10-CM | POA: Diagnosis not present

## 2017-08-20 DIAGNOSIS — Z1211 Encounter for screening for malignant neoplasm of colon: Secondary | ICD-10-CM | POA: Diagnosis not present

## 2017-08-20 LAB — HM COLONOSCOPY

## 2017-09-30 DIAGNOSIS — E039 Hypothyroidism, unspecified: Secondary | ICD-10-CM | POA: Diagnosis not present

## 2017-09-30 DIAGNOSIS — E785 Hyperlipidemia, unspecified: Secondary | ICD-10-CM | POA: Diagnosis not present

## 2017-09-30 DIAGNOSIS — I10 Essential (primary) hypertension: Secondary | ICD-10-CM | POA: Diagnosis not present

## 2017-09-30 DIAGNOSIS — E119 Type 2 diabetes mellitus without complications: Secondary | ICD-10-CM | POA: Diagnosis not present

## 2017-11-05 DIAGNOSIS — E119 Type 2 diabetes mellitus without complications: Secondary | ICD-10-CM | POA: Diagnosis not present

## 2017-11-14 IMAGING — CR DG FEMUR 2+V*R*
4 series · 4 of 4 positions shown · non-contrast
Comparison: No prior.

CLINICAL DATA: Pain radiating down right lower extremity.

EXAM:
RIGHT FEMUR 2 VIEWS

[t femur with hip  ap right]
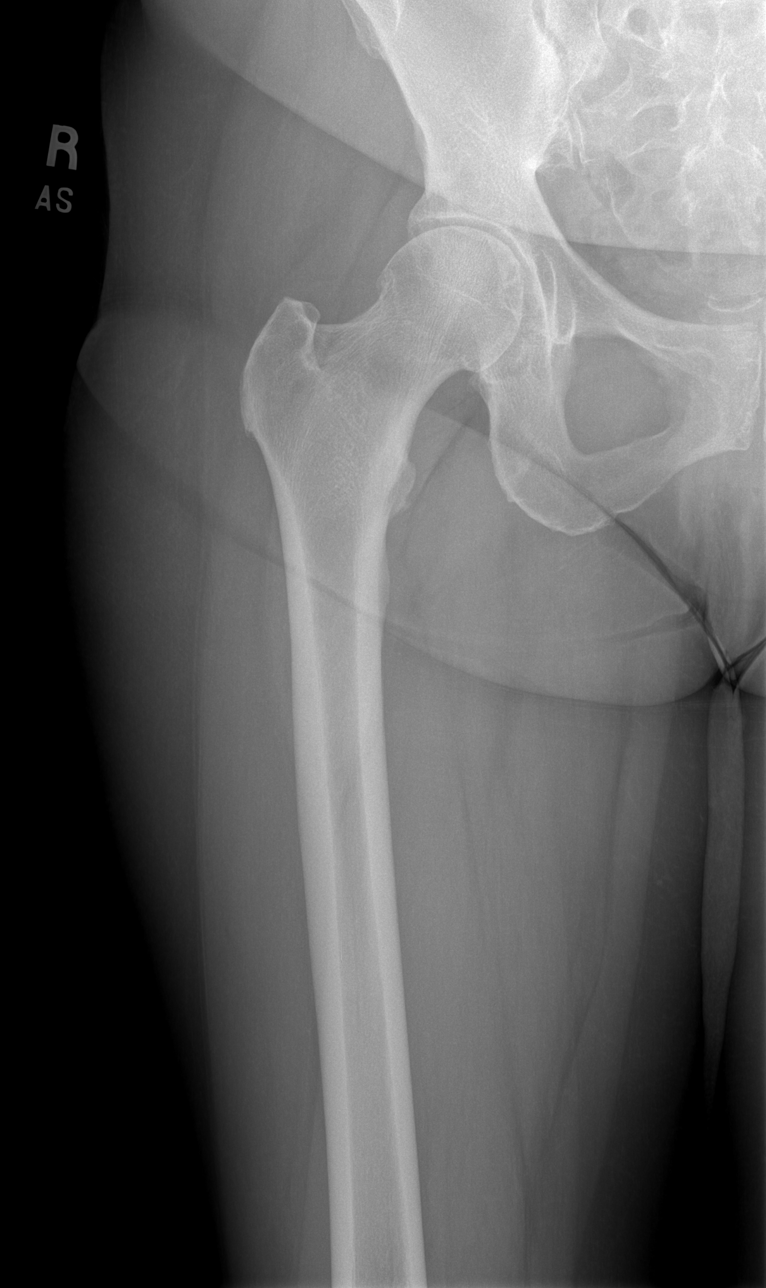

[t femur with knee ap right]
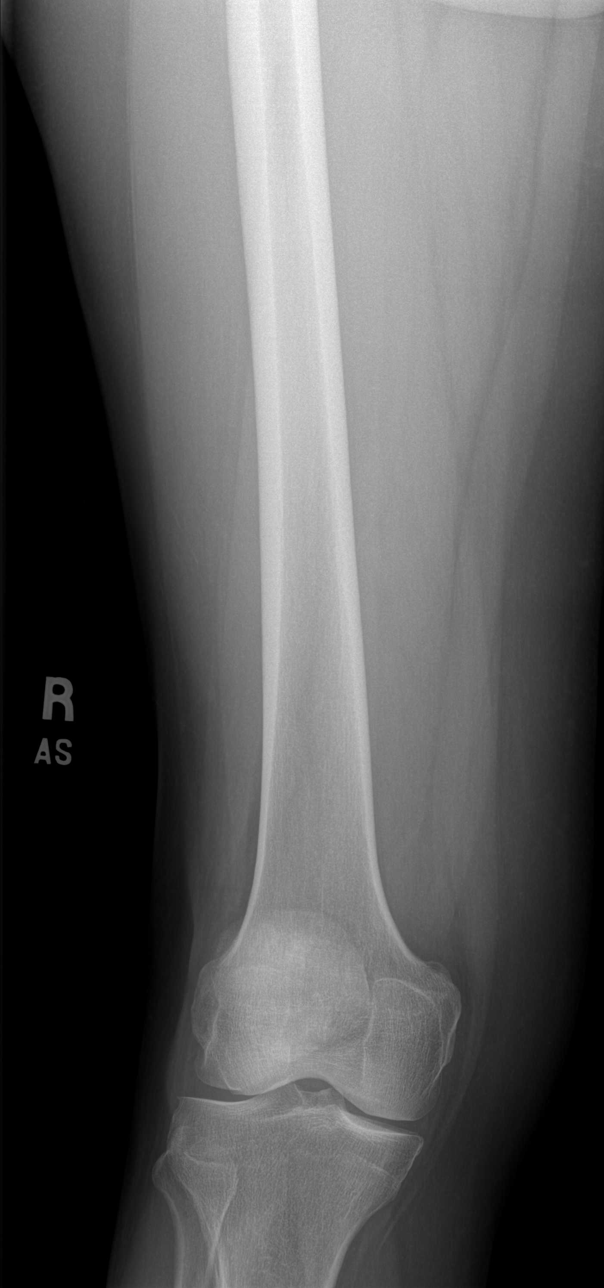

[t femur with hip lat right]
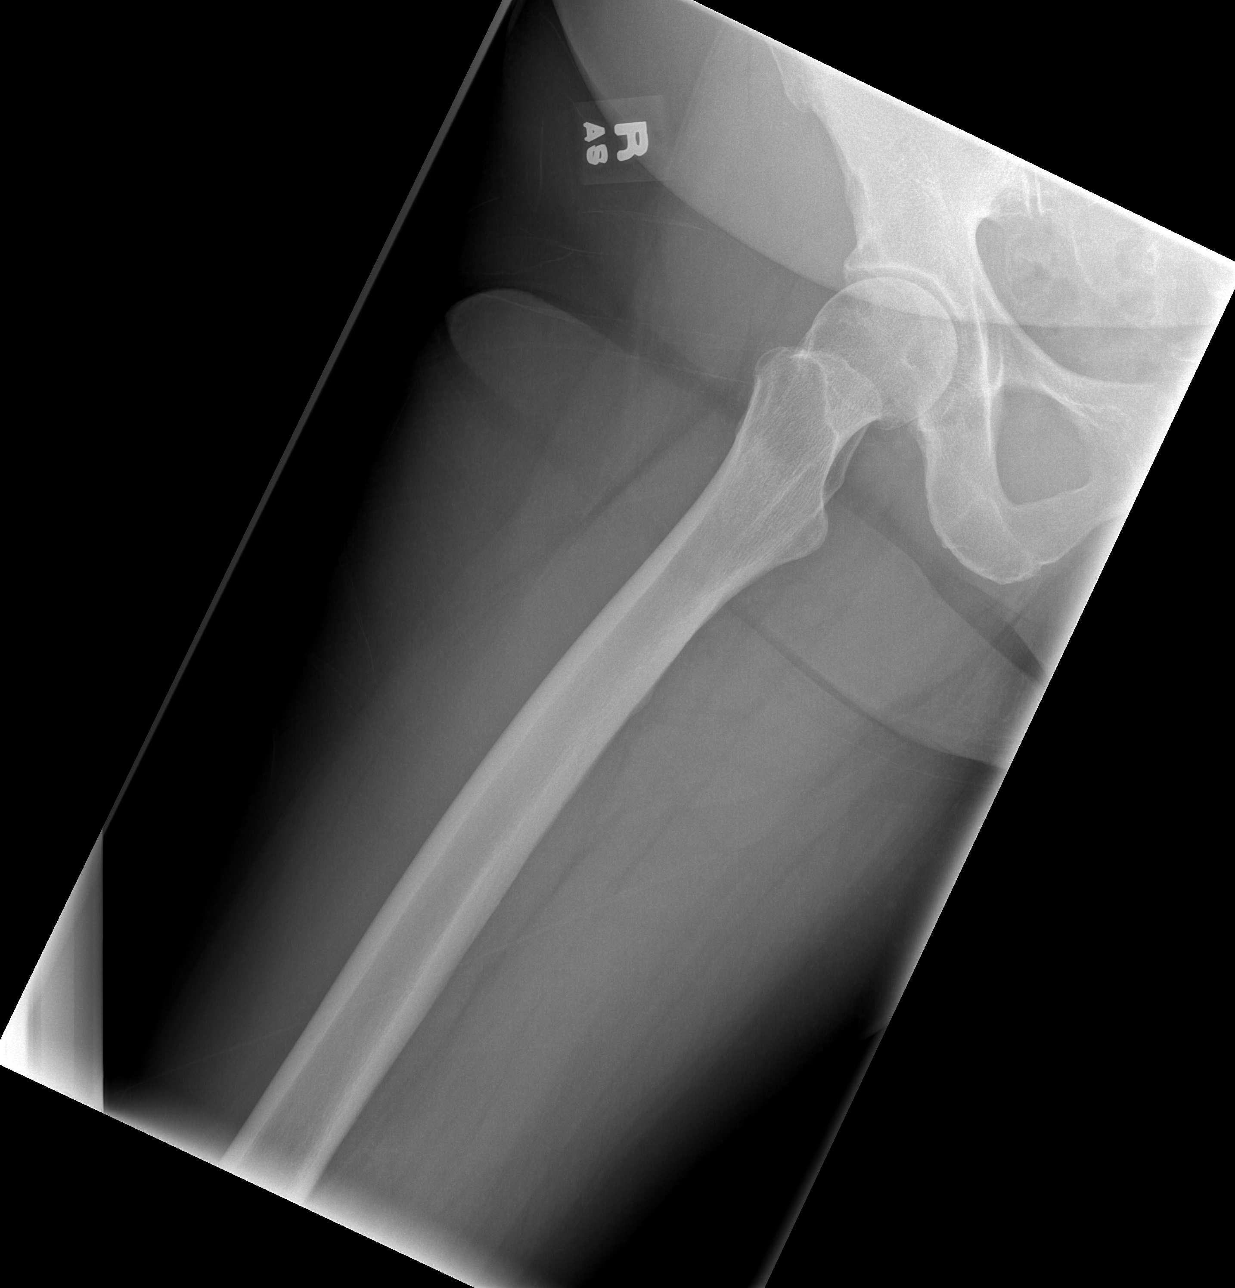

[t femur with knee lat right]
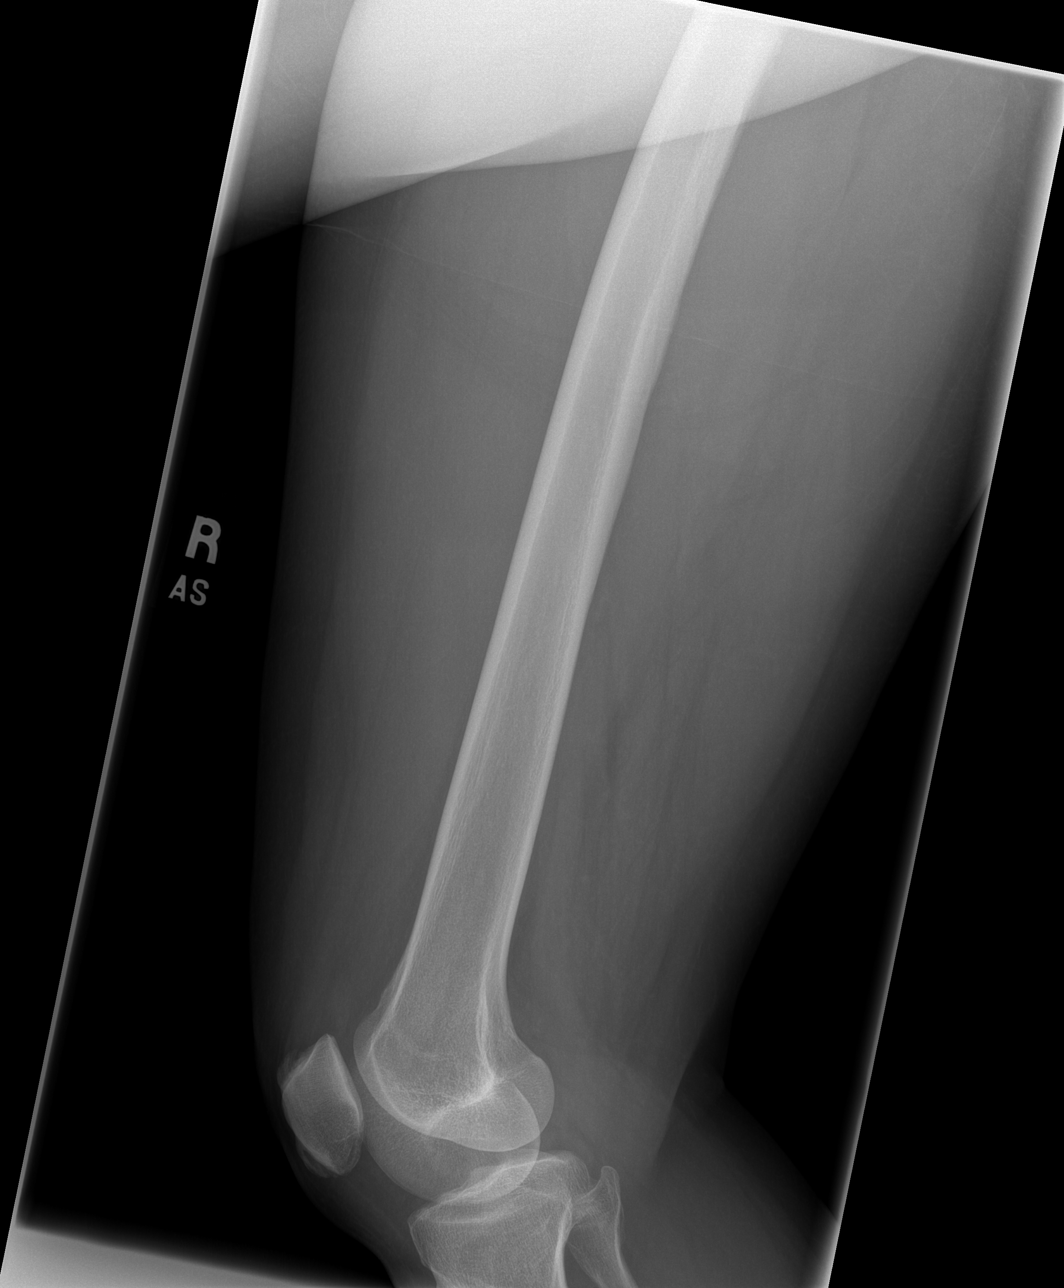

[4 of 4 positions shown; findings below may reference images not displayed]

FINDINGS: No acute bony or joint abnormality identified. Degenerative changes
right hip and knee.
IMPRESSION: No acute or focal abnormality.

## 2017-11-14 IMAGING — CR DG TIBIA/FIBULA 2V*R*
4 series · 4 of 4 positions shown · non-contrast
Comparison: No recent prior.

CLINICAL DATA: Right lower extremity pain. Prior history of fall.
Initial evaluation.

EXAM:
RIGHT TIBIA AND FIBULA - 2 VIEW

[t tib/fib ap right (1 of 2)]
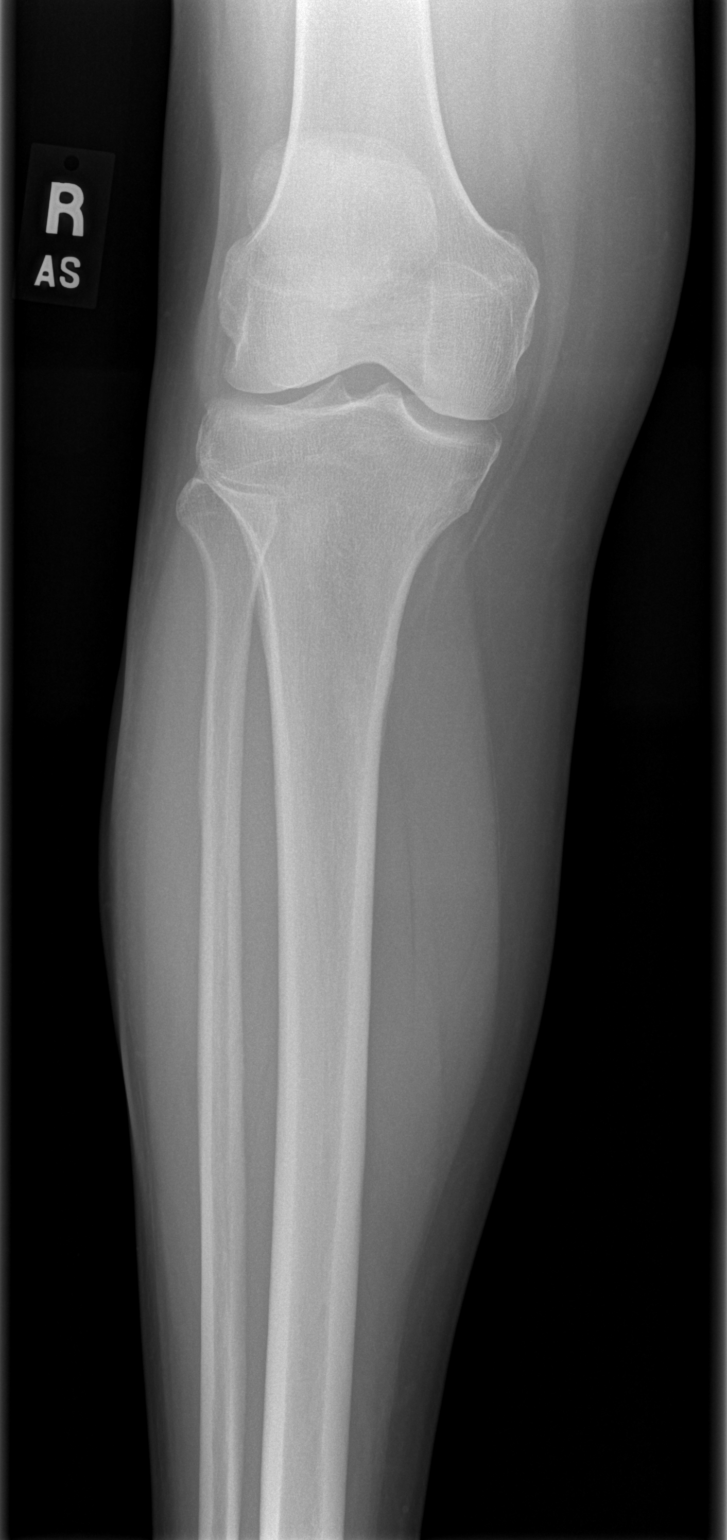

[t tib/fib ap right (2 of 2)]
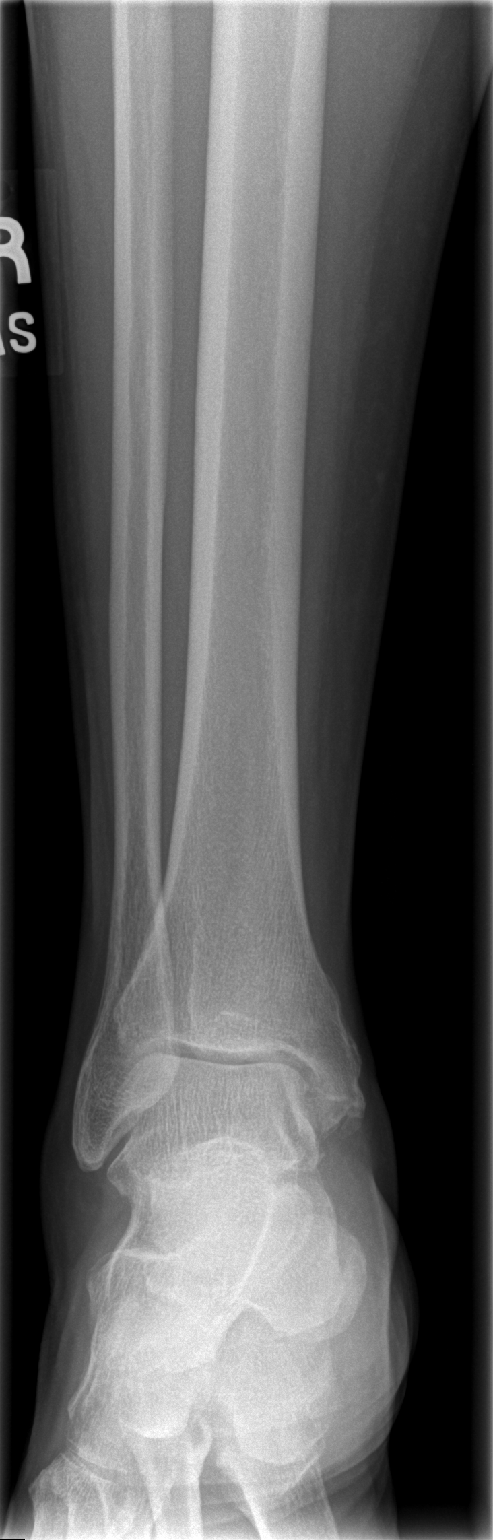

[t tib/fib lat right (1 of 2)]
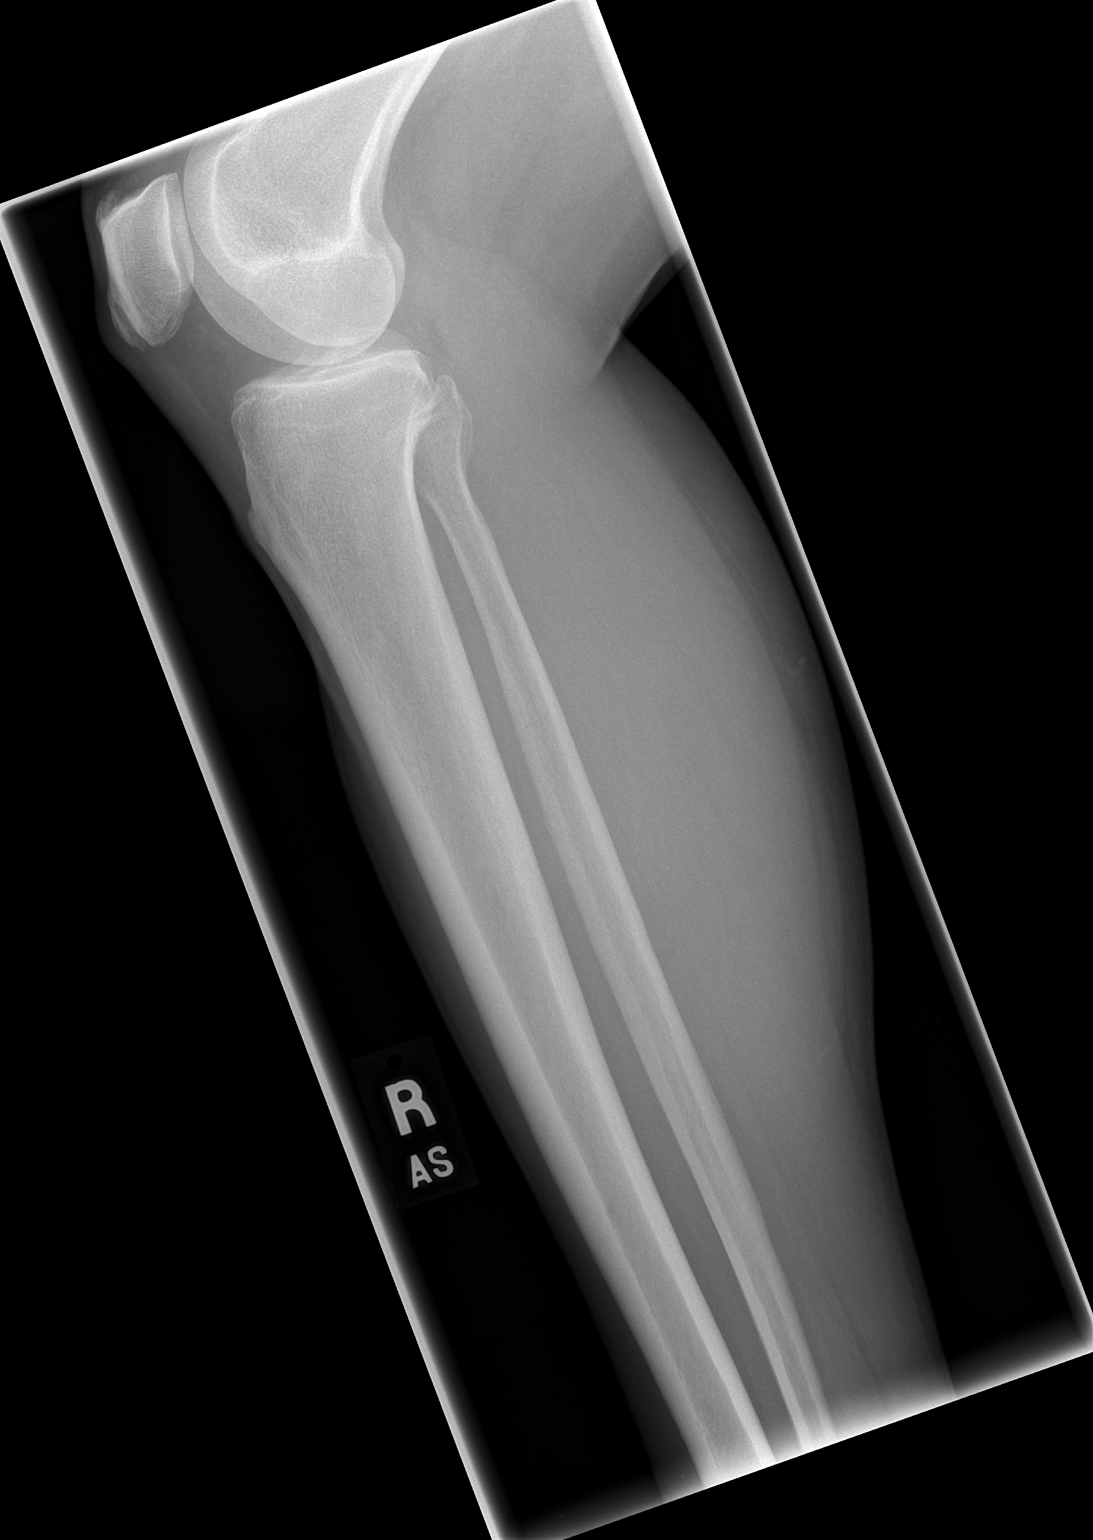

[t tib/fib lat right (2 of 2)]
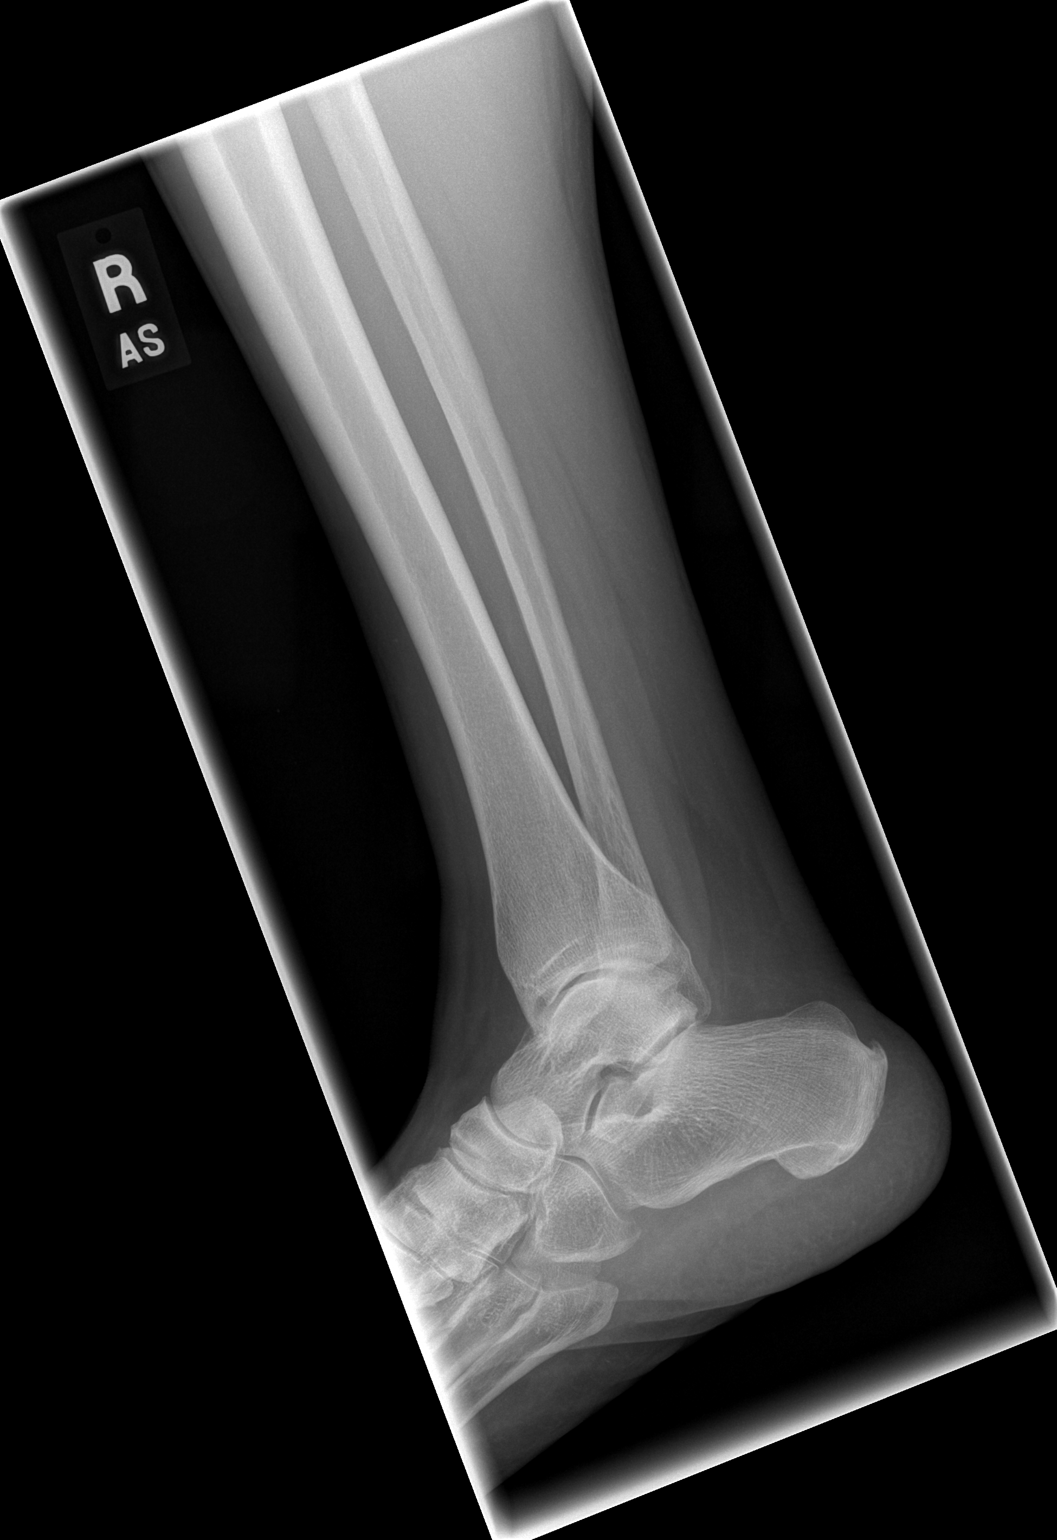

[4 of 4 positions shown; findings below may reference images not displayed]

FINDINGS: Mild degenerative changes right knee and ankle. Corticated bony
density noted adjacent to the medial malleolus and talus most likely
related to old fracture fragments. No acute bony or joint
abnormality identified
IMPRESSION: Mild degenerative changes right knee and ankle. Small corticated
bony densities noted adjacent medial malleolus and talus most likely
related old fracture fragments. No acute abnormality .

## 2018-01-05 ENCOUNTER — Encounter: Payer: Self-pay | Admitting: Cardiovascular Disease

## 2018-01-05 ENCOUNTER — Ambulatory Visit: Payer: BLUE CROSS/BLUE SHIELD | Admitting: Cardiovascular Disease

## 2018-01-05 VITALS — BP 100/70 | HR 85 | Ht 66.0 in | Wt 180.8 lb

## 2018-01-05 DIAGNOSIS — I10 Essential (primary) hypertension: Secondary | ICD-10-CM

## 2018-01-05 DIAGNOSIS — I201 Angina pectoris with documented spasm: Secondary | ICD-10-CM | POA: Diagnosis not present

## 2018-01-05 DIAGNOSIS — I4510 Unspecified right bundle-branch block: Secondary | ICD-10-CM | POA: Diagnosis not present

## 2018-01-05 NOTE — Patient Instructions (Signed)

## 2018-01-05 NOTE — Progress Notes (Signed)
Chief Complaint  Patient presents with  . Hypertension    History of Present Illness: 62 yo female with history of HTN, HLD, DM and possible coronary vasospasm who is here today for cardiac follow up. She presented with a NSTEMI in December 2013. Cardiac cath 09/22/12 showed angiographically normal coronary arteries. It was felt that this could represent coronary vasospasm vs myocarditis. Her LV function was normal.    She is here today for follow up. The patient denies any chest pain, dyspnea, palpitations, lower extremity edema, orthopnea, PND, dizziness, near syncope or syncope.   Primary Care Physician: Deatra JamesSun, Vyvyan, MD  Past Medical History:  Diagnosis Date  . Anemia   . Diabetes mellitus   . Hypercholesteremia 2011?  Marland Kitchen. Hypertension   . NSTEMI (non-ST elevated myocardial infarction) [facility]) December 2013   negative cath, normal LV function - felt to be vasospasm  . Thyroid disease     Past Surgical History:  Procedure Laterality Date  . CARDIAC CATHETERIZATION  December 2013   normal coronaries and normal LV function  . FOOT SURGERY    . LEFT HEART CATHETERIZATION WITH CORONARY ANGIOGRAM N/A 09/22/2012   Procedure: LEFT HEART CATHETERIZATION WITH CORONARY ANGIOGRAM;  Surgeon: Tonny BollmanMichael Cooper, MD;  [facility];  Service: Cardiovascular;  Laterality: N/A;  . THYROID SURGERY     Partial removed per pt    Current Outpatient Medications  Medication Sig Dispense Refill  . acetaminophen (TYLENOL) 325 MG tablet Take 325-650 mg by mouth as needed.    Marland Kitchen. aspirin 81 MG tablet Take 81 mg by mouth daily.    Marland Kitchen. CALCIUM PO Take 1 tablet by mouth daily. Chewable calcium    . JANUVIA 100 MG tablet Take 100 mg by mouth daily. 100 mg daily by mouth  0  . levothyroxine (SYNTHROID, LEVOTHROID) 75 MCG tablet Take 75 mcg by mouth every morning. Take on an empty stomach    . metFORMIN (GLUCOPHAGE) 1000 MG tablet Take 1,000 mg by mouth 2 (two) times daily with a meal.    . nitroGLYCERIN  (NITROSTAT) 0.4 MG SL tablet Place 1 tablet (0.4 mg total) under the tongue every 5 (five) minutes x 3 doses as needed for chest pain. 25 tablet 6  . ONE TOUCH ULTRA TEST test strip     . pantoprazole (PROTONIX) 40 MG tablet Take 40 mg by mouth daily as needed (heart burn/indigestion).    . simvastatin (ZOCOR) 20 MG tablet Take 20 mg by mouth every evening.    . valsartan-hydrochlorothiazide (DIOVAN-HCT) 160-25 MG per tablet TAKE 1 TABLET BY MOUTH EVERY DAY 90 tablet 0  . amLODipine (NORVASC) 5 MG tablet Take 1 tablet (5 mg total) by mouth daily. 90 tablet 3  . carvedilol (COREG) 3.125 MG tablet Take 1 tablet (3.125 mg total) by mouth 2 (two) times daily. 180 tablet 3   No current facility-administered medications for this visit.     Allergies  Allergen Reactions  . Eggs Or Egg-Derived Products Other (See Comments)    Unknown reaction-patient states she was allergic to egg yolks when she was little. She can eat eggs now, but does not take flu shot or other egg-containing medications.  . Penicillins Other (See Comments)    Unknown-reaction as a child   . Pollen Extract     Social History   Socioeconomic History  . Marital status: Divorced    Spouse name: Not on file  . Number of children: Not on file  . Years of  education: Not on file  . Highest education level: Not on file  Occupational History  . Not on file  Social Needs  . Financial resource strain: Not on file  . Food insecurity:    Worry: Not on file    Inability: Not on file  . Transportation needs:    Medical: Not on file    Non-medical: Not on file  Tobacco Use  . Smoking status: Former Smoker    Last attempt to quit: 11/21/1990    Years since quitting: 27.1  . Smokeless tobacco: Never Used  Substance and Sexual Activity  . Alcohol use: No  . Drug use: No  . Sexual activity: Yes  Lifestyle  . Physical activity:    Days per week: Not on file    Minutes per session: Not on file  . Stress: Not on file    Relationships  . Social connections:    Talks on phone: Not on file    Gets together: Not on file    Attends religious service: Not on file    Active member of club or organization: Not on file    Attends meetings of clubs or organizations: Not on file    Relationship status: Not on file  . Intimate partner violence:    Fear of current or ex partner: Not on file    Emotionally abused: Not on file    Physically abused: Not on file    Forced sexual activity: Not on file  Other Topics Concern  . Not on file  Social History Narrative  . Not on file    History reviewed. No pertinent family history.  Review of Systems:  As stated in the HPI and otherwise negative.   BP 100/70   Pulse 85   Ht 5\' 6"  (1.676 m)   Wt 180 lb 12.8 oz (82 kg)   SpO2 97%   BMI 29.18 kg/m   Physical Examination:  General: Well developed, well nourished, NAD  HEENT: OP clear, mucus membranes moist  SKIN: warm, dry. No rashes. Neuro: No focal deficits  Musculoskeletal: Muscle strength 5/5 all ext  Psychiatric: Mood and affect normal  Neck: No JVD, no carotid bruits, no thyromegaly, no lymphadenopathy.  Lungs:Clear bilaterally, no wheezes, rhonci, crackles Cardiovascular: Regular rate and rhythm. No murmurs, gallops or rubs. Abdomen:Soft. Bowel sounds present. Non-tender.  Extremities: No lower extremity edema. Pulses are 2 + in the bilateral DP/PT.   EKG:  EKG is ordered today. The ekg ordered today demonstrates NSR, rate 85 bpm.   Recent Labs: No results found for requested labs within last 8760 hours.   Lipid Panel Followed in primary care   Wt Readings from Last 3 Encounters:  01/05/18 180 lb 12.8 oz (82 kg)  12/18/16 178 lb 6.4 oz (80.9 kg)  11/02/15 167 lb (75.8 kg)     Other studies Reviewed: Additional studies/ records that were reviewed today include: . Review of the above records demonstrates:   Assessment and Plan:   1. Coronary vasospasm: No chest pain. Will continue ASA,  Norvasc and Coreg.   2. HTN: Her BP is well controlled. NO changes today.   3. RBBB: Chronic  Current medicines are reviewed at length with the patient today.  The patient does not have concerns regarding medicines.  The following changes have been made:  no change  Labs/ tests ordered today include:   Orders Placed This Encounter  Procedures  . EKG 12-Lead    Disposition:   FU with  me in 12  months  Signed, Verne Carrow, MD 01/05/2018 10:44 AM    [facility] [address] [place], [place], [place]  [address] Phone: [phone]; Fax: [phone]

## 2018-02-02 ENCOUNTER — Other Ambulatory Visit: Payer: Self-pay

## 2018-02-02 MED ORDER — AMLODIPINE BESYLATE 5 MG PO TABS: 5.0000 mg | ORAL_TABLET | Freq: Every day | ORAL | 1 refills | Status: DC

## 2018-03-07 ENCOUNTER — Other Ambulatory Visit: Payer: Self-pay | Admitting: Cardiovascular Disease

## 2018-03-30 DIAGNOSIS — E785 Hyperlipidemia, unspecified: Secondary | ICD-10-CM | POA: Diagnosis not present

## 2018-03-30 DIAGNOSIS — E1169 Type 2 diabetes mellitus with other specified complication: Secondary | ICD-10-CM | POA: Diagnosis not present

## 2018-03-30 DIAGNOSIS — I10 Essential (primary) hypertension: Secondary | ICD-10-CM | POA: Diagnosis not present

## 2018-03-30 DIAGNOSIS — E039 Hypothyroidism, unspecified: Secondary | ICD-10-CM | POA: Diagnosis not present

## 2018-05-12 ENCOUNTER — Encounter: Payer: Self-pay | Admitting: Family Medicine

## 2018-05-12 ENCOUNTER — Ambulatory Visit: Payer: BLUE CROSS/BLUE SHIELD | Admitting: Family Medicine

## 2018-05-12 VITALS — BP 100/76 | HR 80 | Temp 97.8°F | Ht 65.0 in | Wt 177.0 lb

## 2018-05-12 DIAGNOSIS — E890 Postprocedural hypothyroidism: Secondary | ICD-10-CM

## 2018-05-12 DIAGNOSIS — I10 Essential (primary) hypertension: Secondary | ICD-10-CM

## 2018-05-12 DIAGNOSIS — E782 Mixed hyperlipidemia: Secondary | ICD-10-CM | POA: Diagnosis not present

## 2018-05-12 DIAGNOSIS — E119 Type 2 diabetes mellitus without complications: Secondary | ICD-10-CM | POA: Diagnosis not present

## 2018-05-12 DIAGNOSIS — J302 Other seasonal allergic rhinitis: Secondary | ICD-10-CM

## 2018-05-12 DIAGNOSIS — M1990 Unspecified osteoarthritis, unspecified site: Secondary | ICD-10-CM

## 2018-05-12 DIAGNOSIS — Z7689 Persons encountering health services in other specified circumstances: Secondary | ICD-10-CM

## 2018-05-12 NOTE — Progress Notes (Signed)
Patient presents to clinic today to establish care.  SUBJECTIVE: PMH:Pt is a 62 yo female with pmh sig for arthritis, DM II, HTN, HLD, seasonal allergies, Hypothyroidism, h/o coronary artery spasms.  Pt was previously seen by Deboraha SprangEagle at [facility] on [address]  DM type II: -Taking Januvia 100 mg daily and metformin 500 mg twice daily -Last hemoglobin A1c was 6.9% on 03/30/2018 -checks FSBS at home.  A.m. blood sugar 1 teens. -Pt exercises several times per week including water aerobics, cardio dance class, step class on Saturday. -Pt reads labels and portions her food  HTN: -Checks BP at home typically 1 teens/70s -Taking Coreg 3.25 mg twice daily, Norvasc 5 mg daily, Diovan 160-25 mg daily -Trying to watch her sodium intake  HLD: -Patient tries to limit fried foods -Taking pravastatin 10 mg daily  Seasonal allergies: -Since starting essential oils 1 year ago, denies allergy symptoms.  Hypothyroidism, post operative: -endorses partial thyroid removal in 1994 or 95. -Taking Synthroid 75 MCG daily  Allergies: Penicillin-unsure if allergy as happened as a child.  Past surgical history: -Wisdom teeth extraction 1979 Tubal ligation 1994 Partial thyroidectomy 1994/5 Bunionectomy/hammertoe correction 2010 C-section 1993  Social history: Patient is single.  She is retired.  Pt has 3 children including a set of twin boys.  Patient denies alcohol tobacco, drug use.  Patient is a former smoker.  She smoked from [phone], 1 pack/day.   Health Maintenance: Dental --Dr. Kenney Housemananya Redd [facility] --Heather Burundiman Immunizations --tetanus shot 2016.  Patient does not get influenza vaccines. Colonoscopy --2018 Mammogram --2018 PAP --2017    Past Medical History:  Diagnosis Date  . Anemia   . Arthritis   . Diabetes mellitus   . Hypercholesteremia 2011?  Marland Kitchen. Hypertension   . NSTEMI (non-ST elevated myocardial infarction) [facility](HCC) December 2013   negative cath, normal LV function - felt to be  vasospasm  . Thyroid disease     Past Surgical History:  Procedure Laterality Date  . CARDIAC CATHETERIZATION  December 2013   normal coronaries and normal LV function  . FOOT SURGERY    . LEFT HEART CATHETERIZATION WITH CORONARY ANGIOGRAM N/A 09/22/2012   Procedure: LEFT HEART CATHETERIZATION WITH CORONARY ANGIOGRAM;  Surgeon: Tonny BollmanMichael Cooper, MD;  [facility];  Service: Cardiovascular;  Laterality: N/A;  . THYROID SURGERY     Partial removed per pt    Current Outpatient Medications on File Prior to Visit  Medication Sig Dispense Refill  . acetaminophen (TYLENOL) 325 MG tablet Take 325-650 mg by mouth as needed.    Marland Kitchen. amLODipine (NORVASC) 5 MG tablet Take 1 tablet (5 mg total) by mouth daily. 90 tablet 1  . aspirin 81 MG tablet Take 81 mg by mouth daily.    Marland Kitchen. CALCIUM PO Take 1 tablet by mouth daily. Chewable calcium    . carvedilol (COREG) 3.125 MG tablet TAKE 1 TABLET BY MOUTH TWICE A DAY 180 tablet 2  . JANUVIA 100 MG tablet Take 100 mg by mouth daily. 100 mg daily by mouth  0  . levothyroxine (SYNTHROID, LEVOTHROID) 75 MCG tablet Take 75 mcg by mouth every morning. Take on an empty stomach    . metFORMIN (GLUCOPHAGE) 1000 MG tablet Take 1,000 mg by mouth 2 (two) times daily with a meal.    . nitroGLYCERIN (NITROSTAT) 0.4 MG SL tablet Place 1 tablet (0.4 mg total) under the tongue every 5 (five) minutes x 3 doses as needed for chest pain. 25 tablet 6  .  ONE TOUCH ULTRA TEST test strip     . pantoprazole (PROTONIX) 40 MG tablet Take 40 mg by mouth daily as needed (heart burn/indigestion).    . pravastatin (PRAVACHOL) 10 MG tablet Take 10 mg by mouth daily.    . valsartan-hydrochlorothiazide (DIOVAN-HCT) 160-25 MG per tablet TAKE 1 TABLET BY MOUTH EVERY DAY 90 tablet 0   No current facility-administered medications on file prior to visit.     Allergies  Allergen Reactions  . Eggs Or Egg-Derived Products Other (See Comments)    Unknown reaction-patient states she was  allergic to egg yolks when she was little. She can eat eggs now, but does not take flu shot or other egg-containing medications.  . Penicillins Other (See Comments)    Unknown-reaction as a child   . Pollen Extract     History reviewed. No pertinent family history.  Social History   Socioeconomic History  . Marital status: Divorced    Spouse name: Not on file  . Number of children: Not on file  . Years of education: Not on file  . Highest education level: Not on file  Occupational History  . Not on file  Social Needs  . Financial resource strain: Not on file  . Food insecurity:    Worry: Not on file    Inability: Not on file  . Transportation needs:    Medical: Not on file    Non-medical: Not on file  Tobacco Use  . Smoking status: Former Smoker    Last attempt to quit: 11/21/1990    Years since quitting: 27.4  . Smokeless tobacco: Never Used  Substance and Sexual Activity  . Alcohol use: No  . Drug use: No  . Sexual activity: Yes  Lifestyle  . Physical activity:    Days per week: Not on file    Minutes per session: Not on file  . Stress: Not on file  Relationships  . Social connections:    Talks on phone: Not on file    Gets together: Not on file    Attends religious service: Not on file    Active member of club or organization: Not on file    Attends meetings of clubs or organizations: Not on file    Relationship status: Not on file  . Intimate partner violence:    Fear of current or ex partner: Not on file    Emotionally abused: Not on file    Physically abused: Not on file    Forced sexual activity: Not on file  Other Topics Concern  . Not on file  Social History Narrative  . Not on file    ROS General: Denies fever, chills, night sweats, changes in weight, changes in appetite HEENT: Denies headaches, ear pain, changes in vision, rhinorrhea, sore throat CV: Denies CP, palpitations, SOB, orthopnea Pulm: Denies SOB, cough, wheezing GI: Denies  abdominal pain, nausea, vomiting, diarrhea, constipation GU: Denies dysuria, hematuria, frequency, vaginal discharge Msk: Denies muscle cramps  + joint pains Neuro: Denies weakness, numbness, tingling Skin: Denies rashes, bruising Psych: Denies depression, anxiety, hallucinations  BP 100/76 (BP Location: Left Arm, Patient Position: Sitting, Cuff Size: Large)   Pulse 80   Temp 97.8 F (36.6 C) (Oral)   Ht 5\' 5"  (1.651 m)   Wt 177 lb (80.3 kg)   SpO2 98%   BMI 29.45 kg/m   Physical Exam Gen. Pleasant, well developed, well-nourished, in NAD HEENT - [place]/AT, PERRL, no scleral icterus, no nasal drainage, pharynx without erythema  or exudate.  Lungs: no use of accessory muscles, CTAB, no wheezes, rales or rhonchi Cardiovascular: RRR, No r/g/m, no peripheral edema Abdomen: BS present, soft, nontender, nondistended Neuro:  A&Ox3, CN II-XII intact, normal gait Skin:  Warm, dry, intact, no lesions  No results found for this or any previous visit (from the past 2160 hour(s)).  Assessment/Plan: Controlled type 2 diabetes mellitus without complication, without long-term current use of insulin (HCC) -Controlled -Hemoglobin A1c 6.9% on 03/30/2018 -Continue lifestyle modifications -Continue checking FS BS at home -Continue Januvia 100 mg daily and metformin 500 mg twice daily -Diabetic eye exam up-to-date done 11/05/2017.  No retinopathy noted. -Last foot exam normal.  Recheck in January 2020.  Arthritis -Discussed various ways to treat arthritis pain including water aerobics.  Essential hypertension -Controlled -Continue Coreg 3.125 twice daily, Diovan 160-25 mg daily Norvasc 5 mg daily -Lifestyle modifications encouraged -Patient to continue checking BP at home  Mixed hyperlipidemia -Continue lifestyle modifications and essential oils. -LDL elevated at 133 on lipid panel from 03/30/2018 -Continue pravastatin 10 mg daily consider increasing to 20 mg.  Seasonal allergies -Continue  essential oils  Postoperative hypothyroidism -Continue levothyroxine 75 mcg daily -TSH 0.79 on 03/30/2018.  Encounter to establish care -We reviewed the PMH, PSH, FH, SH, Meds and Allergies. -We provided refills for any medications we will prescribe as needed. -We addressed current concerns per orders and patient instructions. -We have asked for records for pertinent exams, studies, vaccines and notes from previous providers. -We have advised patient to follow up per instructions below.   Follow-up in the next 3 months  Abbe AmsterdamShannon Banks, MD

## 2018-05-12 NOTE — Patient Instructions (Signed)
Diabetes Mellitus and Exercise Exercising regularly is important for your overall health, especially when you have diabetes (diabetes mellitus). Exercising is not only about losing weight. It has many health benefits, such as increasing muscle strength and bone density and reducing body fat and stress. This leads to improved fitness, flexibility, and endurance, all of which result in better overall health. Exercise has additional benefits for people with diabetes, including:  Reducing appetite.  Helping to lower and control blood glucose.  Lowering blood pressure.  Helping to control amounts of fatty substances (lipids) in the blood, such as cholesterol and triglycerides.  Helping the body to respond better to insulin (improving insulin sensitivity).  Reducing how much insulin the body needs.  Decreasing the risk for heart disease by: ? Lowering cholesterol and triglyceride levels. ? Increasing the levels of good cholesterol. ? Lowering blood glucose levels.  What is my activity plan? Your health care provider or certified diabetes educator can help you make a plan for the type and frequency of exercise (activity plan) that works for you. Make sure that you:  Do at least 150 minutes of moderate-intensity or vigorous-intensity exercise each week. This could be brisk walking, biking, or water aerobics. ? Do stretching and strength exercises, such as yoga or weightlifting, at least 2 times a week. ? Spread out your activity over at least 3 days of the week.  Get some form of physical activity every day. ? Do not go more than 2 days in a row without some kind of physical activity. ? Avoid being inactive for more than 90 minutes at a time. Take frequent breaks to walk or stretch.  Choose a type of exercise or activity that you enjoy, and set realistic goals.  Start slowly, and gradually increase the intensity of your exercise over time.  What do I need to know about managing my  diabetes?  Check your blood glucose before and after exercising. ? If your blood glucose is higher than 240 mg/dL (13.3 mmol/L) before you exercise, check your urine for ketones. If you have ketones in your urine, do not exercise until your blood glucose returns to normal.  Know the symptoms of low blood glucose (hypoglycemia) and how to treat it. Your risk for hypoglycemia increases during and after exercise. Common symptoms of hypoglycemia can include: ? Hunger. ? Anxiety. ? Sweating and feeling clammy. ? Confusion. ? Dizziness or feeling light-headed. ? Increased heart rate or palpitations. ? Blurry vision. ? Tingling or numbness around the mouth, lips, or tongue. ? Tremors or shakes. ? Irritability.  Keep a rapid-acting carbohydrate snack available before, during, and after exercise to help prevent or treat hypoglycemia.  Avoid injecting insulin into areas of the body that are going to be exercised. For example, avoid injecting insulin into: ? The arms, when playing tennis. ? The legs, when jogging.  Keep records of your exercise habits. Doing this can help you and your health care provider adjust your diabetes management plan as needed. Write down: ? Food that you eat before and after you exercise. ? Blood glucose levels before and after you exercise. ? The type and amount of exercise you have done. ? When your insulin is expected to peak, if you use insulin. Avoid exercising at times when your insulin is peaking.  When you start a new exercise or activity, work with your health care provider to make sure the activity is safe for you, and to adjust your insulin, medicines, or food intake as needed.    Drink plenty of water while you exercise to prevent dehydration or heat stroke. Drink enough fluid to keep your urine clear or pale yellow. This information is not intended to replace advice given to you by your health care provider. Make sure you discuss any questions you have with  your health care provider. Document Released: 11/30/2003 Document Revised: 03/29/2016 Document Reviewed: 02/19/2016 Elsevier Interactive Patient Education  2018 [place] Eating Plan DASH stands for "Dietary Approaches to Stop Hypertension." The DASH eating plan is a healthy eating plan that has been shown to reduce high blood pressure (hypertension). It may also reduce your risk for type 2 diabetes, heart disease, and stroke. The DASH eating plan may also help with weight loss. What are tips for following this plan? General guidelines  Avoid eating more than 2,300 mg (milligrams) of salt (sodium) a day. If you have hypertension, you may need to reduce your sodium intake to 1,500 mg a day.  Limit alcohol intake to no more than 1 drink a day for nonpregnant women and 2 drinks a day for men. One drink equals 12 oz of beer, 5 oz of wine, or 1 oz of hard liquor.  Work with your health care provider to maintain a healthy body weight or to lose weight. Ask what an ideal weight is for you.  Get at least 30 minutes of exercise that causes your heart to beat faster (aerobic exercise) most days of the week. Activities may include walking, swimming, or biking.  Work with your health care provider or diet and nutrition specialist (dietitian) to adjust your eating plan to your individual calorie needs. Reading food labels  Check food labels for the amount of sodium per serving. Choose foods with less than 5 percent of the Daily Value of sodium. Generally, foods with less than 300 mg of sodium per serving fit into this eating plan.  To find whole grains, look for the word "whole" as the first word in the ingredient list. Shopping  Buy products labeled as "low-sodium" or "no salt added."  Buy fresh foods. Avoid canned foods and premade or frozen meals. Cooking  Avoid adding salt when cooking. Use salt-free seasonings or herbs instead of table salt or sea salt. Check with your health care  provider or pharmacist before using salt substitutes.  Do not fry foods. Cook foods using healthy methods such as baking, boiling, grilling, and broiling instead.  Cook with heart-healthy oils, such as olive, canola, soybean, or sunflower oil. Meal planning   Eat a balanced diet that includes: ? 5 or more servings of fruits and vegetables each day. At each meal, try to fill half of your plate with fruits and vegetables. ? Up to 6-8 servings of whole grains each day. ? Less than 6 oz of lean meat, poultry, or fish each day. A 3-oz serving of meat is about the same size as a deck of cards. One egg equals 1 oz. ? 2 servings of low-fat dairy each day. ? A serving of nuts, seeds, or beans 5 times each week. ? Heart-healthy fats. Healthy fats called Omega-3 fatty acids are found in foods such as flaxseeds and coldwater fish, like sardines, salmon, and mackerel.  Limit how much you eat of the following: ? Canned or prepackaged foods. ? Food that is high in trans fat, such as fried foods. ? Food that is high in saturated fat, such as fatty meat. ? Sweets, desserts, sugary drinks, and other foods with added sugar. ?  Full-fat dairy products.  Do not salt foods before eating.  Try to eat at least 2 vegetarian meals each week.  Eat more home-cooked food and less restaurant, buffet, and fast food.  When eating at a restaurant, ask that your food be prepared with less salt or no salt, if possible. What foods are recommended? The items listed may not be a complete list. Talk with your dietitian about what dietary choices are best for you. Grains Whole-grain or whole-wheat bread. Whole-grain or whole-wheat pasta. Brown rice. [place]. Bulgur. Whole-grain and low-sodium cereals. Pita bread. Low-fat, low-sodium crackers. Whole-wheat flour tortillas. Vegetables Fresh or frozen vegetables (raw, steamed, roasted, or grilled). Low-sodium or reduced-sodium tomato and vegetable juice. Low-sodium or  reduced-sodium tomato sauce and tomato paste. Low-sodium or reduced-sodium canned vegetables. Fruits All fresh, dried, or frozen fruit. Canned fruit in natural juice (without added sugar). Meat and other protein foods Skinless chicken or [place]. Ground chicken or [place]. Pork with fat trimmed off. Fish and seafood. Egg whites. Dried beans, peas, or lentils. Unsalted nuts, nut butters, and seeds. Unsalted canned beans. Lean cuts of beef with fat trimmed off. Low-sodium, lean deli meat. Dairy Low-fat (1%) or fat-free (skim) milk. Fat-free, low-fat, or reduced-fat cheeses. Nonfat, low-sodium ricotta or cottage cheese. Low-fat or nonfat yogurt. Low-fat, low-sodium cheese. Fats and oils Soft margarine without trans fats. Vegetable oil. Low-fat, reduced-fat, or light mayonnaise and salad dressings (reduced-sodium). Canola, safflower, olive, soybean, and sunflower oils. Avocado. Seasoning and other foods Herbs. Spices. Seasoning mixes without salt. Unsalted popcorn and pretzels. Fat-free sweets. What foods are not recommended? The items listed may not be a complete list. Talk with your dietitian about what dietary choices are best for you. Grains Baked goods made with fat, such as croissants, muffins, or some breads. Dry pasta or rice meal packs. Vegetables Creamed or fried vegetables. Vegetables in a cheese sauce. Regular canned vegetables (not low-sodium or reduced-sodium). Regular canned tomato sauce and paste (not low-sodium or reduced-sodium). Regular tomato and vegetable juice (not low-sodium or reduced-sodium). Angie Fava. Olives. Fruits Canned fruit in a light or heavy syrup. Fried fruit. Fruit in cream or butter sauce. Meat and other protein foods Fatty cuts of meat. Ribs. Fried meat. Berniece Salines. Sausage. Bologna and other processed lunch meats. Salami. Fatback. Hotdogs. Bratwurst. Salted nuts and seeds. Canned beans with added salt. Canned or smoked fish. Whole eggs or egg yolks. Chicken or [place]  with skin. Dairy Whole or 2% milk, cream, and half-and-half. Whole or full-fat cream cheese. Whole-fat or sweetened yogurt. Full-fat cheese. Nondairy creamers. Whipped toppings. Processed cheese and cheese spreads. Fats and oils Butter. Stick margarine. Lard. Shortening. Ghee. Bacon fat. Tropical oils, such as coconut, palm kernel, or palm oil. Seasoning and other foods Salted popcorn and pretzels. Onion salt, garlic salt, seasoned salt, table salt, and sea salt. Worcestershire sauce. Tartar sauce. Barbecue sauce. Teriyaki sauce. Soy sauce, including reduced-sodium. Steak sauce. Canned and packaged gravies. Fish sauce. Oyster sauce. Cocktail sauce. Horseradish that you find on the shelf. Ketchup. Mustard. Meat flavorings and tenderizers. Bouillon cubes. Hot sauce and Tabasco sauce. Premade or packaged marinades. Premade or packaged taco seasonings. Relishes. Regular salad dressings. Where to find more information:  National Heart, Lung, and [place]: https://wilson-eaton.com/  American Heart Association: www.heart.org Summary  The DASH eating plan is a healthy eating plan that has been shown to reduce high blood pressure (hypertension). It may also reduce your risk for type 2 diabetes, heart disease, and stroke.  With the DASH eating plan, you  should limit salt (sodium) intake to 2,300 mg a day. If you have hypertension, you may need to reduce your sodium intake to 1,500 mg a day.  When on the DASH eating plan, aim to eat more fresh fruits and vegetables, whole grains, lean proteins, low-fat dairy, and heart-healthy fats.  Work with your health care provider or diet and nutrition specialist (dietitian) to adjust your eating plan to your individual calorie needs. This information is not intended to replace advice given to you by your health care provider. Make sure you discuss any questions you have with your health care provider. Document Released: 08/29/2011 Document Revised: 09/02/2016  Document Reviewed: 09/02/2016 Elsevier Interactive Patient Education  2018 [employer]  Exercising to [employer] Minor[employer]ose Weight Exercising can help you to lose weight. In order to lose weight through exercise, you need to do vigorous-intensity exercise. You can tell that you are exercising with vigorous intensity if you are breathing very hard and fast and cannot hold a conversation while exercising. Moderate-intensity exercise helps to maintain your current weight. You can tell that you are exercising at a moderate level if you have a higher heart rate and faster breathing, but you are still able to hold a conversation. How often should I exercise? Choose an activity that you enjoy and set realistic goals. Your health care provider can help you to make an activity plan that works for you. Exercise regularly as directed by your health care provider. This may include:  Doing resistance training twice each week, such as: ? Push-ups. ? Sit-ups. ? Lifting weights. ? Using resistance bands.  Doing a given intensity of exercise for a given amount of time. Choose from these options: ? 150 minutes of moderate-intensity exercise every week. ? 75 minutes of vigorous-intensity exercise every week. ? A mix of moderate-intensity and vigorous-intensity exercise every week.  Children, pregnant women, people who are out of shape, people who are overweight, and older adults may need to consult a health care provider for individual recommendations. If you have any sort of medical condition, be sure to consult your health care provider before starting a new exercise program. What are some activities that can help me to lose weight?  Walking at a rate of at least 4.5 miles an hour.  Jogging or running at a rate of 5 miles per hour.  Biking at a rate of at least 10 miles per hour.  Lap swimming.  Roller-skating or in-line skating.  Cross-country skiing.  Vigorous competitive sports, such as football, basketball,  and soccer.  Jumping rope.  Aerobic dancing. How can I be more active in my day-to-day activities?  Use the stairs instead of the elevator.  Take a walk during your lunch break.  If you drive, park your car farther away from work or school.  If you take public transportation, get off one stop early and walk the rest of the way.  Make all of your phone calls while standing up and walking around.  Get up, stretch, and walk around every 30 minutes throughout the day. What guidelines should I follow while exercising?  Do not exercise so much that you hurt yourself, feel dizzy, or get very short of breath.  Consult your health care provider prior to starting a new exercise program.  Wear comfortable clothes and shoes with good support.  Drink plenty of water while you exercise to prevent dehydration or heat stroke. Body water is lost during exercise and must be replaced.  Work out until you breathe  faster and your heart beats faster. This information is not intended to replace advice given to you by your health care provider. Make sure you discuss any questions you have with your health care provider. Document Released: 10/12/2010 Document Revised: 02/15/2016 Document Reviewed: 02/10/2014 Elsevier Interactive Patient Education  [employer]2018 Elsevier Inc.

## 2018-05-13 ENCOUNTER — Encounter: Payer: Self-pay | Admitting: Family Medicine

## 2018-05-29 ENCOUNTER — Other Ambulatory Visit: Payer: Self-pay | Admitting: Family Medicine

## 2018-05-29 NOTE — Telephone Encounter (Signed)
Copied from CRM [id number]. Topic: Quick Communication - Rx Refill/Question >> May 29, 2018  4:55 PM Raquel Sarna wrote: pantoprazole (PROTONIX) 40 MG tablet  Expired - needing refills  [id number] [place], [place] - [address] [place] [address] Phone: [phone] Fax: [phone]

## 2018-05-29 NOTE — Telephone Encounter (Signed)
Routing to office  

## 2018-06-01 ENCOUNTER — Other Ambulatory Visit: Payer: Self-pay

## 2018-06-01 MED ORDER — PANTOPRAZOLE SODIUM 40 MG PO TBEC: 40.0000 mg | DELAYED_RELEASE_TABLET | Freq: Every day | ORAL | 1 refills | Status: DC | PRN

## 2018-06-01 NOTE — Telephone Encounter (Signed)
Pt takes Protonix 40 mg, pt sees cardiology Dr Clifton James who prescribed this Rx, pt is requesting for refills please Advise

## 2018-06-02 NOTE — Telephone Encounter (Signed)
OK to refill.   Jeanne [name]  

## 2018-06-03 NOTE — Telephone Encounter (Signed)
Pt's medication was sent to pt's pharmacy on 06/01/18 with refills. Confirmation received.

## 2018-07-14 DIAGNOSIS — Z6830 Body mass index (BMI) 30.0-30.9, adult: Secondary | ICD-10-CM | POA: Diagnosis not present

## 2018-07-14 DIAGNOSIS — Z1231 Encounter for screening mammogram for malignant neoplasm of breast: Secondary | ICD-10-CM | POA: Diagnosis not present

## 2018-07-14 DIAGNOSIS — Z01419 Encounter for gynecological examination (general) (routine) without abnormal findings: Secondary | ICD-10-CM | POA: Diagnosis not present

## 2018-11-09 DIAGNOSIS — E119 Type 2 diabetes mellitus without complications: Secondary | ICD-10-CM | POA: Diagnosis not present

## 2018-11-09 LAB — HM DIABETES EYE EXAM

## 2018-12-04 ENCOUNTER — Encounter: Payer: Self-pay | Admitting: Cardiology

## 2018-12-08 ENCOUNTER — Other Ambulatory Visit: Payer: Self-pay | Admitting: Cardiovascular Disease

## 2018-12-10 ENCOUNTER — Other Ambulatory Visit: Payer: Self-pay

## 2018-12-10 ENCOUNTER — Telehealth: Payer: Self-pay

## 2018-12-10 MED ORDER — VALSARTAN-HYDROCHLOROTHIAZIDE 160-25 MG PO TABS: 1.0000 | ORAL_TABLET | Freq: Every day | ORAL | 0 refills | Status: DC

## 2018-12-10 MED ORDER — CARVEDILOL 3.125 MG PO TABS: 3.1250 mg | ORAL_TABLET | Freq: Two times a day (BID) | ORAL | 0 refills | Status: DC

## 2018-12-10 NOTE — Addendum Note (Signed)
Addended by: Jacqlyn Krauss on: 12/10/2018 10:16 AM   Modules accepted: Orders

## 2018-12-10 NOTE — Telephone Encounter (Signed)
Pt called and history reviewed. Pt agreeable to postpone upcoming appt d/t COVID19. Pt reports no symptoms at this time. Pt advised to call back if any new symptoms present. Pt verbalized understanding. Refills sent in for pt per Robbie Lis. Will route to COVID cancel pool.

## 2018-12-10 NOTE — Telephone Encounter (Signed)
-----   Message from Allayne Butcher, [place] sent at 12/08/2018  3:54 PM EDT ----- Regarding: reschedule Chart reviewed. Normal cath in 2013. Being treated for coronary vasospasm. If no active cardiac symptoms, ok to reschedule appt in 2-3 months. Can refill cardiac meds if needed.

## 2018-12-16 ENCOUNTER — Ambulatory Visit: Payer: BLUE CROSS/BLUE SHIELD | Admitting: Cardiology

## 2018-12-31 ENCOUNTER — Telehealth: Payer: Self-pay | Admitting: Cardiology

## 2018-12-31 NOTE — Telephone Encounter (Signed)
Called patient to reschedule from COVID pool. She states that she is doing very well, no symptoms, BP stable. She states that she would like to reserve her spot for someone who may need it more than her at this time. Will plan to reschedule in several months. Reiterated to call us if needed before. She agrees.   Georgie Chard NP-C [facility] Pager: [phone]

## 2019-01-04 NOTE — Telephone Encounter (Signed)
Patient refused evisit. Patient scheduled to see Dr. Clifton James on 8/27. Instructed patient to call back if she developed any Sx.

## 2019-01-30 ENCOUNTER — Other Ambulatory Visit: Payer: Self-pay | Admitting: Cardiovascular Disease

## 2019-05-19 NOTE — Progress Notes (Signed)
Chief Complaint  Patient presents with  . Follow-up    HTN   History of Present Illness: 63 yo female with history of HTN, HLD, DM and possible coronary vasospasm who is here today for cardiac follow up. She presented with chest pain and elevated troponin levels in December 2013. Cardiac cath 09/22/12 showed angiographically normal coronary arteries. It was felt that this could represent coronary vasospasm vs myocarditis. Her LV function was normal.    She is here today for follow up. The patient denies any chest pain, dyspnea, palpitations, lower extremity edema, orthopnea, PND, dizziness, near syncope or syncope.   Primary Care Physician: Deeann SaintBanks, Shannon R, MD  Past Medical History:  Diagnosis Date  . Anemia   . Arthritis   . Diabetes mellitus   . Hypercholesteremia 2011?  Marland Kitchen. Hypertension   . NSTEMI (non-ST elevated myocardial infarction) [facility](HCC) December 2013   negative cath, normal LV function - felt to be vasospasm  . Thyroid disease     Past Surgical History:  Procedure Laterality Date  . CARDIAC CATHETERIZATION  December 2013   normal coronaries and normal LV function  . FOOT SURGERY    . LEFT HEART CATHETERIZATION WITH CORONARY ANGIOGRAM N/A 09/22/2012   Procedure: LEFT HEART CATHETERIZATION WITH CORONARY ANGIOGRAM;  Surgeon: Tonny BollmanMichael Cooper, MD;  [facility];  Service: Cardiovascular;  Laterality: N/A;  . THYROID SURGERY     Partial removed per pt    Current Outpatient Medications  Medication Sig Dispense Refill  . acetaminophen (TYLENOL) 325 MG tablet Take 325-650 mg by mouth as needed.    Marland Kitchen. amLODipine (NORVASC) 5 MG tablet Take 1 tablet (5 mg total) by mouth daily. 90 tablet 3  . aspirin 81 MG tablet Take 81 mg by mouth daily.    Marland Kitchen. CALCIUM PO Take 1 tablet by mouth daily. Chewable calcium    . carvedilol (COREG) 3.125 MG tablet Take 1 tablet (3.125 mg total) by mouth 2 (two) times daily. 180 tablet 3  . JANUVIA 100 MG tablet Take 100 mg by mouth daily. 100  mg daily by mouth  0  . levothyroxine (SYNTHROID, LEVOTHROID) 75 MCG tablet Take 75 mcg by mouth every morning. Take on an empty stomach    . metFORMIN (GLUCOPHAGE) 1000 MG tablet Take 1,000 mg by mouth 2 (two) times daily with a meal.    . nitroGLYCERIN (NITROSTAT) 0.4 MG SL tablet Place 1 tablet (0.4 mg total) under the tongue every 5 (five) minutes x 3 doses as needed for chest pain. 25 tablet 6  . ONE TOUCH ULTRA TEST test strip     . pantoprazole (PROTONIX) 40 MG tablet Take 1 tablet (40 mg total) by mouth daily as needed (heart burn/indigestion). 90 tablet 1  . pravastatin (PRAVACHOL) 10 MG tablet Take 10 mg by mouth daily.    . valsartan-hydrochlorothiazide (DIOVAN-HCT) 160-25 MG tablet Take 1 tablet by mouth daily. 90 tablet 0   No current facility-administered medications for this visit.     Allergies  Allergen Reactions  . Eggs Or Egg-Derived Products Other (See Comments)    Unknown reaction-patient states she was allergic to egg yolks when she was little. She can eat eggs now, but does not take flu shot or other egg-containing medications.  . Penicillins Other (See Comments)    Unknown-reaction as a child   . Pollen Extract     Social History   Socioeconomic History  . Marital status: Single    Spouse name: Not on  file  . Number of children: Not on file  . Years of education: Not on file  . Highest education level: Not on file  Occupational History  . Not on file  Social Needs  . Financial resource strain: Not on file  . Food insecurity    Worry: Not on file    Inability: Not on file  . Transportation needs    Medical: Not on file    Non-medical: Not on file  Tobacco Use  . Smoking status: Former Smoker    Quit date: 11/21/1990    Years since quitting: 28.5  . Smokeless tobacco: Never Used  Substance and Sexual Activity  . Alcohol use: No  . Drug use: No  . Sexual activity: Yes  Lifestyle  . Physical activity    Days per week: Not on file    Minutes per  session: Not on file  . Stress: Not on file  Relationships  . Social Musician on phone: Not on file    Gets together: Not on file    Attends religious service: Not on file    Active member of club or organization: Not on file    Attends meetings of clubs or organizations: Not on file    Relationship status: Not on file  . Intimate partner violence    Fear of current or ex partner: Not on file    Emotionally abused: Not on file    Physically abused: Not on file    Forced sexual activity: Not on file  Other Topics Concern  . Not on file  Social History Narrative  . Not on file    History reviewed. No pertinent family history.  Review of Systems:  As stated in the HPI and otherwise negative.   BP 128/80   Pulse 86   Ht 5\' 5"  (1.651 m)   Wt 182 lb 12.8 oz (82.9 kg)   SpO2 99%   BMI 30.42 kg/m   Physical Examination:  General: Well developed, well nourished, NAD  HEENT: OP clear, mucus membranes moist  SKIN: warm, dry. No rashes. Neuro: No focal deficits  Musculoskeletal: Muscle strength 5/5 all ext  Psychiatric: Mood and affect normal  Neck: No JVD, no carotid bruits, no thyromegaly, no lymphadenopathy.  Lungs:Clear bilaterally, no wheezes, rhonci, crackles Cardiovascular: Regular rate and rhythm. No murmurs, gallops or rubs. Abdomen:Soft. Bowel sounds present. Non-tender.  Extremities: No lower extremity edema. Pulses are 2 + in the bilateral DP/PT.  EKG:  EKG is ordered today. The ekg ordered today demonstrates NSR, rate 86 bpm. RBBB  Recent Labs: No results found for requested labs within last 8760 hours.   Lipid Panel Followed in primary care   Wt Readings from Last 3 Encounters:  05/20/19 182 lb 12.8 oz (82.9 kg)  05/12/18 177 lb (80.3 kg)  01/05/18 180 lb 12.8 oz (82 kg)     Other studies Reviewed: Additional studies/ records that were reviewed today include: . Review of the above records demonstrates:   Assessment and Plan:   1.  Coronary vasospasm: She is on ASA, Norvasc and Coreg. No chest pains. Will continue current therapy.   2. HTN: BP is well controlled.   3. RBBB: Chronic  Current medicines are reviewed at length with the patient today.  The patient does not have concerns regarding medicines.  The following changes have been made:  no change  Labs/ tests ordered today include:   No orders of the defined types were placed  in this encounter.   Disposition:   FU with me in 12  months  Signed, Lauree Chandler, MD 05/20/2019 10:26 AM    [facility] [address], [place], [place]  [address] Phone: [phone]; Fax: [phone]

## 2019-05-20 ENCOUNTER — Encounter: Payer: Self-pay | Admitting: Cardiovascular Disease

## 2019-05-20 ENCOUNTER — Ambulatory Visit: Payer: BC Managed Care – PPO | Admitting: Cardiovascular Disease

## 2019-05-20 ENCOUNTER — Other Ambulatory Visit: Payer: Self-pay

## 2019-05-20 VITALS — BP 128/80 | HR 86 | Ht 65.0 in | Wt 182.8 lb

## 2019-05-20 DIAGNOSIS — I201 Angina pectoris with documented spasm: Secondary | ICD-10-CM | POA: Diagnosis not present

## 2019-05-20 DIAGNOSIS — I4510 Unspecified right bundle-branch block: Secondary | ICD-10-CM | POA: Diagnosis not present

## 2019-05-20 DIAGNOSIS — I10 Essential (primary) hypertension: Secondary | ICD-10-CM | POA: Diagnosis not present

## 2019-05-20 MED ORDER — AMLODIPINE BESYLATE 5 MG PO TABS: 5.0000 mg | ORAL_TABLET | Freq: Every day | ORAL | 3 refills | Status: DC

## 2019-05-20 MED ORDER — CARVEDILOL 3.125 MG PO TABS: 3.1250 mg | ORAL_TABLET | Freq: Two times a day (BID) | ORAL | 3 refills | Status: DC

## 2019-05-20 NOTE — Patient Instructions (Signed)
Medication Instructions:  Your physician recommends that you continue on your current medications as directed. Please refer to the Current Medication list given to you today.  If you need a refill on your cardiac medications before your next appointment, please call your pharmacy.   Lab work: None If you have labs (blood work) drawn today and your tests are completely normal, you will receive your results only by: . MyChart Message (if you have MyChart) OR . A paper copy in the mail If you have any lab test that is abnormal or we need to change your treatment, we will call you to review the results.  Testing/Procedures: None  Follow-Up: At [facility], you and your health needs are our priority.  As part of our continuing mission to provide you with exceptional heart care, we have created designated Provider Care Teams.  These Care Teams include your primary Cardiologist (physician) and Advanced Practice Providers (APPs -  Physician Assistants and Nurse Practitioners) who all work together to provide you with the care you need, when you need it. You will need a follow up appointment in 12 months.  Please call our office 2 months in advance to schedule this appointment.  You may see Christopher McAlhany, MD or one of the following Advanced Practice Providers on your designated Care Team:   Brittainy Gielow, PA-C Dayna Dunn, PA-C . Michele Lenze, PA-C  Any Other Special Instructions Will Be Listed Below (If Applicable).    

## 2019-06-02 ENCOUNTER — Encounter: Payer: Self-pay | Admitting: Family Medicine

## 2019-06-02 ENCOUNTER — Other Ambulatory Visit: Payer: Self-pay

## 2019-06-02 ENCOUNTER — Ambulatory Visit (INDEPENDENT_AMBULATORY_CARE_PROVIDER_SITE_OTHER): Payer: BC Managed Care – PPO | Admitting: Family Medicine

## 2019-06-02 VITALS — BP 118/78 | HR 80 | Temp 98.7°F | Wt 175.0 lb

## 2019-06-02 DIAGNOSIS — E119 Type 2 diabetes mellitus without complications: Secondary | ICD-10-CM

## 2019-06-02 DIAGNOSIS — Z0000 Encounter for general adult medical examination without abnormal findings: Secondary | ICD-10-CM

## 2019-06-02 DIAGNOSIS — E782 Mixed hyperlipidemia: Secondary | ICD-10-CM

## 2019-06-02 DIAGNOSIS — E890 Postprocedural hypothyroidism: Secondary | ICD-10-CM | POA: Diagnosis not present

## 2019-06-02 DIAGNOSIS — I10 Essential (primary) hypertension: Secondary | ICD-10-CM | POA: Diagnosis not present

## 2019-06-02 DIAGNOSIS — E1169 Type 2 diabetes mellitus with other specified complication: Secondary | ICD-10-CM | POA: Insufficient documentation

## 2019-06-02 LAB — BASIC METABOLIC PANEL
BUN: 12 mg/dL (ref 6–23)
CO2: 27 mEq/L (ref 19–32)
Calcium: 10.1 mg/dL (ref 8.4–10.5)
Chloride: 100 mEq/L (ref 96–112)
Creatinine, Ser: 0.74 mg/dL (ref 0.40–1.20)
GFR: 95.65 mL/min (ref >60.00)
Glucose, Bld: 106 mg/dL — ABNORMAL HIGH (ref 70–99)
Potassium: 3.7 mEq/L (ref 3.5–5.1)
Sodium: 139 mEq/L (ref 135–145)

## 2019-06-02 LAB — HEMOGLOBIN A1C: Hgb A1c MFr Bld: 6.8 % — ABNORMAL HIGH (ref 4.6–6.5)

## 2019-06-02 LAB — LIPID PANEL
Cholesterol: 146 mg/dL (ref 0–200)
HDL: 45.4 mg/dL (ref >39.00)
LDL Cholesterol: 89 mg/dL (ref 0–99)
NonHDL: 100.73
Total CHOL/HDL Ratio: 3
Triglycerides: 60 mg/dL (ref 0.0–149.0)
VLDL: 12 mg/dL (ref 0.0–40.0)

## 2019-06-02 LAB — CBC WITH DIFFERENTIAL/PLATELET
Basophils Absolute: 0 10*3/uL (ref 0.0–0.1)
Basophils Relative: 0.3 % (ref 0.0–3.0)
Eosinophils Absolute: 0.1 10*3/uL (ref 0.0–0.7)
Eosinophils Relative: 1.1 % (ref 0.0–5.0)
HCT: 37.9 % (ref 36.0–46.0)
Hemoglobin: 12.6 g/dL (ref 12.0–15.0)
Lymphocytes Relative: 34.8 % (ref 12.0–46.0)
Lymphs Abs: 1.7 10*3/uL (ref 0.7–4.0)
MCHC: 33.3 g/dL (ref 30.0–36.0)
MCV: 88.3 fl (ref 78.0–100.0)
Monocytes Absolute: 0.4 10*3/uL (ref 0.1–1.0)
Monocytes Relative: 8.3 % (ref 3.0–12.0)
Neutro Abs: 2.6 10*3/uL (ref 1.4–7.7)
Neutrophils Relative %: 55.5 % (ref 43.0–77.0)
Platelets: 307 10*3/uL (ref 150.0–400.0)
RBC: 4.29 Mil/uL (ref 3.87–5.11)
RDW: 14.9 % (ref 11.5–15.5)
WBC: 4.8 10*3/uL (ref 4.0–10.5)

## 2019-06-02 LAB — TSH: TSH: 0.7 u[IU]/mL (ref 0.35–4.50)

## 2019-06-02 NOTE — Progress Notes (Signed)
Subjective:     Jeanne [name] is a 63 y.o. female and is here for a comprehensive physical exam. The patient reports no problems.  Doing well overall.  Pt states fsbs typically 120s-130s.  Pt notes gaining weight since COVID.  Walking for exercise.  [facility] but was doing cardio step classes.  BP has been stable.  Saw Cardiology a few wks ago.  Mammogram scheduled.  Needs refills on most meds.  Social History   Socioeconomic History  . Marital status: Single    Spouse name: Not on file  . Number of children: Not on file  . Years of education: Not on file  . Highest education level: Not on file  Occupational History  . Not on file  Social Needs  . Financial resource strain: Not on file  . Food insecurity    Worry: Not on file    Inability: Not on file  . Transportation needs    Medical: Not on file    Non-medical: Not on file  Tobacco Use  . Smoking status: Former Smoker    Quit date: 11/21/1990    Years since quitting: 28.5  . Smokeless tobacco: Never Used  Substance and Sexual Activity  . Alcohol use: No  . Drug use: No  . Sexual activity: Yes  Lifestyle  . Physical activity    Days per week: Not on file    Minutes per session: Not on file  . Stress: Not on file  Relationships  . Social Herbalist on phone: Not on file    Gets together: Not on file    Attends religious service: Not on file    Active member of club or organization: Not on file    Attends meetings of clubs or organizations: Not on file    Relationship status: Not on file  . Intimate partner violence    Fear of current or ex partner: Not on file    Emotionally abused: Not on file    Physically abused: Not on file    Forced sexual activity: Not on file  Other Topics Concern  . Not on file  Social History Narrative  . Not on file   Health Maintenance  Topic Date Due  . Hepatitis C Screening  04-07-1956  . HIV Screening  07/30/1970  . PAP  SMEAR-Modifier  05/24/2019  . INFLUENZA VACCINE  01/21/2020 (Originally 04/24/2019)  . MAMMOGRAM  07/24/2019  . TETANUS/TDAP  12/06/2023  . COLONOSCOPY  04/16/2027    The following portions of the patient's history were reviewed and updated as appropriate: allergies, current medications, past family history, past medical history, past social history, past surgical history and problem list.  Review of Systems Pertinent items noted in HPI and remainder of comprehensive ROS otherwise negative.   Objective:    BP 118/78 (BP Location: Left Arm, Patient Position: Sitting, Cuff Size: Large)   Pulse 80   Temp 98.7 F (37.1 C) (Oral)   Wt 175 lb (79.4 kg)   SpO2 98%   BMI 29.12 kg/m  General appearance: alert, cooperative and no distress Head: Normocephalic, without obvious abnormality, atraumatic Eyes: conjunctivae/corneas clear. PERRL, EOM's intact. Fundi benign. Ears: normal TM's and external ear canals both ears Nose: Nares normal. Septum midline. Mucosa normal. No drainage or sinus tenderness. Throat: lips, mucosa, and tongue normal; teeth and gums normal Neck: no adenopathy, no carotid bruit, no JVD, supple, symmetrical, trachea midline and thyroid  not enlarged, symmetric, no tenderness/mass/nodules Lungs: clear to auscultation bilaterally Heart: regular rate and rhythm, S1, S2 normal, no murmur, click, rub or gallop Abdomen: soft, non-tender; bowel sounds normal; no masses,  no organomegaly Extremities: extremities normal, atraumatic, no cyanosis or edema Pulses: 2+ and symmetric Skin: Skin color, texture, turgor normal. No rashes or lesions Lymph nodes: Cervical, supraclavicular, and axillary nodes normal. Neurologic: Alert and oriented X 3, normal strength and tone. Normal symmetric reflexes. Normal coordination and gait    Diabetic Foot Exam - Simple   Simple Foot Form Diabetic Foot exam was performed with the following findings: Yes 06/02/2019  8:30 AM  Visual Inspection No  deformities, no ulcerations, no other skin breakdown bilaterally: Yes Sensation Testing Intact to touch and monofilament testing bilaterally: Yes Pulse Check Posterior Tibialis and Dorsalis pulse intact bilaterally: Yes Comments Slightly decreased sensation to fine touch in b/l feet.     Assessment:    Healthy female exam.      Plan:     Anticipatory guidance given including wearing seatbelts, smoke detectors in the home, increasing physical activity, increasing p.o. intake of water and vegetables. -will obtain labs -offered influenza vaccine.  Pt declined. -mammogram scheduled in the next few months -given handout -will refill meds -next CPE in 1 yr See After Visit Summary for Counseling Recommendations    Essential hypertension  -controlled - Plan: Basic metabolic panel  Controlled type 2 diabetes mellitus without complication, without long-term current use of insulin (HCC)  -continue lifestyle modifications -continue metformin 1000 mg BID and januvia 100 mg daily - Plan: Hemoglobin A1c  Mixed hyperlipidemia - Plan: Lipid panel  Postoperative hypothyroidism  -levothyroxine 75 mcg - Plan: TSH   F/u in 6 months for chronic issues  Abbe AmsterdamShannon Banks, MD

## 2019-06-02 NOTE — Patient Instructions (Signed)
Preventive Care 40-64 Years Old, Female Preventive care refers to visits with your health care provider and lifestyle choices that can promote health and wellness. This includes:  A yearly physical exam. This may also be called an annual well check.  Regular dental visits and eye exams.  Immunizations.  Screening for certain conditions.  Healthy lifestyle choices, such as eating a healthy diet, getting regular exercise, not using drugs or products that contain nicotine and tobacco, and limiting alcohol use. What can I expect for my preventive care visit? Physical exam Your health care provider will check your:  Height and weight. This may be used to calculate body mass index (BMI), which tells if you are at a healthy weight.  Heart rate and blood pressure.  Skin for abnormal spots. Counseling Your health care provider may ask you questions about your:  Alcohol, tobacco, and drug use.  Emotional well-being.  Home and relationship well-being.  Sexual activity.  Eating habits.  Work and work environment.  Method of birth control.  Menstrual cycle.  Pregnancy history. What immunizations do I need?  Influenza (flu) vaccine  This is recommended every year. Tetanus, diphtheria, and pertussis (Tdap) vaccine  You may need a Td booster every 10 years. Varicella (chickenpox) vaccine  You may need this if you have not been vaccinated. Zoster (shingles) vaccine  You may need this after age 60. Measles, mumps, and rubella (MMR) vaccine  You may need at least one dose of MMR if you were born in 1957 or later. You may also need a second dose. Pneumococcal conjugate (PCV13) vaccine  You may need this if you have certain conditions and were not previously vaccinated. Pneumococcal polysaccharide (PPSV23) vaccine  You may need one or two doses if you smoke cigarettes or if you have certain conditions. Meningococcal conjugate (MenACWY) vaccine  You may need this if you  have certain conditions. Hepatitis A vaccine  You may need this if you have certain conditions or if you travel or work in places where you may be exposed to hepatitis A. Hepatitis B vaccine  You may need this if you have certain conditions or if you travel or work in places where you may be exposed to hepatitis B. Haemophilus influenzae type b (Hib) vaccine  You may need this if you have certain conditions. Human papillomavirus (HPV) vaccine  If recommended by your health care provider, you may need three doses over 6 months. You may receive vaccines as individual doses or as more than one vaccine together in one shot (combination vaccines). Talk with your health care provider about the risks and benefits of combination vaccines. What tests do I need? Blood tests  Lipid and cholesterol levels. These may be checked every 5 years, or more frequently if you are over 50 years old.  Hepatitis C test.  Hepatitis B test. Screening  Lung cancer screening. You may have this screening every year starting at age 55 if you have a 30-pack-year history of smoking and currently smoke or have quit within the past 15 years.  Colorectal cancer screening. All adults should have this screening starting at age 50 and continuing until age 75. Your health care provider may recommend screening at age 45 if you are at increased risk. You will have tests every 1-10 years, depending on your results and the type of screening test.  Diabetes screening. This is done by checking your blood sugar (glucose) after you have not eaten for a while (fasting). You may have this   done every 1-3 years.  Mammogram. This may be done every 1-2 years. Talk with your health care provider about when you should start having regular mammograms. This may depend on whether you have a family history of breast cancer.  BRCA-related cancer screening. This may be done if you have a family history of breast, ovarian, tubal, or peritoneal  cancers.  Pelvic exam and Pap test. This may be done every 3 years starting at age 56. Starting at age 45, this may be done every 5 years if you have a Pap test in combination with an HPV test. Other tests  Sexually transmitted disease (STD) testing.  Bone density scan. This is done to screen for osteoporosis. You may have this scan if you are at high risk for osteoporosis. Follow these instructions at home: Eating and drinking  Eat a diet that includes fresh fruits and vegetables, whole grains, lean protein, and low-fat dairy.  Take vitamin and mineral supplements as recommended by your health care provider.  Do not drink alcohol if: ? Your health care provider tells you not to drink. ? You are pregnant, may be pregnant, or are planning to become pregnant.  If you drink alcohol: ? Limit how much you have to 0-1 drink a day. ? Be aware of how much alcohol is in your drink. In the [place] one drink equals one 12 oz bottle of beer (355 mL), one 5 oz glass of wine (148 mL), or one 1 oz glass of hard liquor (44 mL). Lifestyle  Take daily care of your teeth and gums.  Stay active. Exercise for at least 30 minutes on 5 or more days each week.  Do not use any products that contain nicotine or tobacco, such as cigarettes, e-cigarettes, and chewing tobacco. If you need help quitting, ask your health care provider.  If you are sexually active, practice safe sex. Use a condom or other form of birth control (contraception) in order to prevent pregnancy and STIs (sexually transmitted infections).  If told by your health care provider, take low-dose aspirin daily starting at age 52. What's next?  Visit your health care provider once a year for a well check visit.  Ask your health care provider how often you should have your eyes and teeth checked.  Stay up to date on all vaccines. This information is not intended to replace advice given to you by your health care provider. Make sure you  discuss any questions you have with your health care provider. Document Released: 10/06/2015 Document Revised: 05/21/2018 Document Reviewed: 05/21/2018 Elsevier Patient Education  2020 [employer] Your Hypertension Hypertension is commonly called high blood pressure. This is when the force of your blood pressing against the walls of your arteries is too strong. Arteries are blood vessels that carry blood from your heart throughout your body. Hypertension forces the heart to work harder to pump blood, and may cause the arteries to become narrow or stiff. Having untreated or uncontrolled hypertension can cause heart attack, stroke, kidney disease, and other problems. What are blood pressure readings? A blood pressure reading consists of a higher number over a lower number. Ideally, your blood pressure should be below 120/80. The first ("top") number is called the systolic pressure. It is a measure of the pressure in your arteries as your heart beats. The second ("bottom") number is called the diastolic pressure. It is a measure of the pressure in your arteries as the heart relaxes. What does my blood pressure reading  mean? Blood pressure is classified into four stages. Based on your blood pressure reading, your health care provider may use the following stages to determine what type of treatment you need, if any. Systolic pressure and diastolic pressure are measured in a unit called mm Hg. Normal  Systolic pressure: below 409.  Diastolic pressure: below 80. Elevated  Systolic pressure: 811-914.  Diastolic pressure: below 80. Hypertension stage 1  Systolic pressure: 782-956.  Diastolic pressure: 21-30. Hypertension stage 2  Systolic pressure: 865 or above.  Diastolic pressure: 90 or above. What health risks are associated with hypertension? Managing your hypertension is an important responsibility. Uncontrolled hypertension can lead to:  A heart attack.  A stroke.  A  weakened blood vessel (aneurysm).  Heart failure.  Kidney damage.  Eye damage.  Metabolic syndrome.  Memory and concentration problems. What changes can I make to manage my hypertension? Hypertension can be managed by making lifestyle changes and possibly by taking medicines. Your health care provider will help you make a plan to bring your blood pressure within a normal range. Eating and drinking   Eat a diet that is high in fiber and potassium, and low in salt (sodium), added sugar, and fat. An example eating plan is called the DASH (Dietary Approaches to Stop Hypertension) diet. To eat this way: ? Eat plenty of fresh fruits and vegetables. Try to fill half of your plate at each meal with fruits and vegetables. ? Eat whole grains, such as whole wheat pasta, brown rice, or whole grain bread. Fill about one quarter of your plate with whole grains. ? Eat low-fat diary products. ? Avoid fatty cuts of meat, processed or cured meats, and poultry with skin. Fill about one quarter of your plate with lean proteins such as fish, chicken without skin, beans, eggs, and tofu. ? Avoid premade and processed foods. These tend to be higher in sodium, added sugar, and fat.  Reduce your daily sodium intake. Most people with hypertension should eat less than 1,500 mg of sodium a day.  Limit alcohol intake to no more than 1 drink a day for nonpregnant women and 2 drinks a day for men. One drink equals 12 oz of beer, 5 oz of wine, or 1 oz of hard liquor. Lifestyle  Work with your health care provider to maintain a healthy body weight, or to lose weight. Ask what an ideal weight is for you.  Get at least 30 minutes of exercise that causes your heart to beat faster (aerobic exercise) most days of the week. Activities may include walking, swimming, or biking.  Include exercise to strengthen your muscles (resistance exercise), such as weight lifting, as part of your weekly exercise routine. Try to do these  types of exercises for 30 minutes at least 3 days a week.  Do not use any products that contain nicotine or tobacco, such as cigarettes and e-cigarettes. If you need help quitting, ask your health care provider.  Control any long-term (chronic) conditions you have, such as high cholesterol or diabetes. Monitoring  Monitor your blood pressure at home as told by your health care provider. Your personal target blood pressure may vary depending on your medical conditions, your age, and other factors.  Have your blood pressure checked regularly, as often as told by your health care provider. Working with your health care provider  Review all the medicines you take with your health care provider because there may be side effects or interactions.  Talk with your health care  provider about your diet, exercise habits, and other lifestyle factors that may be contributing to hypertension.  Visit your health care provider regularly. Your health care provider can help you create and adjust your plan for managing hypertension. Will I need medicine to control my blood pressure? Your health care provider may prescribe medicine if lifestyle changes are not enough to get your blood pressure under control, and if:  Your systolic blood pressure is 130 or higher.  Your diastolic blood pressure is 80 or higher. Take medicines only as told by your health care provider. Follow the directions carefully. Blood pressure medicines must be taken as prescribed. The medicine does not work as well when you skip doses. Skipping doses also puts you at risk for problems. Contact a health care provider if:  You think you are having a reaction to medicines you have taken.  You have repeated (recurrent) headaches.  You feel dizzy.  You have swelling in your ankles.  You have trouble with your vision. Get help right away if:  You develop a severe headache or confusion.  You have unusual weakness or numbness, or you  feel faint.  You have severe pain in your chest or abdomen.  You vomit repeatedly.  You have trouble breathing. Summary  Hypertension is when the force of blood pumping through your arteries is too strong. If this condition is not controlled, it may put you at risk for serious complications.  Your personal target blood pressure may vary depending on your medical conditions, your age, and other factors. For most people, a normal blood pressure is less than 120/80.  Hypertension is managed by lifestyle changes, medicines, or both. Lifestyle changes include weight loss, eating a healthy, low-sodium diet, exercising more, and limiting alcohol. This information is not intended to replace advice given to you by your health care provider. Make sure you discuss any questions you have with your health care provider. Document Released: 06/03/2012 Document Revised: 01/01/2019 Document Reviewed: 08/07/2016 Elsevier Patient Education  2020 [employer].  Preventing Vitamin D Deficiency Vitamin D is a nutrient that helps your body absorb calcium from food. It plays a key role in the health of bones and teeth, muscle function, and infection prevention. Our bodies make vitamin D when our skin is exposed to direct sunlight. However, for many people, this may not be enough vitamin D to meet the body's needs. When you get too little vitamin D, it is called a deficiency. How can this condition affect me? A vitamin D deficiency can put you at risk of developing conditions that cause bones to be brittle, such as rickets or osteoporosis. If you are over age 38, not having enough vitamin D may weaken your muscles and bones and increase your risk for falls and broken bones. What can increase my risk? You may be at risk for a vitamin D deficiency if you:  Are pregnant.  Are obese.  Are over 12 years old.  Have dark skin.  Take certain medicines that affect the way vitamin D is absorbed.  Have had gastric  bypass surgery. Other risk factors include:  Having a condition that limits your ability to absorb fat, such as cystic fibrosis, celiac disease, or inflammatory bowel disease.  Having certain inherited conditions.  Not having access to foods rich in vitamin D.  Having limited ability to move.  Living in areas that have fewer hours of sunlight.  Spending most of your day indoors, or you cover your skin all the time  when you are outdoors. Breastfed infants are also at risk for vitamin D deficiency. What actions can I take to reduce my risk of a vitamin D deficiency? Knowing the best sources of vitamin D You can meet your daily vitamin D needs from:  Foods.  Dietary supplements.  Direct exposure to natural sunlight.  Infant formula (for babies). Knowing how much vitamin D you need General recommendations for daily vitamin D intake vary by these categories:  Infants: 400 International Units.  Children over 50 year old: 600 International Units.  Adults: 600 International Units.  Pregnant and breastfeeding women: 600 International Units.  Adults over 69 years old: 106 International Units. These are minimum levels of recommended amounts. Your health care provider may recommend a different amount of vitamin D intake based on your specific needs and your overall health. Getting sun exposure  Get regular, safe exposure to natural sunlight. Expose your skin to direct sunlight for at least 15 minutes every day. If you have dark skin, you may need to expose your skin for a longer period of time.  Protect your skin from too much sun exposure. This helps to prevent skin cancer.  Ask your health care provider if regular sun exposure is safe for you.  Do not use a tanning bed. Eating and drinking   Eat foods that naturally contain vitamin D. These include: ? Beef liver. ? Egg yolk. ? Fatty fish, such as cod, salmon, trout, swordfish, shrimp, sardines, and tuna. ? Cheese. ?  Mushrooms. ? Oysters.  Eat or drink products that have been fortified with vitamin D. Fortified means that vitamin D has been added to the food. These may include: ? Cereals. ? Dairy products, such as milk, yogurt, butter, or margarine. ? Orange juice. ? Alternative milks, such as soy milk or almond milk.  When choosing foods, check the food label on the package to see: ? How much vitamin D is in the item. ? If the food is fortified with vitamin D.  Although it is hard to get your vitamin D requirement from foods alone, you should eat a balanced diet each day that includes foods naturally higher in vitamin D or fortified with it. Try to include the following in your diet each day: ? 2-3 servings of meat or meat alternatives. ? 2-3 servings of dairy. Taking supplements If you are at risk for vitamin D deficiency, or if you have certain diseases, your health care provider may recommend that you take a vitamin D supplement. Make sure you:  Talk with your health care provider before you start taking any vitamin D supplements. You may be more sensitive to the side effects of vitamin D supplements if you are on certain medicines or have certain medical conditions.  Tell your health care provider about all medicines you are taking, including vitamin, mineral, and herbal supplements.  Take medicines and supplements only as told by your health care provider. Summary  Vitamin D is a nutrient that helps your body absorb calcium from food.  A vitamin D deficiency can put you at risk of developing conditions that cause bones to be brittle, such as rickets or osteoporosis.  Our bodies make vitamin D when our skin is exposed to direct sunlight. However, for many people, this may not be enough vitamin D to meet the body's needs.  Some foods naturally contain vitamin D, including beef liver, egg yolk, and fatty fish.  Products may also be fortified with vitamin D. Fortified means that vitamin  D has  been added to the food. This information is not intended to replace advice given to you by your health care provider. Make sure you discuss any questions you have with your health care provider. Document Released: 05/31/2015 Document Revised: 01/01/2019 Document Reviewed: 09/04/2018 Elsevier Patient Education  2020 [employer].  Diabetes Mellitus and Standards of Medical Care Managing diabetes (diabetes mellitus) can be complicated. Your diabetes treatment may be managed by a team of health care providers, including:  A physician who specializes in diabetes (endocrinologist).  A nurse practitioner or physician assistant.  Nurses.  A diet and nutrition specialist (registered dietitian).  A certified diabetes educator (CDE).  An exercise specialist.  A pharmacist.  An eye doctor.  A foot specialist (podiatrist).  A dentist.  A primary care provider.  A mental health provider. Your health care providers follow guidelines to help you get the best quality of care. The following schedule is a general guideline for your diabetes management plan. Your health care providers may give you more specific instructions. Physical exams Upon being diagnosed with diabetes mellitus, and each year after that, your health care provider will ask about your medical and family history. He or she will also do a physical exam. Your exam may include:  Measuring your height, weight, and body mass index (BMI).  Checking your blood pressure. This will be done at every routine medical visit. Your target blood pressure may vary depending on your medical conditions, your age, and other factors.  Thyroid gland exam.  Skin exam.  Screening for damage to your nerves (peripheral neuropathy). This may include checking the pulse in your legs and feet and checking the level of sensation in your hands and feet.  A complete foot exam to inspect the structure and skin of your feet, including checking for cuts,  bruises, redness, blisters, sores, or other problems.  Screening for blood vessel (vascular) problems, which may include checking the pulse in your legs and feet and checking your temperature. Blood tests Depending on your treatment plan and your personal needs, you may have the following tests done:  HbA1c (hemoglobin A1c). This test provides information about blood sugar (glucose) control over the previous 2-3 months. It is used to adjust your treatment plan, if needed. This test will be done: ? At least 2 times a year, if you are meeting your treatment goals. ? 4 times a year, if you are not meeting your treatment goals or if treatment goals have changed.  Lipid testing, including total, LDL, and HDL cholesterol and triglyceride levels. ? The goal for LDL is less than 100 mg/dL (5.5 mmol/L). If you are at high risk for complications, the goal is less than 70 mg/dL (3.9 mmol/L). ? The goal for HDL is 40 mg/dL (2.2 mmol/L) or higher for men and 50 mg/dL (2.8 mmol/L) or higher for women. An HDL cholesterol of 60 mg/dL (3.3 mmol/L) or higher gives some protection against heart disease. ? The goal for triglycerides is less than 150 mg/dL (8.3 mmol/L).  Liver function tests.  Kidney function tests.  Thyroid function tests. Dental and eye exams  Visit your dentist two times a year.  If you have type 1 diabetes, your health care provider may recommend an eye exam 3-5 years after you are diagnosed, and then once a year after your first exam. ? For children with type 1 diabetes, a health care provider may recommend an eye exam when your child is age 20 or older and has  had diabetes for 3-5 years. After the first exam, your child should get an eye exam once a year.  If you have type 2 diabetes, your health care provider may recommend an eye exam as soon as you are diagnosed, and then once a year after your first exam. Immunizations   The yearly flu (influenza) vaccine is recommended for  everyone 6 months or older who has diabetes.  The pneumonia (pneumococcal) vaccine is recommended for everyone 2 years or older who has diabetes. If you are 25 or older, you may get the pneumonia vaccine as a series of two separate shots.  The hepatitis B vaccine is recommended for adults shortly after being diagnosed with diabetes.  Adults and children with diabetes should receive all other vaccines according to age-specific recommendations from the Centers for Disease Control and Prevention (CDC). Mental and emotional health Screening for symptoms of eating disorders, anxiety, and depression is recommended at the time of diagnosis and afterward as needed. If your screening shows that you have symptoms (positive screening result), you may need more evaluation and you may work with a mental health care provider. Treatment plan Your treatment plan will be reviewed at every medical visit. You and your health care provider will discuss:  How you are taking your medicines, including insulin.  Any side effects you are experiencing.  Your blood glucose target goals.  The frequency of your blood glucose monitoring.  Lifestyle habits, such as activity level as well as tobacco, alcohol, and substance use. Diabetes self-management education Your health care provider will assess how well you are monitoring your blood glucose levels and whether you are taking your insulin correctly. He or she may refer you to:  A certified diabetes educator to manage your diabetes throughout your life, starting at diagnosis.  A registered dietitian who can create or review your personal nutrition plan.  An exercise specialist who can discuss your activity level and exercise plan. Summary  Managing diabetes (diabetes mellitus) can be complicated. Your diabetes treatment may be managed by a team of health care providers.  Your health care providers follow guidelines in order to help you get the best quality of  care.  Standards of care including having regular physical exams, blood tests, blood pressure monitoring, immunizations, screening tests, and education about how to manage your diabetes.  Your health care providers may also give you more specific instructions based on your individual health. This information is not intended to replace advice given to you by your health care provider. Make sure you discuss any questions you have with your health care provider. Document Released: 07/07/2009 Document Revised: 05/29/2018 Document Reviewed: 06/07/2016 Elsevier Patient Education  2020 [employer].

## 2019-06-04 ENCOUNTER — Encounter: Payer: Self-pay | Admitting: Family Medicine

## 2019-06-10 ENCOUNTER — Other Ambulatory Visit: Payer: Self-pay | Admitting: Family Medicine

## 2019-06-10 MED ORDER — METFORMIN HCL 1000 MG PO TABS: 1000.0000 mg | ORAL_TABLET | Freq: Two times a day (BID) | ORAL | 1 refills | Status: DC

## 2019-06-10 MED ORDER — PRAVASTATIN SODIUM 10 MG PO TABS: 10.0000 mg | ORAL_TABLET | Freq: Every day | ORAL | 0 refills | Status: DC

## 2019-06-10 MED ORDER — LEVOTHYROXINE SODIUM 75 MCG PO TABS: 75.0000 ug | ORAL_TABLET | Freq: Every morning | ORAL | 0 refills | Status: DC

## 2019-06-10 NOTE — Telephone Encounter (Signed)
Copied from [place] [id number]. Topic: Quick Communication - Rx Refill/Question >> Jun 10, 2019  9:48 AM Rainey Pines A wrote: Medication: pravastatin (PRAVACHOL) 10 MG tablet,metFORMIN (GLUCOPHAGE) 1000 MG tablet,levothyroxine (SYNTHROID, LEVOTHROID) 75 MCG tablet   Has the patient contacted their pharmacy? {Yes (Agent: If no, request that the patient contact the pharmacy for the refill.) (Agent: If yes, when and what did the pharmacy advise?)Contact PCP  Preferred Pharmacy (with phone number or street name): [id number] - [place], [place]. [phone] (Phone) [phone] (Fax)    Agent: Please be advised that RX refills may take up to 3 business days. We ask that you follow-up with your pharmacy.

## 2019-06-10 NOTE — Telephone Encounter (Signed)
Requested medication (s) are due for refill today: yes  Requested medication (s) are on the active medication list: yes  Future visit schedule: yes  Notes to clinic: review for refill   Requested Prescriptions  Pending Prescriptions Disp Refills   levothyroxine (SYNTHROID) 75 MCG tablet       Sig: Take 1 tablet (75 mcg total) by mouth every morning. Take on an empty stomach     Endocrinology:  Hypothyroid Agents Failed - 06/10/2019  9:59 AM      Failed - TSH needs to be rechecked within 3 months after an abnormal result. Refill until TSH is due.      Passed - TSH in normal range and within 360 days    TSH  Date Value Ref Range Status  06/02/2019 0.70 0.35 - 4.50 uIU/mL Final         Passed - Valid encounter within last 12 months    Recent Outpatient Visits          1 week ago Well adult exam   [facility], [facility] at [facility], Langley Adie, MD   1 year ago Controlled type 2 diabetes mellitus without complication, without long-term current use of insulin ([place])   [place] [facility] [place], MD   5 years ago Pain in finger of right hand   [facility] Care at [facility], Renette Butters, MD      Future Appointments            In 5 months Volanda Napoleon, Langley Adie, MD [place] at [place], [place]            metFORMIN (GLUCOPHAGE) 1000 MG tablet       Sig: Take 1 tablet (1,000 mg total) by mouth 2 (two) times daily with a meal.     Endocrinology:  Diabetes - Biguanides Passed - 06/10/2019  9:59 AM      Passed - Cr in normal range and within 360 days    Creatinine, Ser  Date Value Ref Range Status  06/02/2019 0.74 0.40 - 1.20 mg/dL Final         Passed - HBA1C is between 0 and 7.9 and within 180 days    Hgb A1c MFr Bld  Date Value Ref Range Status  06/02/2019 6.8 (H) 4.6 - 6.5 % Final    Comment:    Glycemic Control Guidelines for People with Diabetes:Non Diabetic:  <6%Goal of Therapy: <7%Additional Action Suggested:  >8%          Passed - eGFR in  normal range and within 360 days    GFR calc Af Amer  Date Value Ref Range Status  09/22/2012 >90 >90 mL/min Final    Comment:           The eGFR has been calculated using the CKD EPI equation. This calculation has not been validated in all clinical situations. eGFR's persistently <90 mL/min signify possible Chronic Kidney Disease.   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     Cardiovascular:  Antilipid - Statins Passed - 06/10/2019  9:59 AM      Passed - Total Cholesterol in normal range and within 360 days    Cholesterol  Date Value Ref Range Status  06/02/2019 146 0 - 200 mg/dL Final    Comment:    ATP III Classification       Desirable:  < 200 mg/dL               Borderline High:  200 - 239 mg/dL          High:  > = 240 mg/dL         Passed - LDL in normal range and within 360 days    LDL Cholesterol  Date Value Ref Range Status  06/02/2019 89 0 - 99 mg/dL Final         Passed - HDL in normal range and within 360 days    HDL  Date Value Ref Range Status  06/02/2019 45.40 >39.00 mg/dL Final         Passed - Triglycerides in normal range and within 360 days    Triglycerides  Date Value Ref Range  Status  06/02/2019 60.0 0.0 - 149.0 mg/dL Final    Comment:    Normal:  <150 mg/dLBorderline High:  150 - 199 mg/dL         Passed - Patient is not pregnant      Passed - Valid encounter within last 12 months    Recent Outpatient Visits          1 week ago Well adult exam   Therapist, 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

## 2019-06-11 ENCOUNTER — Other Ambulatory Visit: Payer: Self-pay | Admitting: Family Medicine

## 2019-06-12 ENCOUNTER — Encounter: Payer: Self-pay | Admitting: Family Medicine

## 2019-06-14 ENCOUNTER — Encounter: Payer: Self-pay | Admitting: Family Medicine

## 2019-06-14 ENCOUNTER — Other Ambulatory Visit: Payer: Self-pay

## 2019-06-14 MED ORDER — METFORMIN HCL 500 MG PO TABS: ORAL_TABLET | ORAL | 1 refills | Status: DC

## 2019-06-14 MED ORDER — METFORMIN HCL 1000 MG PO TABS: ORAL_TABLET | ORAL | 1 refills | Status: DC

## 2019-06-14 MED ORDER — SITAGLIPTIN PHOSPHATE 100 MG PO TABS: 100.0000 mg | ORAL_TABLET | Freq: Every day | ORAL | 0 refills | Status: DC

## 2019-06-22 ENCOUNTER — Other Ambulatory Visit: Payer: Self-pay

## 2019-06-22 MED ORDER — SITAGLIPTIN PHOSPHATE 100 MG PO TABS: 100.0000 mg | ORAL_TABLET | Freq: Every day | ORAL | 0 refills | Status: DC

## 2019-06-22 MED ORDER — METFORMIN HCL 500 MG PO TABS: ORAL_TABLET | ORAL | 1 refills | Status: DC

## 2019-07-01 ENCOUNTER — Other Ambulatory Visit: Payer: Self-pay | Admitting: Family Medicine

## 2019-08-05 DIAGNOSIS — Z124 Encounter for screening for malignant neoplasm of cervix: Secondary | ICD-10-CM | POA: Diagnosis not present

## 2019-08-05 DIAGNOSIS — Z1231 Encounter for screening mammogram for malignant neoplasm of breast: Secondary | ICD-10-CM | POA: Diagnosis not present

## 2019-08-05 DIAGNOSIS — Z01419 Encounter for gynecological examination (general) (routine) without abnormal findings: Secondary | ICD-10-CM | POA: Diagnosis not present

## 2019-08-05 DIAGNOSIS — Z13820 Encounter for screening for osteoporosis: Secondary | ICD-10-CM | POA: Diagnosis not present

## 2019-08-23 DIAGNOSIS — R928 Other abnormal and inconclusive findings on diagnostic imaging of breast: Secondary | ICD-10-CM | POA: Diagnosis not present

## 2019-09-05 ENCOUNTER — Other Ambulatory Visit: Payer: Self-pay | Admitting: Family Medicine

## 2019-09-05 ENCOUNTER — Other Ambulatory Visit: Payer: Self-pay | Admitting: Cardiovascular Disease

## 2019-09-07 ENCOUNTER — Other Ambulatory Visit: Payer: Self-pay | Admitting: Family Medicine

## 2019-09-09 ENCOUNTER — Other Ambulatory Visit: Payer: Self-pay | Admitting: Family Medicine

## 2019-11-30 ENCOUNTER — Other Ambulatory Visit: Payer: Self-pay

## 2019-12-01 ENCOUNTER — Ambulatory Visit (INDEPENDENT_AMBULATORY_CARE_PROVIDER_SITE_OTHER): Payer: BC Managed Care – PPO | Admitting: Family Medicine

## 2019-12-01 ENCOUNTER — Other Ambulatory Visit: Payer: Self-pay

## 2019-12-01 ENCOUNTER — Other Ambulatory Visit: Payer: Self-pay | Admitting: Family Medicine

## 2019-12-01 ENCOUNTER — Encounter: Payer: Self-pay | Admitting: Family Medicine

## 2019-12-01 VITALS — BP 126/72 | HR 96 | Temp 97.9°F | Wt 180.0 lb

## 2019-12-01 DIAGNOSIS — Z7189 Other specified counseling: Secondary | ICD-10-CM

## 2019-12-01 DIAGNOSIS — I10 Essential (primary) hypertension: Secondary | ICD-10-CM | POA: Diagnosis not present

## 2019-12-01 DIAGNOSIS — E119 Type 2 diabetes mellitus without complications: Secondary | ICD-10-CM | POA: Diagnosis not present

## 2019-12-01 DIAGNOSIS — Z1159 Encounter for screening for other viral diseases: Secondary | ICD-10-CM | POA: Diagnosis not present

## 2019-12-01 DIAGNOSIS — E890 Postprocedural hypothyroidism: Secondary | ICD-10-CM | POA: Diagnosis not present

## 2019-12-01 LAB — BASIC METABOLIC PANEL
BUN: 16 mg/dL (ref 6–23)
CO2: 30 mEq/L (ref 19–32)
Calcium: 10.3 mg/dL (ref 8.4–10.5)
Chloride: 101 mEq/L (ref 96–112)
Creatinine, Ser: 0.78 mg/dL (ref 0.40–1.20)
GFR: 89.87 mL/min (ref >60.00)
Glucose, Bld: 107 mg/dL — ABNORMAL HIGH (ref 70–99)
Potassium: 4 mEq/L (ref 3.5–5.1)
Sodium: 140 mEq/L (ref 135–145)

## 2019-12-01 LAB — MICROALBUMIN / CREATININE URINE RATIO
Creatinine,U: 113.3 mg/dL
Microalb Creat Ratio: 0.9 mg/g (ref 0.0–30.0)
Microalb, Ur: 1.1 mg/dL (ref 0.0–1.9)

## 2019-12-01 LAB — TSH: TSH: 0.97 u[IU]/mL (ref 0.35–4.50)

## 2019-12-01 LAB — HEMOGLOBIN A1C: Hgb A1c MFr Bld: 7.2 % — ABNORMAL HIGH (ref 4.6–6.5)

## 2019-12-01 MED ORDER — PRAVASTATIN SODIUM 10 MG PO TABS: 10.0000 mg | ORAL_TABLET | Freq: Every day | ORAL | 0 refills | Status: DC

## 2019-12-01 MED ORDER — LEVOTHYROXINE SODIUM 75 MCG PO TABS: 75.0000 ug | ORAL_TABLET | Freq: Every morning | ORAL | 0 refills | Status: DC

## 2019-12-01 MED ORDER — METFORMIN HCL 500 MG PO TABS: ORAL_TABLET | ORAL | 1 refills | Status: DC

## 2019-12-01 MED ORDER — SITAGLIPTIN PHOSPHATE 100 MG PO TABS: 100.0000 mg | ORAL_TABLET | Freq: Every day | ORAL | 0 refills | Status: DC

## 2019-12-01 NOTE — Progress Notes (Signed)
Subjective:    Patient ID: Jeanne [name], female    DOB: 1956-02-18, 64 y.o.   MRN: [id number]  No chief complaint on file.   HPI Patient was seen today for f/u on DM II and chronic conditions.  Pt states she has not been checking her bs in a while.  Taking metformin and januvia.  Needs refill on pravastatin.  Walking for exercise.  Working on diet.  Taking bp meds.  Denies HAs, CP, dizziness.  Taking synthroid 75 mcg daily.  Pt inquires about COVID vaccine(s).  Pt trying to decide if she wants to get vaccinated.  States one of her sons is against the idea.  Past Medical History:  Diagnosis Date  . Anemia   . Arthritis   . Diabetes mellitus   . Hypercholesteremia 2011?  [place] Hypertension   . NSTEMI (non-ST elevated myocardial infarction) [facility]) December 2013   negative cath, normal LV function - felt to be vasospasm  . Thyroid disease     Allergies  Allergen Reactions  . Eggs Or Egg-Derived Products Other (See Comments)    Unknown reaction-patient states she was allergic to egg yolks when she was little. She can eat eggs now, but does not take flu shot or other egg-containing medications.  . Penicillins Other (See Comments)    Unknown-reaction as a child   . Pollen Extract     ROS General: Denies fever, chills, night sweats, changes in weight, changes in appetite HEENT: Denies headaches, ear pain, changes in vision, rhinorrhea, sore throat CV: Denies CP, palpitations, SOB, orthopnea Pulm: Denies SOB, cough, wheezing GI: Denies abdominal pain, nausea, vomiting, diarrhea, constipation GU: Denies dysuria, hematuria, frequency, vaginal discharge Msk: Denies muscle cramps, joint pains Neuro: Denies weakness, numbness, tingling Skin: Denies rashes, bruising Psych: Denies depression, anxiety, hallucinations    Objective:    Blood pressure 126/72, pulse 96, temperature 97.9 F (36.6 C), temperature source Temporal, weight 180 lb (81.6 kg), SpO2 98 %.  Gen. Pleasant,  well-nourished, in no distress, normal affect  HEENT: [place]/AT, face symmetric, conjunctiva clear, no scleral icterus, PERRLA, nares patent without drainage, Lungs: no accessory muscle use, CTAB, no wheezes or rales Cardiovascular: RRR, no m/r/g, no peripheral edema Musculoskeletal: No deformities, no cyanosis or clubbing, normal tone Neuro:  A&Ox3, CN II-XII intact, normal gait Skin:  Warm, no lesions/ rash  Diabetic Foot Exam - Simple   Simple Foot Form Diabetic Foot exam was performed with the following findings: Yes 12/01/2019  8:55 AM  Visual Inspection No deformities, no ulcerations, no other skin breakdown bilaterally: Yes See comments: Yes Sensation Testing Intact to touch and monofilament testing bilaterally: Yes Pulse Check Posterior Tibialis and Dorsalis pulse intact bilaterally: Yes Comments Thickened skin on R plantar surface of MTP joint.  Vibratory sensation intact.     Wt Readings from Last 3 Encounters:  12/01/19 180 lb (81.6 kg)  06/02/19 175 lb (79.4 kg)  05/20/19 182 lb 12.8 oz (82.9 kg)    Lab Results  Component Value Date   WBC 4.8 06/02/2019   HGB 12.6 06/02/2019   HCT 37.9 06/02/2019   PLT 307.0 06/02/2019   GLUCOSE 106 (H) 06/02/2019   CHOL 146 06/02/2019   TRIG 60.0 06/02/2019   HDL 45.40 06/02/2019   LDLCALC 89 06/02/2019   ALT 16 08/02/2011   AST 16 08/02/2011   NA 139 06/02/2019   K 3.7 06/02/2019   CL 100 06/02/2019   CREATININE 0.74 06/02/2019   BUN 12 06/02/2019   CO2  27 06/02/2019   TSH 0.70 06/02/2019   INR 1.06 09/22/2012   HGBA1C 6.8 (H) 06/02/2019    Assessment/Plan:  Controlled type 2 diabetes mellitus without complication, without long-term current use of insulin ([facility])  -hgb A1C 6.8% -lifestyle modifications encouraged -continue current medications:  Metformin 1000 mg BID and Januvia 100 mg daily. -continue checking fsbs regularly - Plan: Hemoglobin A1c, Microalbumin/Creatinine Ratio, Urine  Essential hypertension   -controlled -continue norvasc 5 mg, coreg 3.125 mg BID, valsartan-HCTZ 160-25 mg -continue lifestyle modifications - Plan: Basic metabolic panel  Postoperative hypothyroidism  -continue synthroid 75 mcg.  Will adjust dose if needed based on labs - Plan: TSH  Need for hepatitis C screening test  - Plan: Hep C Antibody  Educated about COVID-19 virus infection -discussed s/s of COVID-19 -reviewed various COVID vaccine options.  Advised all ultimately help reduce the risk of hospitalization and death from COVID-19.   -given info to schedule vaccine   F/u prn  Grier Mitts, MD

## 2019-12-01 NOTE — Patient Instructions (Addendum)
Diabetes Mellitus and Nutrition, Adult When you have diabetes (diabetes mellitus), it is very important to have healthy eating habits because your blood sugar (glucose) levels are greatly affected by what you eat and drink. Eating healthy foods in the appropriate amounts, at about the same times every day, can help you:  Control your blood glucose.  Lower your risk of heart disease.  Improve your blood pressure.  Reach or maintain a healthy weight. Every person with diabetes is different, and each person has different needs for a meal plan. Your health care provider may recommend that you work with a diet and nutrition specialist (dietitian) to make a meal plan that is best for you. Your meal plan may vary depending on factors such as:  The calories you need.  The medicines you take.  Your weight.  Your blood glucose, blood pressure, and cholesterol levels.  Your activity level.  Other health conditions you have, such as heart or kidney disease. How do carbohydrates affect me? Carbohydrates, also called carbs, affect your blood glucose level more than any other type of food. Eating carbs naturally raises the amount of glucose in your blood. Carb counting is a method for keeping track of how many carbs you eat. Counting carbs is important to keep your blood glucose at a healthy level, especially if you use insulin or take certain oral diabetes medicines. It is important to know how many carbs you can safely have in each meal. This is different for every person. Your dietitian can help you calculate how many carbs you should have at each meal and for each snack. Foods that contain carbs include:  Bread, cereal, rice, pasta, and crackers.  Potatoes and corn.  Peas, beans, and lentils.  Milk and yogurt.  Fruit and juice.  Desserts, such as cakes, cookies, ice cream, and candy. How does alcohol affect me? Alcohol can cause a sudden decrease in blood glucose (hypoglycemia),  especially if you use insulin or take certain oral diabetes medicines. Hypoglycemia can be a life-threatening condition. Symptoms of hypoglycemia (sleepiness, dizziness, and confusion) are similar to symptoms of having too much alcohol. If your health care provider says that alcohol is safe for you, follow these guidelines:  Limit alcohol intake to no more than 1 drink per day for nonpregnant women and 2 drinks per day for men. One drink equals 12 oz of beer, 5 oz of wine, or 1 oz of hard liquor.  Do not drink on an empty stomach.  Keep yourself hydrated with water, diet soda, or unsweetened iced tea.  Keep in mind that regular soda, juice, and other mixers may contain a lot of sugar and must be counted as carbs. What are tips for following this plan?  Reading food labels  Start by checking the serving size on the "Nutrition Facts" label of packaged foods and drinks. The amount of calories, carbs, fats, and other nutrients listed on the label is based on one serving of the item. Many items contain more than one serving per package.  Check the total grams (g) of carbs in one serving. You can calculate the number of servings of carbs in one serving by dividing the total carbs by 15. For example, if a food has 30 g of total carbs, it would be equal to 2 servings of carbs.  Check the number of grams (g) of saturated and trans fats in one serving. Choose foods that have low or no amount of these fats.  Check the number of   milligrams (mg) of salt (sodium) in one serving. Most people should limit total sodium intake to less than 2,300 mg per day.  Always check the nutrition information of foods labeled as "low-fat" or "nonfat". These foods may be higher in added sugar or refined carbs and should be avoided.  Talk to your dietitian to identify your daily goals for nutrients listed on the label. Shopping  Avoid buying canned, premade, or processed foods. These foods tend to be high in fat, sodium,  and added sugar.  Shop around the outside edge of the grocery store. This includes fresh fruits and vegetables, bulk grains, fresh meats, and fresh dairy. Cooking  Use low-heat cooking methods, such as baking, instead of high-heat cooking methods like deep frying.  Cook using healthy oils, such as olive, canola, or sunflower oil.  Avoid cooking with butter, cream, or high-fat meats. Meal planning  Eat meals and snacks regularly, preferably at the same times every day. Avoid going long periods of time without eating.  Eat foods high in fiber, such as fresh fruits, vegetables, beans, and whole grains. Talk to your dietitian about how many servings of carbs you can eat at each meal.  Eat 4-6 ounces (oz) of lean protein each day, such as lean meat, chicken, fish, eggs, or tofu. One oz of lean protein is equal to: ? 1 oz of meat, chicken, or fish. ? 1 egg. ?  cup of tofu.  Eat some foods each day that contain healthy fats, such as avocado, nuts, seeds, and fish. Lifestyle  Check your blood glucose regularly.  Exercise regularly as told by your health care provider. This may include: ? 150 minutes of moderate-intensity or vigorous-intensity exercise each week. This could be brisk walking, biking, or water aerobics. ? Stretching and doing strength exercises, such as yoga or weightlifting, at least 2 times a week.  Take medicines as told by your health care provider.  Do not use any products that contain nicotine or tobacco, such as cigarettes and e-cigarettes. If you need help quitting, ask your health care provider.  Work with a Social worker or diabetes educator to identify strategies to manage stress and any emotional and social challenges. Questions to ask a health care provider  Do I need to meet with a diabetes educator?  Do I need to meet with a dietitian?  What number can I call if I have questions?  When are the best times to check my blood glucose? Where to find more  information:  American Diabetes Association: diabetes.org  Academy of Nutrition and Dietetics: www.eatright.[employer] of Diabetes and Digestive and Kidney Diseases (NIH): [employer] Summary  A healthy meal plan will help you control your blood glucose and maintain a healthy lifestyle.  Working with a diet and nutrition specialist (dietitian) can help you make a meal plan that is best for you.  Keep in mind that carbohydrates (carbs) and alcohol have immediate effects on your blood glucose levels. It is important to count carbs and to use alcohol carefully. This information is not intended to replace advice given to you by your health care provider. Make sure you discuss any questions you have with your health care provider. Document Revised: 08/22/2017 Document Reviewed: 10/14/2016 Elsevier Patient Education  2020 [place].  Diabetes Mellitus and Standards of Medical Care Managing diabetes (diabetes mellitus) can be complicated. Your diabetes treatment may be managed by a team of health care providers, including:  A physician who specializes in diabetes (endocrinologist).  A  nurse practitioner or physician assistant.  Nurses.  A diet and nutrition specialist (registered dietitian).  A certified diabetes educator (CDE).  An exercise specialist.  A pharmacist.  An eye doctor.  A foot specialist (podiatrist).  A dentist.  A primary care provider.  A mental health provider. Your health care providers follow guidelines to help you get the best quality of care. The following schedule is a general guideline for your diabetes management plan. Your health care providers may give you more specific instructions. Physical exams Upon being diagnosed with diabetes mellitus, and each year after that, your health care provider will ask about your medical and family history. He or she will also do a physical exam. Your exam may include:  Measuring your height,  weight, and body mass index (BMI).  Checking your blood pressure. This will be done at every routine medical visit. Your target blood pressure may vary depending on your medical conditions, your age, and other factors.  Thyroid gland exam.  Skin exam.  Screening for damage to your nerves (peripheral neuropathy). This may include checking the pulse in your legs and feet and checking the level of sensation in your hands and feet.  A complete foot exam to inspect the structure and skin of your feet, including checking for cuts, bruises, redness, blisters, sores, or other problems.  Screening for blood vessel (vascular) problems, which may include checking the pulse in your legs and feet and checking your temperature. Blood tests Depending on your treatment plan and your personal needs, you may have the following tests done:  HbA1c (hemoglobin A1c). This test provides information about blood sugar (glucose) control over the previous 2-3 months. It is used to adjust your treatment plan, if needed. This test will be done: ? At least 2 times a year, if you are meeting your treatment goals. ? 4 times a year, if you are not meeting your treatment goals or if treatment goals have changed.  Lipid testing, including total, LDL, and HDL cholesterol and triglyceride levels. ? The goal for LDL is less than 100 mg/dL (5.5 mmol/L). If you are at high risk for complications, the goal is less than 70 mg/dL (3.9 mmol/L). ? The goal for HDL is 40 mg/dL (2.2 mmol/L) or higher for men and 50 mg/dL (2.8 mmol/L) or higher for women. An HDL cholesterol of 60 mg/dL (3.3 mmol/L) or higher gives some protection against heart disease. ? The goal for triglycerides is less than 150 mg/dL (8.3 mmol/L).  Liver function tests.  Kidney function tests.  Thyroid function tests. Dental and eye exams  Visit your dentist two times a year.  If you have type 1 diabetes, your health care provider may recommend an eye exam 3-5  years after you are diagnosed, and then once a year after your first exam. ? For children with type 1 diabetes, a health care provider may recommend an eye exam when your child is age 64 or older and has had diabetes for 3-5 years. After the first exam, your child should get an eye exam once a year.  If you have type 2 diabetes, your health care provider may recommend an eye exam as soon as you are diagnosed, and then once a year after your first exam. Immunizations   The yearly flu (influenza) vaccine is recommended for everyone 6 months or older who has diabetes.  The pneumonia (pneumococcal) vaccine is recommended for everyone 2 years or older who has diabetes. If you are 74 or older,  you may get the pneumonia vaccine as a series of two separate shots.  The hepatitis B vaccine is recommended for adults shortly after being diagnosed with diabetes.  Adults and children with diabetes should receive all other vaccines according to age-specific recommendations from the Centers for Disease Control and Prevention (CDC). Mental and emotional health Screening for symptoms of eating disorders, anxiety, and depression is recommended at the time of diagnosis and afterward as needed. If your screening shows that you have symptoms (positive screening result), you may need more evaluation and you may work with a mental health care provider. Treatment plan Your treatment plan will be reviewed at every medical visit. You and your health care provider will discuss:  How you are taking your medicines, including insulin.  Any side effects you are experiencing.  Your blood glucose target goals.  The frequency of your blood glucose monitoring.  Lifestyle habits, such as activity level as well as tobacco, alcohol, and substance use. Diabetes self-management education Your health care provider will assess how well you are monitoring your blood glucose levels and whether you are taking your insulin correctly.  He or she may refer you to:  A certified diabetes educator to manage your diabetes throughout your life, starting at diagnosis.  A registered dietitian who can create or review your personal nutrition plan.  An exercise specialist who can discuss your activity level and exercise plan. Summary  Managing diabetes (diabetes mellitus) can be complicated. Your diabetes treatment may be managed by a team of health care providers.  Your health care providers follow guidelines in order to help you get the best quality of care.  Standards of care including having regular physical exams, blood tests, blood pressure monitoring, immunizations, screening tests, and education about how to manage your diabetes.  Your health care providers may also give you more specific instructions based on your individual health. This information is not intended to replace advice given to you by your health care provider. Make sure you discuss any questions you have with your health care provider. Document Revised: 05/29/2018 Document Reviewed: 06/07/2016 Elsevier Patient Education  2020 [employer]  Managing Your Hypertension Hypertension is commonly called high blood pressure. This is when the force of your blood pressing against the walls of your arteries is too strong. Arteries are blood vessels that carry blood from your heart throughout your body. Hypertension forces the heart to work harder to pump blood, and may cause the arteries to become narrow or stiff. Having untreated or uncontrolled hypertension can cause heart attack, stroke, kidney disease, and other problems. What are blood pressure readings? A blood pressure reading consists of a higher number over a lower number. Ideally, your blood pressure should be below 120/80. The first ("top") number is called the systolic pressure. It is a measure of the pressure in your arteries as your heart beats. The second ("bottom") number is called the diastolic  pressure. It is a measure of the pressure in your arteries as the heart relaxes. What does my blood pressure reading mean? Blood pressure is classified into four stages. Based on your blood pressure reading, your health care provider may use the following stages to determine what type of treatment you need, if any. Systolic pressure and diastolic pressure are measured in a unit called mm Hg. Normal  Systolic pressure: below 120.  Diastolic pressure: below 80. Elevated  Systolic pressure: 120-129.  Diastolic pressure: below 80. Hypertension stage 1  Systolic pressure: 130-139.  Diastolic pressure: 80-89.  Hypertension stage 2  Systolic pressure: 140 or above.  Diastolic pressure: 90 or above. What health risks are associated with hypertension? Managing your hypertension is an important responsibility. Uncontrolled hypertension can lead to:  A heart attack.  A stroke.  A weakened blood vessel (aneurysm).  Heart failure.  Kidney damage.  Eye damage.  Metabolic syndrome.  Memory and concentration problems. What changes can I make to manage my hypertension? Hypertension can be managed by making lifestyle changes and possibly by taking medicines. Your health care provider will help you make a plan to bring your blood pressure within a normal range. Eating and drinking   Eat a diet that is high in fiber and potassium, and low in salt (sodium), added sugar, and fat. An example eating plan is called the DASH (Dietary Approaches to Stop Hypertension) diet. To eat this way: ? Eat plenty of fresh fruits and vegetables. Try to fill half of your plate at each meal with fruits and vegetables. ? Eat whole grains, such as whole wheat pasta, brown rice, or whole grain bread. Fill about one quarter of your plate with whole grains. ? Eat low-fat diary products. ? Avoid fatty cuts of meat, processed or cured meats, and poultry with skin. Fill about one quarter of your plate with lean  proteins such as fish, chicken without skin, beans, eggs, and tofu. ? Avoid premade and processed foods. These tend to be higher in sodium, added sugar, and fat.  Reduce your daily sodium intake. Most people with hypertension should eat less than 1,500 mg of sodium a day.  Limit alcohol intake to no more than 1 drink a day for nonpregnant women and 2 drinks a day for men. One drink equals 12 oz of beer, 5 oz of wine, or 1 oz of hard liquor. Lifestyle  Work with your health care provider to maintain a healthy body weight, or to lose weight. Ask what an ideal weight is for you.  Get at least 30 minutes of exercise that causes your heart to beat faster (aerobic exercise) most days of the week. Activities may include walking, swimming, or biking.  Include exercise to strengthen your muscles (resistance exercise), such as weight lifting, as part of your weekly exercise routine. Try to do these types of exercises for 30 minutes at least 3 days a week.  Do not use any products that contain nicotine or tobacco, such as cigarettes and e-cigarettes. If you need help quitting, ask your health care provider.  Control any long-term (chronic) conditions you have, such as high cholesterol or diabetes. Monitoring  Monitor your blood pressure at home as told by your health care provider. Your personal target blood pressure may vary depending on your medical conditions, your age, and other factors.  Have your blood pressure checked regularly, as often as told by your health care provider. Working with your health care provider  Review all the medicines you take with your health care provider because there may be side effects or interactions.  Talk with your health care provider about your diet, exercise habits, and other lifestyle factors that may be contributing to hypertension.  Visit your health care provider regularly. Your health care provider can help you create and adjust your plan for managing  hypertension. Will I need medicine to control my blood pressure? Your health care provider may prescribe medicine if lifestyle changes are not enough to get your blood pressure under control, and if:  Your systolic blood pressure is 130 or higher.  Your diastolic blood pressure is 80 or higher. Take medicines only as told by your health care provider. Follow the directions carefully. Blood pressure medicines must be taken as prescribed. The medicine does not work as well when you skip doses. Skipping doses also puts you at risk for problems. Contact a health care provider if:  You think you are having a reaction to medicines you have taken.  You have repeated (recurrent) headaches.  You feel dizzy.  You have swelling in your ankles.  You have trouble with your vision. Get help right away if:  You develop a severe headache or confusion.  You have unusual weakness or numbness, or you feel faint.  You have severe pain in your chest or abdomen.  You vomit repeatedly.  You have trouble breathing. Summary  Hypertension is when the force of blood pumping through your arteries is too strong. If this condition is not controlled, it may put you at risk for serious complications.  Your personal target blood pressure may vary depending on your medical conditions, your age, and other factors. For most people, a normal blood pressure is less than 120/80.  Hypertension is managed by lifestyle changes, medicines, or both. Lifestyle changes include weight loss, eating a healthy, low-sodium diet, exercising more, and limiting alcohol. This information is not intended to replace advice given to you by your health care provider. Make sure you discuss any questions you have with your health care provider. Document Revised: 01/01/2019 Document Reviewed: 08/07/2016 Elsevier Patient Education  2020 [employer].  Managing Your Hypertension Hypertension is commonly called high blood pressure. This  is when the force of your blood pressing against the walls of your arteries is too strong. Arteries are blood vessels that carry blood from your heart throughout your body. Hypertension forces the heart to work harder to pump blood, and may cause the arteries to become narrow or stiff. Having untreated or uncontrolled hypertension can cause heart attack, stroke, kidney disease, and other problems. What are blood pressure readings? A blood pressure reading consists of a higher number over a lower number. Ideally, your blood pressure should be below 120/80. The first ("top") number is called the systolic pressure. It is a measure of the pressure in your arteries as your heart beats. The second ("bottom") number is called the diastolic pressure. It is a measure of the pressure in your arteries as the heart relaxes. What does my blood pressure reading mean? Blood pressure is classified into four stages. Based on your blood pressure reading, your health care provider may use the following stages to determine what type of treatment you need, if any. Systolic pressure and diastolic pressure are measured in a unit called mm Hg. Normal  Systolic pressure: below 120.  Diastolic pressure: below 80. Elevated  Systolic pressure: 120-129.  Diastolic pressure: below 80. Hypertension stage 1  Systolic pressure: 130-139.  Diastolic pressure: 80-89. Hypertension stage 2  Systolic pressure: 140 or above.  Diastolic pressure: 90 or above. What health risks are associated with hypertension? Managing your hypertension is an important responsibility. Uncontrolled hypertension can lead to:  A heart attack.  A stroke.  A weakened blood vessel (aneurysm).  Heart failure.  Kidney damage.  Eye damage.  Metabolic syndrome.  Memory and concentration problems. What changes can I make to manage my hypertension? Hypertension can be managed by making lifestyle changes and possibly by taking medicines. Your  health care provider will help you make a plan to bring your blood pressure within  a normal range. Eating and drinking   Eat a diet that is high in fiber and potassium, and low in salt (sodium), added sugar, and fat. An example eating plan is called the DASH (Dietary Approaches to Stop Hypertension) diet. To eat this way: ? Eat plenty of fresh fruits and vegetables. Try to fill half of your plate at each meal with fruits and vegetables. ? Eat whole grains, such as whole wheat pasta, brown rice, or whole grain bread. Fill about one quarter of your plate with whole grains. ? Eat low-fat diary products. ? Avoid fatty cuts of meat, processed or cured meats, and poultry with skin. Fill about one quarter of your plate with lean proteins such as fish, chicken without skin, beans, eggs, and tofu. ? Avoid premade and processed foods. These tend to be higher in sodium, added sugar, and fat.  Reduce your daily sodium intake. Most people with hypertension should eat less than 1,500 mg of sodium a day.  Limit alcohol intake to no more than 1 drink a day for nonpregnant women and 2 drinks a day for men. One drink equals 12 oz of beer, 5 oz of wine, or 1 oz of hard liquor. Lifestyle  Work with your health care provider to maintain a healthy body weight, or to lose weight. Ask what an ideal weight is for you.  Get at least 30 minutes of exercise that causes your heart to beat faster (aerobic exercise) most days of the week. Activities may include walking, swimming, or biking.  Include exercise to strengthen your muscles (resistance exercise), such as weight lifting, as part of your weekly exercise routine. Try to do these types of exercises for 30 minutes at least 3 days a week.  Do not use any products that contain nicotine or tobacco, such as cigarettes and e-cigarettes. If you need help quitting, ask your health care provider.  Control any long-term (chronic) conditions you have, such as high cholesterol  or diabetes. Monitoring  Monitor your blood pressure at home as told by your health care provider. Your personal target blood pressure may vary depending on your medical conditions, your age, and other factors.  Have your blood pressure checked regularly, as often as told by your health care provider. Working with your health care provider  Review all the medicines you take with your health care provider because there may be side effects or interactions.  Talk with your health care provider about your diet, exercise habits, and other lifestyle factors that may be contributing to hypertension.  Visit your health care provider regularly. Your health care provider can help you create and adjust your plan for managing hypertension. Will I need medicine to control my blood pressure? Your health care provider may prescribe medicine if lifestyle changes are not enough to get your blood pressure under control, and if:  Your systolic blood pressure is 130 or higher.  Your diastolic blood pressure is 80 or higher. Take medicines only as told by your health care provider. Follow the directions carefully. Blood pressure medicines must be taken as prescribed. The medicine does not work as well when you skip doses. Skipping doses also puts you at risk for problems. Contact a health care provider if:  You think you are having a reaction to medicines you have taken.  You have repeated (recurrent) headaches.  You feel dizzy.  You have swelling in your ankles.  You have trouble with your vision. Get help right away if:  You develop a  severe headache or confusion.  You have unusual weakness or numbness, or you feel faint.  You have severe pain in your chest or abdomen.  You vomit repeatedly.  You have trouble breathing. Summary  Hypertension is when the force of blood pumping through your arteries is too strong. If this condition is not controlled, it may put you at risk for serious  complications.  Your personal target blood pressure may vary depending on your medical conditions, your age, and other factors. For most people, a normal blood pressure is less than 120/80.  Hypertension is managed by lifestyle changes, medicines, or both. Lifestyle changes include weight loss, eating a healthy, low-sodium diet, exercising more, and limiting alcohol. This information is not intended to replace advice given to you by your health care provider. Make sure you discuss any questions you have with your health care provider. Document Revised: 01/01/2019 Document Reviewed: 08/07/2016 Elsevier Patient Education  2020 [employer].

## 2019-12-02 LAB — HEPATITIS C ANTIBODY
Hepatitis C Ab: NONREACTIVE
SIGNAL TO CUT-OFF: 0.02 (ref <1.00)

## 2019-12-20 DIAGNOSIS — E119 Type 2 diabetes mellitus without complications: Secondary | ICD-10-CM | POA: Diagnosis not present

## 2019-12-20 LAB — HM DIABETES EYE EXAM

## 2020-02-26 ENCOUNTER — Other Ambulatory Visit: Payer: Self-pay | Admitting: Family Medicine

## 2020-03-02 ENCOUNTER — Encounter: Payer: Self-pay | Admitting: Family Medicine

## 2020-03-02 NOTE — Telephone Encounter (Signed)
Please advise 

## 2020-03-06 ENCOUNTER — Other Ambulatory Visit: Payer: Self-pay | Admitting: Family Medicine

## 2020-03-07 ENCOUNTER — Other Ambulatory Visit: Payer: Self-pay

## 2020-03-08 ENCOUNTER — Encounter: Payer: Self-pay | Admitting: Family Medicine

## 2020-03-08 ENCOUNTER — Ambulatory Visit (INDEPENDENT_AMBULATORY_CARE_PROVIDER_SITE_OTHER): Payer: BC Managed Care – PPO | Admitting: Family Medicine

## 2020-03-08 VITALS — BP 110/78 | HR 90 | Temp 97.8°F | Wt 182.0 lb

## 2020-03-08 DIAGNOSIS — E119 Type 2 diabetes mellitus without complications: Secondary | ICD-10-CM | POA: Diagnosis not present

## 2020-03-08 DIAGNOSIS — I10 Essential (primary) hypertension: Secondary | ICD-10-CM | POA: Diagnosis not present

## 2020-03-08 LAB — POCT GLYCOSYLATED HEMOGLOBIN (HGB A1C): Hemoglobin A1C: 6.6 % — AB (ref 4.0–5.6)

## 2020-03-08 MED ORDER — BLOOD GLUCOSE MONITOR KIT: PACK | 0 refills | Status: AC

## 2020-03-08 NOTE — Patient Instructions (Signed)
Diabetes Mellitus and Nutrition, Adult When you have diabetes (diabetes mellitus), it is very important to have healthy eating habits because your blood sugar (glucose) levels are greatly affected by what you eat and drink. Eating healthy foods in the appropriate amounts, at about the same times every day, can help you:  Control your blood glucose.  Lower your risk of heart disease.  Improve your blood pressure.  Reach or maintain a healthy weight. Every person with diabetes is different, and each person has different needs for a meal plan. Your health care provider may recommend that you work with a diet and nutrition specialist (dietitian) to make a meal plan that is best for you. Your meal plan may vary depending on factors such as:  The calories you need.  The medicines you take.  Your weight.  Your blood glucose, blood pressure, and cholesterol levels.  Your activity level.  Other health conditions you have, such as heart or kidney disease. How do carbohydrates affect me? Carbohydrates, also called carbs, affect your blood glucose level more than any other type of food. Eating carbs naturally raises the amount of glucose in your blood. Carb counting is a method for keeping track of how many carbs you eat. Counting carbs is important to keep your blood glucose at a healthy level, especially if you use insulin or take certain oral diabetes medicines. It is important to know how many carbs you can safely have in each meal. This is different for every person. Your dietitian can help you calculate how many carbs you should have at each meal and for each snack. Foods that contain carbs include:  Bread, cereal, rice, pasta, and crackers.  Potatoes and corn.  Peas, beans, and lentils.  Milk and yogurt.  Fruit and juice.  Desserts, such as cakes, cookies, ice cream, and candy. How does alcohol affect me? Alcohol can cause a sudden decrease in blood glucose (hypoglycemia),  especially if you use insulin or take certain oral diabetes medicines. Hypoglycemia can be a life-threatening condition. Symptoms of hypoglycemia (sleepiness, dizziness, and confusion) are similar to symptoms of having too much alcohol. If your health care provider says that alcohol is safe for you, follow these guidelines:  Limit alcohol intake to no more than 1 drink per day for nonpregnant women and 2 drinks per day for men. One drink equals 12 oz of beer, 5 oz of wine, or 1 oz of hard liquor.  Do not drink on an empty stomach.  Keep yourself hydrated with water, diet soda, or unsweetened iced tea.  Keep in mind that regular soda, juice, and other mixers may contain a lot of sugar and must be counted as carbs. What are tips for following this plan?  Reading food labels  Start by checking the serving size on the "Nutrition Facts" label of packaged foods and drinks. The amount of calories, carbs, fats, and other nutrients listed on the label is based on one serving of the item. Many items contain more than one serving per package.  Check the total grams (g) of carbs in one serving. You can calculate the number of servings of carbs in one serving by dividing the total carbs by 15. For example, if a food has 30 g of total carbs, it would be equal to 2 servings of carbs.  Check the number of grams (g) of saturated and trans fats in one serving. Choose foods that have low or no amount of these fats.  Check the number of   milligrams (mg) of salt (sodium) in one serving. Most people should limit total sodium intake to less than 2,300 mg per day.  Always check the nutrition information of foods labeled as "low-fat" or "nonfat". These foods may be higher in added sugar or refined carbs and should be avoided.  Talk to your dietitian to identify your daily goals for nutrients listed on the label. Shopping  Avoid buying canned, premade, or processed foods. These foods tend to be high in fat, sodium,  and added sugar.  Shop around the outside edge of the grocery store. This includes fresh fruits and vegetables, bulk grains, fresh meats, and fresh dairy. Cooking  Use low-heat cooking methods, such as baking, instead of high-heat cooking methods like deep frying.  Cook using healthy oils, such as olive, canola, or sunflower oil.  Avoid cooking with butter, cream, or high-fat meats. Meal planning  Eat meals and snacks regularly, preferably at the same times every day. Avoid going long periods of time without eating.  Eat foods high in fiber, such as fresh fruits, vegetables, beans, and whole grains. Talk to your dietitian about how many servings of carbs you can eat at each meal.  Eat 4-6 ounces (oz) of lean protein each day, such as lean meat, chicken, fish, eggs, or tofu. One oz of lean protein is equal to: ? 1 oz of meat, chicken, or fish. ? 1 egg. ?  cup of tofu.  Eat some foods each day that contain healthy fats, such as avocado, nuts, seeds, and fish. Lifestyle  Check your blood glucose regularly.  Exercise regularly as told by your health care provider. This may include: ? 150 minutes of moderate-intensity or vigorous-intensity exercise each week. This could be brisk walking, biking, or water aerobics. ? Stretching and doing strength exercises, such as yoga or weightlifting, at least 2 times a week.  Take medicines as told by your health care provider.  Do not use any products that contain nicotine or tobacco, such as cigarettes and e-cigarettes. If you need help quitting, ask your health care provider.  Work with a Social worker or diabetes educator to identify strategies to manage stress and any emotional and social challenges. Questions to ask a health care provider  Do I need to meet with a diabetes educator?  Do I need to meet with a dietitian?  What number can I call if I have questions?  When are the best times to check my blood glucose? Where to find more  information:  American Diabetes Association: diabetes.org  Academy of Nutrition and Dietetics: www.eatright.[employer] of Diabetes and Digestive and Kidney Diseases (NIH): [employer] Summary  A healthy meal plan will help you control your blood glucose and maintain a healthy lifestyle.  Working with a diet and nutrition specialist (dietitian) can help you make a meal plan that is best for you.  Keep in mind that carbohydrates (carbs) and alcohol have immediate effects on your blood glucose levels. It is important to count carbs and to use alcohol carefully. This information is not intended to replace advice given to you by your health care provider. Make sure you discuss any questions you have with your health care provider. Document Revised: 08/22/2017 Document Reviewed: 10/14/2016 Elsevier Patient Education  2020 [employer].  Diabetes Mellitus and Exercise Exercising regularly is important for your overall health, especially when you have diabetes (diabetes mellitus). Exercising is not only about losing weight. It has many other health benefits, such as increasing muscle strength and  bone density and reducing body fat and stress. This leads to improved fitness, flexibility, and endurance, all of which result in better overall health. Exercise has additional benefits for people with diabetes, including:  Reducing appetite.  Helping to lower and control blood glucose.  Lowering blood pressure.  Helping to control amounts of fatty substances (lipids) in the blood, such as cholesterol and triglycerides.  Helping the body to respond better to insulin (improving insulin sensitivity).  Reducing how much insulin the body needs.  Decreasing the risk for heart disease by: ? Lowering cholesterol and triglyceride levels. ? Increasing the levels of good cholesterol. ? Lowering blood glucose levels. What is my activity plan? Your health care provider or certified  diabetes educator can help you make a plan for the type and frequency of exercise (activity plan) that works for you. Make sure that you:  Do at least 150 minutes of moderate-intensity or vigorous-intensity exercise each week. This could be brisk walking, biking, or water aerobics. ? Do stretching and strength exercises, such as yoga or weightlifting, at least 2 times a week. ? Spread out your activity over at least 3 days of the week.  Get some form of physical activity every day. ? Do not go more than 2 days in a row without some kind of physical activity. ? Avoid being inactive for more than 30 minutes at a time. Take frequent breaks to walk or stretch.  Choose a type of exercise or activity that you enjoy, and set realistic goals.  Start slowly, and gradually increase the intensity of your exercise over time. What do I need to know about managing my diabetes?   Check your blood glucose before and after exercising. ? If your blood glucose is 240 mg/dL (13.3 mmol/L) or higher before you exercise, check your urine for ketones. If you have ketones in your urine, do not exercise until your blood glucose returns to normal. ? If your blood glucose is 100 mg/dL (5.6 mmol/L) or lower, eat a snack containing 15-20 grams of carbohydrate. Check your blood glucose 15 minutes after the snack to make sure that your level is above 100 mg/dL (5.6 mmol/L) before you start your exercise.  Know the symptoms of low blood glucose (hypoglycemia) and how to treat it. Your risk for hypoglycemia increases during and after exercise. Common symptoms of hypoglycemia can include: ? Hunger. ? Anxiety. ? Sweating and feeling clammy. ? Confusion. ? Dizziness or feeling light-headed. ? Increased heart rate or palpitations. ? Blurry vision. ? Tingling or numbness around the mouth, lips, or tongue. ? Tremors or shakes. ? Irritability.  Keep a rapid-acting carbohydrate snack available before, during, and after  exercise to help prevent or treat hypoglycemia.  Avoid injecting insulin into areas of the body that are going to be exercised. For example, avoid injecting insulin into: ? The arms, when playing tennis. ? The legs, when jogging.  Keep records of your exercise habits. Doing this can help you and your health care provider adjust your diabetes management plan as needed. Write down: ? Food that you eat before and after you exercise. ? Blood glucose levels before and after you exercise. ? The type and amount of exercise you have done. ? When your insulin is expected to peak, if you use insulin. Avoid exercising at times when your insulin is peaking.  When you start a new exercise or activity, work with your health care provider to make sure the activity is safe for you, and to adjust  insulin, medicines, or food intake as needed.  Drink plenty of water while you exercise to prevent dehydration or heat stroke. Drink enough fluid to keep your urine clear or pale yellow. Summary  Exercising regularly is important for your overall health, especially when you have diabetes (diabetes mellitus).  Exercising has many health benefits, such as increasing muscle strength and bone density and reducing body fat and stress.  Your health care provider or certified diabetes educator can help you make a plan for the type and frequency of exercise (activity plan) that works for you.  When you start a new exercise or activity, work with your health care provider to make sure the activity is safe for you, and to adjust your insulin, medicines, or food intake as needed. This information is not intended to replace advice given to you by your health care provider. Make sure you discuss any questions you have with your health care provider. Document Revised: 04/03/2017 Document Reviewed: 02/19/2016 Elsevier Patient Education  2020 Elsevier Inc.  

## 2020-03-08 NOTE — Progress Notes (Signed)
Subjective:    Patient ID: Jeanne [name], female    DOB: 1955/10/03, 64 y.o.   MRN: [id number]  No chief complaint on file.   HPI Patient was seen today for f/u.  Pt notes fluctuations in blood sugars.  A.m. readings 186, 147, 134, 141.  Patient typically 120s or less.  Patient denies changes in diet.  Exercising less as [facility]  Trying to walk regularly.  May have a smoothie or a bowl of fruit.  Went to nutrition class in the past so was marked portions.  Tries to eat heavier meal during the day and lighter meal at dinner.  Patient denies increased thirst, increased hunger, increased urination, headaches, dizziness, palpitations.  Past Medical History:  Diagnosis Date  . Anemia   . Arthritis   . Diabetes mellitus   . Hypercholesteremia 2011?  [place] Hypertension   . NSTEMI (non-ST elevated myocardial infarction) [facility]) December 2013   negative cath, normal LV function - felt to be vasospasm  . Thyroid disease     Allergies  Allergen Reactions  . Eggs Or Egg-Derived Products Other (See Comments)    Unknown reaction-patient states she was allergic to egg yolks when she was little. She can eat eggs now, but does not take flu shot or other egg-containing medications.  . Penicillins Other (See Comments)    Unknown-reaction as a child   . Pollen Extract     ROS General: Denies fever, chills, night sweats, changes in weight, changes in appetite HEENT: Denies headaches, ear pain, changes in vision, rhinorrhea, sore throat CV: Denies CP, palpitations, SOB, orthopnea Pulm: Denies SOB, cough, wheezing GI: Denies abdominal pain, nausea, vomiting, diarrhea, constipation GU: Denies dysuria, hematuria, frequency, vaginal discharge Msk: Denies muscle cramps, joint pains Neuro: Denies weakness, numbness, tingling Skin: Denies rashes, bruising Psych: Denies depression, anxiety, hallucinations    Objective:    Blood pressure 110/78, pulse 90,  temperature 97.8 F (36.6 C), temperature source Temporal, weight 182 lb (82.6 kg), SpO2 98 %.  Gen. Pleasant, well-nourished, in no distress, normal affect   HEENT: [place]/AT, face symmetric, no scleral icterus, PERRLA, EOMI, nares patent without drainage Lungs: no accessory muscle use, CTAB, no wheezes or rales Cardiovascular: RRR, no m/r/g, no peripheral edema Musculoskeletal: No deformities, no cyanosis or clubbing, normal tone Neuro:  A&Ox3, CN II-XII intact, normal gait Skin:  Warm, no lesions/ rash   Wt Readings from Last 3 Encounters:  03/08/20 182 lb (82.6 kg)  12/01/19 180 lb (81.6 kg)  06/02/19 175 lb (79.4 kg)    Lab Results  Component Value Date   WBC 4.8 06/02/2019   HGB 12.6 06/02/2019   HCT 37.9 06/02/2019   PLT 307.0 06/02/2019   GLUCOSE 107 (H) 12/01/2019   CHOL 146 06/02/2019   TRIG 60.0 06/02/2019   HDL 45.40 06/02/2019   LDLCALC 89 06/02/2019   ALT 16 08/02/2011   AST 16 08/02/2011   NA 140 12/01/2019   K 4.0 12/01/2019   CL 101 12/01/2019   CREATININE 0.78 12/01/2019   BUN 16 12/01/2019   CO2 30 12/01/2019   TSH 0.97 12/01/2019   INR 1.06 09/22/2012   HGBA1C 7.2 (H) 12/01/2019   MICROALBUR 1.1 12/01/2019    Assessment/Plan:  Controlled type 2 diabetes mellitus without complication, without long-term current use of insulin (HCC)  -Hemoglobin A1c 7.6% this visit -Continue Metformin 1000 mg twice daily and Januvia 100 mg daily -Continue lifestyle modifications and increasing physical  activity -pt to test glucometer with control.  If abnormal pt to get new meter.  rx given. -f/u in 1 month, sooner if needed. - Plan: POCT glycosylated hemoglobin (Hb A1C), blood glucose meter kit and supplies KIT  Essential hypertension -Continue Norvasc 5 mg, Coreg 3.125 mg twice daily, losartan-HCTZ 160-25 mg daily -continue lifestyle modifications  F/u in 1-2 months  Grier Mitts, MD

## 2020-03-20 DIAGNOSIS — M5032 Other cervical disc degeneration, mid-cervical region, unspecified level: Secondary | ICD-10-CM | POA: Diagnosis not present

## 2020-03-20 DIAGNOSIS — M4722 Other spondylosis with radiculopathy, cervical region: Secondary | ICD-10-CM | POA: Diagnosis not present

## 2020-03-20 DIAGNOSIS — M9907 Segmental and somatic dysfunction of upper extremity: Secondary | ICD-10-CM | POA: Diagnosis not present

## 2020-03-20 DIAGNOSIS — M9901 Segmental and somatic dysfunction of cervical region: Secondary | ICD-10-CM | POA: Diagnosis not present

## 2020-03-22 DIAGNOSIS — M5032 Other cervical disc degeneration, mid-cervical region, unspecified level: Secondary | ICD-10-CM | POA: Diagnosis not present

## 2020-03-22 DIAGNOSIS — M4722 Other spondylosis with radiculopathy, cervical region: Secondary | ICD-10-CM | POA: Diagnosis not present

## 2020-03-22 DIAGNOSIS — M9907 Segmental and somatic dysfunction of upper extremity: Secondary | ICD-10-CM | POA: Diagnosis not present

## 2020-03-22 DIAGNOSIS — M9901 Segmental and somatic dysfunction of cervical region: Secondary | ICD-10-CM | POA: Diagnosis not present

## 2020-03-23 DIAGNOSIS — M9901 Segmental and somatic dysfunction of cervical region: Secondary | ICD-10-CM | POA: Diagnosis not present

## 2020-03-23 DIAGNOSIS — M9907 Segmental and somatic dysfunction of upper extremity: Secondary | ICD-10-CM | POA: Diagnosis not present

## 2020-03-23 DIAGNOSIS — M4722 Other spondylosis with radiculopathy, cervical region: Secondary | ICD-10-CM | POA: Diagnosis not present

## 2020-03-23 DIAGNOSIS — M5032 Other cervical disc degeneration, mid-cervical region, unspecified level: Secondary | ICD-10-CM | POA: Diagnosis not present

## 2020-03-29 DIAGNOSIS — M4722 Other spondylosis with radiculopathy, cervical region: Secondary | ICD-10-CM | POA: Diagnosis not present

## 2020-03-29 DIAGNOSIS — M9907 Segmental and somatic dysfunction of upper extremity: Secondary | ICD-10-CM | POA: Diagnosis not present

## 2020-03-29 DIAGNOSIS — M5032 Other cervical disc degeneration, mid-cervical region, unspecified level: Secondary | ICD-10-CM | POA: Diagnosis not present

## 2020-03-29 DIAGNOSIS — M9901 Segmental and somatic dysfunction of cervical region: Secondary | ICD-10-CM | POA: Diagnosis not present

## 2020-04-10 DIAGNOSIS — M9901 Segmental and somatic dysfunction of cervical region: Secondary | ICD-10-CM | POA: Diagnosis not present

## 2020-04-10 DIAGNOSIS — M4722 Other spondylosis with radiculopathy, cervical region: Secondary | ICD-10-CM | POA: Diagnosis not present

## 2020-04-10 DIAGNOSIS — M5032 Other cervical disc degeneration, mid-cervical region, unspecified level: Secondary | ICD-10-CM | POA: Diagnosis not present

## 2020-04-10 DIAGNOSIS — M9907 Segmental and somatic dysfunction of upper extremity: Secondary | ICD-10-CM | POA: Diagnosis not present

## 2020-04-12 ENCOUNTER — Other Ambulatory Visit: Payer: Self-pay

## 2020-04-12 ENCOUNTER — Encounter: Payer: Self-pay | Admitting: Family Medicine

## 2020-04-12 ENCOUNTER — Ambulatory Visit: Payer: BC Managed Care – PPO | Admitting: Family Medicine

## 2020-04-12 VITALS — BP 110/80 | HR 94 | Temp 98.8°F | Wt 183.0 lb

## 2020-04-12 DIAGNOSIS — M25511 Pain in right shoulder: Secondary | ICD-10-CM | POA: Diagnosis not present

## 2020-04-12 DIAGNOSIS — I10 Essential (primary) hypertension: Secondary | ICD-10-CM

## 2020-04-12 DIAGNOSIS — E119 Type 2 diabetes mellitus without complications: Secondary | ICD-10-CM

## 2020-04-12 NOTE — Patient Instructions (Signed)
Shoulder Pain Many things can cause shoulder pain, including:  An injury to the shoulder.  Overuse of the shoulder.  Arthritis. The source of the pain can be:  Inflammation.  An injury to the shoulder joint.  An injury to a tendon, ligament, or bone. Follow these instructions at home: Pay attention to changes in your symptoms. Let your health care provider know about them. Follow these instructions to relieve your pain. If you have a sling:  Wear the sling as told by your health care provider. Remove it only as told by your health care provider.  Loosen the sling if your fingers tingle, become numb, or turn cold and blue.  Keep the sling clean.  If the sling is not waterproof: ? Do not let it get wet. Remove it to shower or bathe.  Move your arm as little as possible, but keep your hand moving to prevent swelling. Managing pain, stiffness, and swelling   If directed, put ice on the painful area: ? Put ice in a plastic bag. ? Place a towel between your skin and the bag. ? Leave the ice on for 20 minutes, 2-3 times per day. Stop applying ice if it does not help with the pain.  Squeeze a soft ball or a foam pad as much as possible. This helps to keep the shoulder from swelling. It also helps to strengthen the arm. General instructions  Take over-the-counter and prescription medicines only as told by your health care provider.  Keep all follow-up visits as told by your health care provider. This is important. Contact a health care provider if:  Your pain gets worse.  Your pain is not relieved with medicines.  New pain develops in your arm, hand, or fingers. Get help right away if:  Your arm, hand, or fingers: ? Tingle. ? Become numb. ? Become swollen. ? Become painful. ? Turn white or blue. Summary  Shoulder pain can be caused by an injury, overuse, or arthritis.  Pay attention to changes in your symptoms. Let your health care provider know about  them.  This condition may be treated with a sling, ice, and pain medicines.  Contact your health care provider if the pain gets worse or new pain develops. Get help right away if your arm, hand, or fingers tingle or become numb, swollen, or painful.  Keep all follow-up visits as told by your health care provider. This is important. This information is not intended to replace advice given to you by your health care provider. Make sure you discuss any questions you have with your health care provider. Document Revised: 03/24/2018 Document Reviewed: 03/24/2018 Elsevier Patient Education  [place].  Shoulder Range of Motion Exercises Shoulder range of motion (ROM) exercises are done to keep the shoulder moving freely or to increase movement. They are often recommended for people who have shoulder pain or stiffness or who are recovering from a shoulder surgery. Phase 1 exercises When you are able, do this exercise 1-2 times per day for 30-60 seconds in each direction, or as directed by your health care provider. Pendulum exercise To do this exercise while sitting: 1. Sit in a chair or at the edge of your bed with your feet flat on the floor. 2. Let your affected arm hang down in front of you over the edge of the bed or chair. 3. Relax your shoulder, arm, and hand. [place] your body so your arm gently swings in small circles. You can also use your  unaffected arm to start the motion. 5. Repeat changing the direction of the circles, swinging your arm left and right, and swinging your arm forward and back. To do this exercise while standing: 1. Stand next to a sturdy chair or table, and hold on to it with your hand on your unaffected side. 2. Bend forward at the waist. 3. Bend your knees slightly. 4. Relax your shoulder, arm, and hand. 5. While keeping your shoulder relaxed, use body motion to swing your arm in small circles. 6. Repeat changing the direction of the circles, swinging your  arm left and right, and swinging your arm forward and back. 7. Between exercises, stand up tall and take a short break to relax your lower back.  Phase 2 exercises Do these exercises 1-2 times per day or as told by your health care provider. Hold each stretch for 30 seconds, and repeat 3 times. Do the exercises with one or both arms as instructed by your health care provider. For these exercises, sit at a table with your hand and arm supported by the table. A chair that slides easily or has wheels can be helpful. External rotation 1. Turn your chair so that your affected side is nearest to the table. 2. Place your forearm on the table to your side. Bend your elbow about 90 at the elbow (right angle) and place your hand palm facing down on the table. Your elbow should be about 6 inches away from your side. 3. Keeping your arm on the table, lean your body forward. Abduction 1. Turn your chair so that your affected side is nearest to the table. 2. Place your forearm and hand on the table so that your thumb points toward the ceiling and your arm is straight out to your side. 3. Slide your hand out to the side and away from you, using your unaffected arm to do the work. 4. To increase the stretch, you can slide your chair away from the table. Flexion: forward stretch 1. Sit facing the table. Place your hand and elbow on the table in front of you. 2. Slide your hand forward and away from you, using your unaffected arm to do the work. 3. To increase the stretch, you can slide your chair backward. Phase 3 exercises Do these exercises 1-2 times per day or as told by your health care provider. Hold each stretch for 30 seconds, and repeat 3 times. Do the exercises with one or both arms as instructed by your health care provider. Cross-body stretch: posterior capsule stretch 1. Lift your arm straight out in front of you. 2. Bend your arm 90 at the elbow (right angle) so your forearm moves across your  body. 3. Use your other arm to gently pull the elbow across your body, toward your other shoulder. Wall climbs 1. Stand with your affected arm extended out to the side with your hand resting on a door frame. 2. Slide your hand slowly up the door frame. 3. To increase the stretch, step through the door frame. Keep your body upright and do not lean. Wand exercises You will need a cane, a piece of PVC pipe, or a sturdy wooden dowel for wand exercises. Flexion To do this exercise while standing: 1. Hold the wand with both of your hands, palms down. 2. Using the other arm to help, lift your arms up and over your head, if able. 3. Push upward with your other arm to gently increase the stretch. To do this exercise  while lying down: 1. Lie on your back with your elbows resting on the floor and the wand in both your hands. Your hands will be palm down, or pointing toward your feet. 2. Lift your hands toward the ceiling, using your unaffected arm to help if needed. 3. Bring your arms overhead as able, using your unaffected arm to help if needed. Internal rotation 1. Stand while holding the wand behind you with both hands. Your unaffected arm should be extended above your head with the arm of the affected side extended behind you at the level of your waist. The wand should be pointing straight up and down as you hold it. 2. Slowly pull the wand up behind your back by straightening the elbow of your unaffected arm and bending the elbow of your affected arm. External rotation 1. Lie on your back with your affected upper arm supported on a small pillow or rolled towel. When you first do this exercise, keep your upper arm close to your body. Over time, bring your arm up to a 90 angle out to the side. 2. Hold the wand across your stomach and with both hands palm up. Your elbow on your affected side should be bent at a 90 angle. 3. Use your unaffected side to help push your forearm away from you and toward  the floor. Keep your elbow on your affected side bent at a 90 angle. Contact a health care provider if you have:  New or increasing pain.  New numbness, tingling, weakness, or discoloration in your arm or hand. This information is not intended to replace advice given to you by your health care provider. Make sure you discuss any questions you have with your health care provider. Document Revised: 10/22/2017 Document Reviewed: 10/22/2017 Elsevier Patient Education  2020 [employer]  Living With Diabetes Diabetes (type 1 diabetes mellitus or type 2 diabetes mellitus) is a condition in which the body does not have enough of a hormone called insulin, or the body does not respond properly to insulin. Normally, insulin allows sugars (glucose) to enter cells in the body. The cells use glucose for energy. With diabetes, extra glucose builds up in the blood instead of going into cells, which results in high blood glucose (hyperglycemia). How to manage lifestyle changes Managing diabetes includes medical treatments as well as lifestyle changes. If diabetes is not managed well, serious physical and emotional complications can occur. Taking good care of yourself means that you are responsible for:  Monitoring glucose regularly.  Eating a healthy diet.  Exercising regularly.  Meeting with health care providers.  Taking medicines as directed. Some people may feel a lot of stress about managing their diabetes. This is known as emotional distress, and it is very common. Living with diabetes can place you at risk for emotional distress, depression, or anxiety. These disorders can be confusing and can make diabetes management more difficult. How to recognize stress Emotional distress Symptoms of emotional distress include:  Anger about having a diagnosis of diabetes.  Fear or frustration about your diagnosis and the changes you need to make to manage the condition.  Being overly worried about the  care that you need or the cost of the care that you need.  Feeling like you caused your condition by doing something wrong.  Fear of unpredictable situations, like low or high blood glucose.  Feeling judged by your health care providers.  Feeling very alone with the disease.  Getting too tired or worn out with  the demands of daily care. Depression Having diabetes means that you are at a higher risk for depression. Having depression also means that you are at a higher risk for diabetes. Your health care provider may test (screen) you for symptoms of depression. It is important to recognize depression symptoms and to start treatment for depression soon after it is diagnosed. The following are some symptoms of depression:  Loss of interest in things that you used to enjoy.  Trouble sleeping, or often waking up early and not being able to get back to sleep.  A change in appetite.  Feeling tired most of the day.  Feeling nervous and anxious.  Feeling guilty and worrying that you are a burden to others.  Feeling depressed more often than you do not feel that way.  Thoughts of hurting yourself or feeling that you want to die. If you have any of these symptoms for 2 weeks or longer, reach out to a health care provider. Follow these instructions at home: Managing emotional distress The following are some ways to manage emotional distress:  Talk with your health care provider or certified diabetes educator. Consider working with a counselor or therapist.  Learn as much as you can about diabetes and its treatment. Meet with a certified diabetes educator or take a class to learn how to manage your condition.  Keep a journal of your thoughts and concerns.  Accept that some things are out of your control.  Talk with other people who have diabetes. It can help to talk with others about the emotional distress that you feel.  Find ways to manage stress that work for you. These may include  art or music therapy, exercise, meditation, and hobbies.  Seek support from spiritual leaders, family, and friends. General instructions  Follow your diabetes management plan.  Keep all follow-up visits as told by your health care provider. This is important. Where to find support   Ask your health care provider to recommend a therapist who understands both depression and diabetes.  Search for information and support from the American Diabetes Association: www.diabetes.org  Find a certified diabetes educator and make an appointment through American Association of Diabetes Educators: www.diabeteseducator.org Get help right away if:  You have thoughts about hurting yourself or others. If you ever feel like you may hurt yourself or others, or have thoughts about taking your own life, get help right away. You can go to your nearest emergency department or call:  Your local emergency services (911 in the [place]  A suicide crisis helpline, such as the National Suicide Prevention Lifeline at 769-077-6176. This is open 24 hours a day. Summary  Diabetes (type 1 diabetes mellitus or type 2 diabetes mellitus) is a condition in which the body does not have enough of a hormone called insulin, or the body does not respond properly to insulin.  Living with diabetes puts you at risk for medical issues, and it also puts you at risk for emotional issues such as emotional distress, depression, and anxiety.  Recognizing the symptoms of emotional distress and depression may help you avoid problems with your diabetes control. It is important to start treatment for emotional distress and depression soon after they are diagnosed.  Having diabetes means that you are at a higher risk for depression. Ask your health care provider to recommend a therapist who understands both depression and diabetes.  If you experience symptoms of emotional distress or depression, it is important to discuss this with your  health care provider, certified diabetes educator, or therapist. This information is not intended to replace advice given to you by your health care provider. Make sure you discuss any questions you have with your health care provider. Document Revised: 09/21/2018 Document Reviewed: 01/23/2017 Elsevier Patient Education  2020 Else[employer] Inc.  Diabetes Mellitus and Nutrition, Adult When you have diabetes (diabetes mellitus), it is very important to have healthy eating habits because your blood sugar (glucose) levels are greatly affected by what you eat and drink. Eating healthy foods in the appropriate amounts, at about the same times every day, can help you:  Control your blood glucose.  Lower your risk of heart disease.  Improve your blood pressure.  Reach or maintain a healthy weight. Every person with diabetes is different, and each person has different needs for a meal plan. Your health care provider may recommend that you work with a diet and nutrition specialist (dietitian) to make a meal plan that is best for you. Your meal plan may vary depending on factors such as:  The calories you need.  The medicines you take.  Your weight.  Your blood glucose, blood pressure, and cholesterol levels.  Your activity level.  Other health conditions you have, such as heart or kidney disease. How do carbohydrates affect me? Carbohydrates, also called carbs, affect your blood glucose level more than any other type of food. Eating carbs naturally raises the amount of glucose in your blood. Carb counting is a method for keeping track of how many carbs you eat. Counting carbs is important to keep your blood glucose at a healthy level, especially if you use insulin or take certain oral diabetes medicines. It is important to know how many carbs you can safely have in each meal. This is different for every person. Your dietitian can help you calculate how many carbs you should have at each meal and for  each snack. Foods that contain carbs include:  Bread, cereal, rice, pasta, and crackers.  Potatoes and corn.  Peas, beans, and lentils.  Milk and yogurt.  Fruit and juice.  Desserts, such as cakes, cookies, ice cream, and candy. How does alcohol affect me? Alcohol can cause a sudden decrease in blood glucose (hypoglycemia), especially if you use insulin or take certain oral diabetes medicines. Hypoglycemia can be a life-threatening condition. Symptoms of hypoglycemia (sleepiness, dizziness, and confusion) are similar to symptoms of having too much alcohol. If your health care provider says that alcohol is safe for you, follow these guidelines:  Limit alcohol intake to no more than 1 drink per day for nonpregnant women and 2 drinks per day for men. One drink equals 12 oz of beer, 5 oz of wine, or 1 oz of hard liquor.  Do not drink on an empty stomach.  Keep yourself hydrated with water, diet soda, or unsweetened iced tea.  Keep in mind that regular soda, juice, and other mixers may contain a lot of sugar and must be counted as carbs. What are tips for following this plan?  Reading food labels  Start by checking the serving size on the "Nutrition Facts" label of packaged foods and drinks. The amount of calories, carbs, fats, and other nutrients listed on the label is based on one serving of the item. Many items contain more than one serving per package.  Check the total grams (g) of carbs in one serving. You can calculate the number of servings of carbs in one serving by dividing the total carbs by 15.  For example, if a food has 30 g of total carbs, it would be equal to 2 servings of carbs.  Check the number of grams (g) of saturated and trans fats in one serving. Choose foods that have low or no amount of these fats.  Check the number of milligrams (mg) of salt (sodium) in one serving. Most people should limit total sodium intake to less than 2,300 mg per day.  Always check the  nutrition information of foods labeled as "low-fat" or "nonfat". These foods may be higher in added sugar or refined carbs and should be avoided.  Talk to your dietitian to identify your daily goals for nutrients listed on the label. Shopping  Avoid buying canned, premade, or processed foods. These foods tend to be high in fat, sodium, and added sugar.  Shop around the outside edge of the grocery store. This includes fresh fruits and vegetables, bulk grains, fresh meats, and fresh dairy. Cooking  Use low-heat cooking methods, such as baking, instead of high-heat cooking methods like deep frying.  Cook using healthy oils, such as olive, canola, or sunflower oil.  Avoid cooking with butter, cream, or high-fat meats. Meal planning  Eat meals and snacks regularly, preferably at the same times every day. Avoid going long periods of time without eating.  Eat foods high in fiber, such as fresh fruits, vegetables, beans, and whole grains. Talk to your dietitian about how many servings of carbs you can eat at each meal.  Eat 4-6 ounces (oz) of lean protein each day, such as lean meat, chicken, fish, eggs, or tofu. One oz of lean protein is equal to: ? 1 oz of meat, chicken, or fish. ? 1 egg. ?  cup of tofu.  Eat some foods each day that contain healthy fats, such as avocado, nuts, seeds, and fish. Lifestyle  Check your blood glucose regularly.  Exercise regularly as told by your health care provider. This may include: ? 150 minutes of moderate-intensity or vigorous-intensity exercise each week. This could be brisk walking, biking, or water aerobics. ? Stretching and doing strength exercises, such as yoga or weightlifting, at least 2 times a week.  Take medicines as told by your health care provider.  Do not use any products that contain nicotine or tobacco, such as cigarettes and e-cigarettes. If you need help quitting, ask your health care provider.  Work with a Veterinary surgeoncounselor or diabetes  educator to identify strategies to manage stress and any emotional and social challenges. Questions to ask a health care provider  Do I need to meet with a diabetes educator?  Do I need to meet with a dietitian?  What number can I call if I have questions?  When are the best times to check my blood glucose? Where to find more information:  American Diabetes Association: diabetes.org  Academy of Nutrition and Dietetics: www.eatright.[employer]  National Institute of Diabetes and Digestive and Kidney Diseases (NIH): [employer].tnwww.niddk.nih.gov Summary  A healthy meal plan will help you control your blood glucose and maintain a healthy lifestyle.  Working with a diet and nutrition specialist (dietitian) can help you make a meal plan that is best for you.  Keep in mind that carbohydrates (carbs) and alcohol have immediate effects on your blood glucose levels. It is important to count carbs and to use alcohol carefully. This information is not intended to replace advice given to you by your health care provider. Make sure you discuss any questions you have with your health care provider. Document  Revised: 08/22/2017 Document Reviewed: 10/14/2016 Elsevier Patient Education  [place].

## 2020-04-12 NOTE — Progress Notes (Signed)
Subjective:    Patient ID: Jeanne [name], female    DOB: [birth year], 64 y.o.   MRN: [id number]  No chief complaint on file.   HPI Patient was seen today for f/u on DM.  Pt states bs typically 124-140s. Pt taking Januvia 100 mg and Metformin 1000 mg BID.  Pt denies hyper or hypoglycemia.  Trying to eat better.  BP controlled.  Taking Norvasc 5 mg, Coreg 3.125, and valsartan-HCTZ 160-25 mg. Denies dizziness, changes in vision, and CP.  Pt endorses R shoulder pain.  States it "catches".  Sensation worse at night with tingling and aching.  Voltaren helps some.  Past Medical History:  Diagnosis Date  . Anemia   . Arthritis   . Diabetes mellitus   . Hypercholesteremia 2011?  Marland Kitchen Hypertension   . NSTEMI (non-ST elevated myocardial infarction) [facility]) December 2013   negative cath, normal LV function - felt to be vasospasm  . Thyroid disease     Allergies  Allergen Reactions  . Eggs Or Egg-Derived Products Other (See Comments)    Unknown reaction-patient states she was allergic to egg yolks when she was little. She can eat eggs now, but does not take flu shot or other egg-containing medications.  . Penicillins Other (See Comments)    Unknown-reaction as a child   . Pollen Extract     ROS General: Denies fever, chills, night sweats, changes in weight, changes in appetite HEENT: Denies headaches, ear pain, changes in vision, rhinorrhea, sore throat CV: Denies CP, palpitations, SOB, orthopnea Pulm: Denies SOB, cough, wheezing GI: Denies abdominal pain, nausea, vomiting, diarrhea, constipation GU: Denies dysuria, hematuria, frequency, vaginal discharge Msk: Denies muscle cramps, joint pains  +R shoulder pain Neuro: Denies weakness, numbness  +R shoulder tingling Skin: Denies rashes, bruising Psych: Denies depression, anxiety, hallucinations    Objective:    Blood pressure 110/80, pulse 94, temperature 98.8 F (37.1 C), temperature source Oral, weight 183 lb (83 kg), SpO2 98  %.  Gen. Pleasant, well-nourished, in no distress, normal affect   HEENT: [place]/AT, face symmetric, no scleral icterus, PERRLA, EOMI, nares patent without drainage Lungs: no accessory muscle use, CTAB, no wheezes or rales Cardiovascular: RRR, no m/r/g, no peripheral edema Musculoskeletal: Normal ROM of R shoulder.  No TTP of shoulder.  No deformities, no cyanosis or clubbing, normal tone Neuro:  A&Ox3, CN II-XII intact, normal gait Skin:  Warm, no lesions/ rash   Wt Readings from Last 3 Encounters:  03/08/20 182 lb (82.6 kg)  12/01/19 180 lb (81.6 kg)  06/02/19 175 lb (79.4 kg)    Lab Results  Component Value Date   WBC 4.8 06/02/2019   HGB 12.6 06/02/2019   HCT 37.9 06/02/2019   PLT 307.0 06/02/2019   GLUCOSE 107 (H) 12/01/2019   CHOL 146 06/02/2019   TRIG 60.0 06/02/2019   HDL 45.40 06/02/2019   LDLCALC 89 06/02/2019   ALT 16 08/02/2011   AST 16 08/02/2011   NA 140 12/01/2019   K 4.0 12/01/2019   CL 101 12/01/2019   CREATININE 0.78 12/01/2019   BUN 16 12/01/2019   CO2 30 12/01/2019   TSH 0.97 12/01/2019   INR 1.06 09/22/2012   HGBA1C 6.6 (A) 03/08/2020   MICROALBUR 1.1 12/01/2019    Assessment/Plan:  Essential hypertension -controlled -continue lifestyle modifications -continue current medications: Coreg 3.125 mg BID, Norvasc 5 mg daily, and valsartan-HCTZ 160-25 mg -continue checking bp at home regularly.  Controlled type 2 diabetes mellitus without complication, without long-term current use  of insulin (HCC) -last hgb A1C 6.6% on 03/08/20 -Discussed lifestyle modifications -Continue current medications including Januvia 100 mg daily and Metformin 1000 mg twice daily -continue checking fsbs regularly  Acute pain of right shoulder -discussed possible causes including labral tear, arthritis, impingement syndrome -discussed supportive care including NSAIDs or Tylenol, heat, ice, stretching. Massage -ok to continue voltaren gel -Discussed imaging.  Pt wishes to  wait at this time.  Also consider PT and Ortho referral for continued symptoms -will continue to monitor  F/u prn in the next month(s)  Abbe Amsterdam, MD

## 2020-04-13 DIAGNOSIS — M9907 Segmental and somatic dysfunction of upper extremity: Secondary | ICD-10-CM | POA: Diagnosis not present

## 2020-04-13 DIAGNOSIS — M4722 Other spondylosis with radiculopathy, cervical region: Secondary | ICD-10-CM | POA: Diagnosis not present

## 2020-04-13 DIAGNOSIS — M9901 Segmental and somatic dysfunction of cervical region: Secondary | ICD-10-CM | POA: Diagnosis not present

## 2020-04-13 DIAGNOSIS — M5032 Other cervical disc degeneration, mid-cervical region, unspecified level: Secondary | ICD-10-CM | POA: Diagnosis not present

## 2020-04-17 DIAGNOSIS — M9901 Segmental and somatic dysfunction of cervical region: Secondary | ICD-10-CM | POA: Diagnosis not present

## 2020-04-17 DIAGNOSIS — M5032 Other cervical disc degeneration, mid-cervical region, unspecified level: Secondary | ICD-10-CM | POA: Diagnosis not present

## 2020-04-17 DIAGNOSIS — M9907 Segmental and somatic dysfunction of upper extremity: Secondary | ICD-10-CM | POA: Diagnosis not present

## 2020-04-17 DIAGNOSIS — M4722 Other spondylosis with radiculopathy, cervical region: Secondary | ICD-10-CM | POA: Diagnosis not present

## 2020-04-18 NOTE — Progress Notes (Deleted)
Subjective:    Patient ID: Jeanne [name], female    DOB: 19-May-1956, 64 y.o.   MRN: [id number]  No chief complaint on file.   HPI Patient was seen today for f/u on DM.  Pt states bs typicaly 124-140s.  Pt taking Januvia 100 mg and metformin 1000 mg BID.  Pt denies hyper or hypoglycemia.  Trying to eat better.  BP controlled.  Taking norvasc 5 mg, coreg 3.125 BID, and valsartan-HCTZ 160-25 mg daily.  Denies dizziness, changes in vision, CP.  Pt endorses R shoulder pain/ it "catches".  Sensation worse at night with tingling and aching.  Voltaren helps some.  Past Medical History:  Diagnosis Date  . Anemia   . Arthritis   . Diabetes mellitus   . Hypercholesteremia 2011?  Marland Kitchen Hypertension   . NSTEMI (non-ST elevated myocardial infarction) [facility]) December 2013   negative cath, normal LV function - felt to be vasospasm  . Thyroid disease     Allergies  Allergen Reactions  . Eggs Or Egg-Derived Products Other (See Comments)    Unknown reaction-patient states she was allergic to egg yolks when she was little. She can eat eggs now, but does not take flu shot or other egg-containing medications.  . Penicillins Other (See Comments)    Unknown-reaction as a child   . Pollen Extract     ROS General: Denies fever, chills, night sweats, changes in weight, changes in appetite HEENT: Denies headaches, ear pain, changes in vision, rhinorrhea, sore throat CV: Denies CP, palpitations, SOB, orthopnea Pulm: Denies SOB, cough, wheezing GI: Denies abdominal pain, nausea, vomiting, diarrhea, constipation GU: Denies dysuria, hematuria, frequency, vaginal discharge Msk: Denies muscle cramps, joint pains Neuro: Denies weakness, numbness, tingling  +R shoulder pain, tingling Skin: Denies rashes, bruising Psych: Denies depression, anxiety, hallucinations      Objective:    Blood pressure 110/80, pulse 94, temperature 98.8 F (37.1 C), temperature source Oral, weight 183 lb (83 kg), SpO2 98  %.   Gen. Pleasant, well-nourished, in no distress, normal affect   HEENT: [place]/AT, face symmetric, no scleral icterus, PERRLA, EOMI, nares patent without drainage. Lungs: no accessory muscle use, CTAB, no wheezes or rales Cardiovascular: RRR, no m/r/g, no peripheral edema Abdomen: BS present, soft, NT/ND Musculoskeletal: Normal ROM of R shoulder.  No TTP.  No deformities, no cyanosis or clubbing, normal tone Neuro:  A&Ox3, CN II-XII intact, normal gait Skin:  Warm, no lesions/ rash   Wt Readings from Last 3 Encounters:  04/12/20 183 lb (83 kg)  03/08/20 182 lb (82.6 kg)  12/01/19 180 lb (81.6 kg)    Lab Results  Component Value Date   WBC 4.8 06/02/2019   HGB 12.6 06/02/2019   HCT 37.9 06/02/2019   PLT 307.0 06/02/2019   GLUCOSE 107 (H) 12/01/2019   CHOL 146 06/02/2019   TRIG 60.0 06/02/2019   HDL 45.40 06/02/2019   LDLCALC 89 06/02/2019   ALT 16 08/02/2011   AST 16 08/02/2011   NA 140 12/01/2019   K 4.0 12/01/2019   CL 101 12/01/2019   CREATININE 0.78 12/01/2019   BUN 16 12/01/2019   CO2 30 12/01/2019   TSH 0.97 12/01/2019   INR 1.06 09/22/2012   HGBA1C 6.6 (A) 03/08/2020   MICROALBUR 1.1 12/01/2019    Assessment/Plan:  Essential hypertension -controlled.   -continue current medsL coreg 3.125 mg BID, norvasc 5 mg daily, and valsartan-HCTZ 160-25 mg daily -c  Controlled type 2 diabetes mellitus without complication, without long-term current use  of insulin (HCC) -hgb A1C 6.6.% on 03/08/20 -discussed lifestyle modifications -continue current medications: januvia 100 mg daily, Metformin 1000 mg BID -continue checking fsbs regularly  Acute pain of right shoulder -discussed possible causes including labral tear, arthritis, impingement syndrome -supportive care: NSAIDs or Tylenol, ice, heat, stretching, massage -ok to continue Voltaren gel. -given handouts including exercises -consider imaging ie xray and PT for continued symptoms -f/u prn  F/u prn in the  next few months.  Abbe Amsterdam, MD

## 2020-04-20 DIAGNOSIS — M9907 Segmental and somatic dysfunction of upper extremity: Secondary | ICD-10-CM | POA: Diagnosis not present

## 2020-04-20 DIAGNOSIS — M9901 Segmental and somatic dysfunction of cervical region: Secondary | ICD-10-CM | POA: Diagnosis not present

## 2020-04-20 DIAGNOSIS — M5032 Other cervical disc degeneration, mid-cervical region, unspecified level: Secondary | ICD-10-CM | POA: Diagnosis not present

## 2020-04-20 DIAGNOSIS — M4722 Other spondylosis with radiculopathy, cervical region: Secondary | ICD-10-CM | POA: Diagnosis not present

## 2020-04-24 DIAGNOSIS — M9907 Segmental and somatic dysfunction of upper extremity: Secondary | ICD-10-CM | POA: Diagnosis not present

## 2020-04-24 DIAGNOSIS — M9901 Segmental and somatic dysfunction of cervical region: Secondary | ICD-10-CM | POA: Diagnosis not present

## 2020-04-24 DIAGNOSIS — M5032 Other cervical disc degeneration, mid-cervical region, unspecified level: Secondary | ICD-10-CM | POA: Diagnosis not present

## 2020-04-24 DIAGNOSIS — M4722 Other spondylosis with radiculopathy, cervical region: Secondary | ICD-10-CM | POA: Diagnosis not present

## 2020-04-27 DIAGNOSIS — M9907 Segmental and somatic dysfunction of upper extremity: Secondary | ICD-10-CM | POA: Diagnosis not present

## 2020-04-27 DIAGNOSIS — M5032 Other cervical disc degeneration, mid-cervical region, unspecified level: Secondary | ICD-10-CM | POA: Diagnosis not present

## 2020-04-27 DIAGNOSIS — M4722 Other spondylosis with radiculopathy, cervical region: Secondary | ICD-10-CM | POA: Diagnosis not present

## 2020-04-27 DIAGNOSIS — M9901 Segmental and somatic dysfunction of cervical region: Secondary | ICD-10-CM | POA: Diagnosis not present

## 2020-04-30 ENCOUNTER — Other Ambulatory Visit: Payer: Self-pay | Admitting: Family Medicine

## 2020-05-01 ENCOUNTER — Other Ambulatory Visit: Payer: Self-pay

## 2020-05-01 DIAGNOSIS — M5032 Other cervical disc degeneration, mid-cervical region, unspecified level: Secondary | ICD-10-CM | POA: Diagnosis not present

## 2020-05-01 DIAGNOSIS — M9907 Segmental and somatic dysfunction of upper extremity: Secondary | ICD-10-CM | POA: Diagnosis not present

## 2020-05-01 DIAGNOSIS — M9901 Segmental and somatic dysfunction of cervical region: Secondary | ICD-10-CM | POA: Diagnosis not present

## 2020-05-01 DIAGNOSIS — M4722 Other spondylosis with radiculopathy, cervical region: Secondary | ICD-10-CM | POA: Diagnosis not present

## 2020-05-04 DIAGNOSIS — M9907 Segmental and somatic dysfunction of upper extremity: Secondary | ICD-10-CM | POA: Diagnosis not present

## 2020-05-04 DIAGNOSIS — M9901 Segmental and somatic dysfunction of cervical region: Secondary | ICD-10-CM | POA: Diagnosis not present

## 2020-05-04 DIAGNOSIS — M5032 Other cervical disc degeneration, mid-cervical region, unspecified level: Secondary | ICD-10-CM | POA: Diagnosis not present

## 2020-05-04 DIAGNOSIS — M4722 Other spondylosis with radiculopathy, cervical region: Secondary | ICD-10-CM | POA: Diagnosis not present

## 2020-05-22 ENCOUNTER — Other Ambulatory Visit: Payer: Self-pay | Admitting: Family Medicine

## 2020-05-23 ENCOUNTER — Other Ambulatory Visit: Payer: Self-pay | Admitting: Cardiovascular Disease

## 2020-05-24 DIAGNOSIS — M4722 Other spondylosis with radiculopathy, cervical region: Secondary | ICD-10-CM | POA: Diagnosis not present

## 2020-05-24 DIAGNOSIS — M9901 Segmental and somatic dysfunction of cervical region: Secondary | ICD-10-CM | POA: Diagnosis not present

## 2020-05-24 DIAGNOSIS — M5032 Other cervical disc degeneration, mid-cervical region, unspecified level: Secondary | ICD-10-CM | POA: Diagnosis not present

## 2020-05-24 DIAGNOSIS — M9907 Segmental and somatic dysfunction of upper extremity: Secondary | ICD-10-CM | POA: Diagnosis not present

## 2020-05-28 ENCOUNTER — Other Ambulatory Visit: Payer: Self-pay | Admitting: Family Medicine

## 2020-05-30 DIAGNOSIS — M4722 Other spondylosis with radiculopathy, cervical region: Secondary | ICD-10-CM | POA: Diagnosis not present

## 2020-05-30 DIAGNOSIS — M9907 Segmental and somatic dysfunction of upper extremity: Secondary | ICD-10-CM | POA: Diagnosis not present

## 2020-05-30 DIAGNOSIS — M9901 Segmental and somatic dysfunction of cervical region: Secondary | ICD-10-CM | POA: Diagnosis not present

## 2020-05-30 DIAGNOSIS — M5032 Other cervical disc degeneration, mid-cervical region, unspecified level: Secondary | ICD-10-CM | POA: Diagnosis not present

## 2020-05-31 ENCOUNTER — Other Ambulatory Visit: Payer: Self-pay

## 2020-05-31 MED ORDER — AMLODIPINE BESYLATE 5 MG PO TABS
5.0000 mg | ORAL_TABLET | Freq: Every day | ORAL | 0 refills | Status: DC
Start: 2020-05-31 — End: 2020-06-28

## 2020-05-31 MED ORDER — CARVEDILOL 3.125 MG PO TABS
3.1250 mg | ORAL_TABLET | Freq: Two times a day (BID) | ORAL | 0 refills | Status: DC
Start: 2020-05-31 — End: 2020-07-24

## 2020-06-01 DIAGNOSIS — M9901 Segmental and somatic dysfunction of cervical region: Secondary | ICD-10-CM | POA: Diagnosis not present

## 2020-06-01 DIAGNOSIS — M9907 Segmental and somatic dysfunction of upper extremity: Secondary | ICD-10-CM | POA: Diagnosis not present

## 2020-06-01 DIAGNOSIS — M4722 Other spondylosis with radiculopathy, cervical region: Secondary | ICD-10-CM | POA: Diagnosis not present

## 2020-06-01 DIAGNOSIS — M5032 Other cervical disc degeneration, mid-cervical region, unspecified level: Secondary | ICD-10-CM | POA: Diagnosis not present

## 2020-06-05 DIAGNOSIS — M9907 Segmental and somatic dysfunction of upper extremity: Secondary | ICD-10-CM | POA: Diagnosis not present

## 2020-06-05 DIAGNOSIS — M4722 Other spondylosis with radiculopathy, cervical region: Secondary | ICD-10-CM | POA: Diagnosis not present

## 2020-06-05 DIAGNOSIS — M9901 Segmental and somatic dysfunction of cervical region: Secondary | ICD-10-CM | POA: Diagnosis not present

## 2020-06-05 DIAGNOSIS — M5032 Other cervical disc degeneration, mid-cervical region, unspecified level: Secondary | ICD-10-CM | POA: Diagnosis not present

## 2020-06-06 ENCOUNTER — Other Ambulatory Visit: Payer: Self-pay

## 2020-06-07 ENCOUNTER — Encounter: Payer: Self-pay | Admitting: Family Medicine

## 2020-06-07 ENCOUNTER — Ambulatory Visit: Payer: BC Managed Care – PPO | Admitting: Family Medicine

## 2020-06-07 VITALS — BP 116/70 | HR 90 | Temp 98.7°F | Wt 183.0 lb

## 2020-06-07 DIAGNOSIS — E890 Postprocedural hypothyroidism: Secondary | ICD-10-CM

## 2020-06-07 DIAGNOSIS — E1169 Type 2 diabetes mellitus with other specified complication: Secondary | ICD-10-CM | POA: Diagnosis not present

## 2020-06-07 DIAGNOSIS — E785 Hyperlipidemia, unspecified: Secondary | ICD-10-CM

## 2020-06-07 DIAGNOSIS — I10 Essential (primary) hypertension: Secondary | ICD-10-CM

## 2020-06-07 DIAGNOSIS — F320 Major depressive disorder, single episode, mild: Secondary | ICD-10-CM | POA: Diagnosis not present

## 2020-06-07 NOTE — Patient Instructions (Addendum)
Diabetes Mellitus and Nutrition, Adult When you have diabetes (diabetes mellitus), it is very important to have healthy eating habits because your blood sugar (glucose) levels are greatly affected by what you eat and drink. Eating healthy foods in the appropriate amounts, at about the same times every day, can help you:  Control your blood glucose.  Lower your risk of heart disease.  Improve your blood pressure.  Reach or maintain a healthy weight. Every person with diabetes is different, and each person has different needs for a meal plan. Your health care provider may recommend that you work with a diet and nutrition specialist (dietitian) to make a meal plan that is best for you. Your meal plan may vary depending on factors such as:  The calories you need.  The medicines you take.  Your weight.  Your blood glucose, blood pressure, and cholesterol levels.  Your activity level.  Other health conditions you have, such as heart or kidney disease. How do carbohydrates affect me? Carbohydrates, also called carbs, affect your blood glucose level more than any other type of food. Eating carbs naturally raises the amount of glucose in your blood. Carb counting is a method for keeping track of how many carbs you eat. Counting carbs is important to keep your blood glucose at a healthy level, especially if you use insulin or take certain oral diabetes medicines. It is important to know how many carbs you can safely have in each meal. This is different for every person. Your dietitian can help you calculate how many carbs you should have at each meal and for each snack. Foods that contain carbs include:  Bread, cereal, rice, pasta, and crackers.  Potatoes and corn.  Peas, beans, and lentils.  Milk and yogurt.  Fruit and juice.  Desserts, such as cakes, cookies, ice cream, and candy. How does alcohol affect me? Alcohol can cause a sudden decrease in blood glucose (hypoglycemia),  especially if you use insulin or take certain oral diabetes medicines. Hypoglycemia can be a life-threatening condition. Symptoms of hypoglycemia (sleepiness, dizziness, and confusion) are similar to symptoms of having too much alcohol. If your health care provider says that alcohol is safe for you, follow these guidelines:  Limit alcohol intake to no more than 1 drink per day for nonpregnant women and 2 drinks per day for men. One drink equals 12 oz of beer, 5 oz of wine, or 1 oz of hard liquor.  Do not drink on an empty stomach.  Keep yourself hydrated with water, diet soda, or unsweetened iced tea.  Keep in mind that regular soda, juice, and other mixers may contain a lot of sugar and must be counted as carbs. What are tips for following this plan?  Reading food labels  Start by checking the serving size on the "Nutrition Facts" label of packaged foods and drinks. The amount of calories, carbs, fats, and other nutrients listed on the label is based on one serving of the item. Many items contain more than one serving per package.  Check the total grams (g) of carbs in one serving. You can calculate the number of servings of carbs in one serving by dividing the total carbs by 15. For example, if a food has 30 g of total carbs, it would be equal to 2 servings of carbs.  Check the number of grams (g) of saturated and trans fats in one serving. Choose foods that have low or no amount of these fats.  Check the number of   milligrams (mg) of salt (sodium) in one serving. Most people should limit total sodium intake to less than 2,300 mg per day.  Always check the nutrition information of foods labeled as "low-fat" or "nonfat". These foods may be higher in added sugar or refined carbs and should be avoided.  Talk to your dietitian to identify your daily goals for nutrients listed on the label. Shopping  Avoid buying canned, premade, or processed foods. These foods tend to be high in fat, sodium,  and added sugar.  Shop around the outside edge of the grocery store. This includes fresh fruits and vegetables, bulk grains, fresh meats, and fresh dairy. Cooking  Use low-heat cooking methods, such as baking, instead of high-heat cooking methods like deep frying.  Cook using healthy oils, such as olive, canola, or sunflower oil.  Avoid cooking with butter, cream, or high-fat meats. Meal planning  Eat meals and snacks regularly, preferably at the same times every day. Avoid going long periods of time without eating.  Eat foods high in fiber, such as fresh fruits, vegetables, beans, and whole grains. Talk to your dietitian about how many servings of carbs you can eat at each meal.  Eat 4-6 ounces (oz) of lean protein each day, such as lean meat, chicken, fish, eggs, or tofu. One oz of lean protein is equal to: ? 1 oz of meat, chicken, or fish. ? 1 egg. ?  cup of tofu.  Eat some foods each day that contain healthy fats, such as avocado, nuts, seeds, and fish. Lifestyle  Check your blood glucose regularly.  Exercise regularly as told by your health care provider. This may include: ? 150 minutes of moderate-intensity or vigorous-intensity exercise each week. This could be brisk walking, biking, or water aerobics. ? Stretching and doing strength exercises, such as yoga or weightlifting, at least 2 times a week.  Take medicines as told by your health care provider.  Do not use any products that contain nicotine or tobacco, such as cigarettes and e-cigarettes. If you need help quitting, ask your health care provider.  Work with a Social worker or diabetes educator to identify strategies to manage stress and any emotional and social challenges. Questions to ask a health care provider  Do I need to meet with a diabetes educator?  Do I need to meet with a dietitian?  What number can I call if I have questions?  When are the best times to check my blood glucose? Where to find more  information:  American Diabetes Association: diabetes.org  Academy of Nutrition and Dietetics: www.eatright.[employer] of Diabetes and Digestive and Kidney Diseases (NIH): [employer] Summary  A healthy meal plan will help you control your blood glucose and maintain a healthy lifestyle.  Working with a diet and nutrition specialist (dietitian) can help you make a meal plan that is best for you.  Keep in mind that carbohydrates (carbs) and alcohol have immediate effects on your blood glucose levels. It is important to count carbs and to use alcohol carefully. This information is not intended to replace advice given to you by your health care provider. Make sure you discuss any questions you have with your health care provider. Document Revised: 08/22/2017 Document Reviewed: 10/14/2016 Elsevier Patient Education  2020 [employer].  Diabetes Mellitus and Exercise Exercising regularly is important for your overall health, especially when you have diabetes (diabetes mellitus). Exercising is not only about losing weight. It has many other health benefits, such as increasing muscle strength and  bone density and reducing body fat and stress. This leads to improved fitness, flexibility, and endurance, all of which result in better overall health. Exercise has additional benefits for people with diabetes, including:  Reducing appetite.  Helping to lower and control blood glucose.  Lowering blood pressure.  Helping to control amounts of fatty substances (lipids) in the blood, such as cholesterol and triglycerides.  Helping the body to respond better to insulin (improving insulin sensitivity).  Reducing how much insulin the body needs.  Decreasing the risk for heart disease by: ? Lowering cholesterol and triglyceride levels. ? Increasing the levels of good cholesterol. ? Lowering blood glucose levels. What is my activity plan? Your health care provider or certified  diabetes educator can help you make a plan for the type and frequency of exercise (activity plan) that works for you. Make sure that you:  Do at least 150 minutes of moderate-intensity or vigorous-intensity exercise each week. This could be brisk walking, biking, or water aerobics. ? Do stretching and strength exercises, such as yoga or weightlifting, at least 2 times a week. ? Spread out your activity over at least 3 days of the week.  Get some form of physical activity every day. ? Do not go more than 2 days in a row without some kind of physical activity. ? Avoid being inactive for more than 30 minutes at a time. Take frequent breaks to walk or stretch.  Choose a type of exercise or activity that you enjoy, and set realistic goals.  Start slowly, and gradually increase the intensity of your exercise over time. What do I need to know about managing my diabetes?   Check your blood glucose before and after exercising. ? If your blood glucose is 240 mg/dL (13.3 mmol/L) or higher before you exercise, check your urine for ketones. If you have ketones in your urine, do not exercise until your blood glucose returns to normal. ? If your blood glucose is 100 mg/dL (5.6 mmol/L) or lower, eat a snack containing 15-20 grams of carbohydrate. Check your blood glucose 15 minutes after the snack to make sure that your level is above 100 mg/dL (5.6 mmol/L) before you start your exercise.  Know the symptoms of low blood glucose (hypoglycemia) and how to treat it. Your risk for hypoglycemia increases during and after exercise. Common symptoms of hypoglycemia can include: ? Hunger. ? Anxiety. ? Sweating and feeling clammy. ? Confusion. ? Dizziness or feeling light-headed. ? Increased heart rate or palpitations. ? Blurry vision. ? Tingling or numbness around the mouth, lips, or tongue. ? Tremors or shakes. ? Irritability.  Keep a rapid-acting carbohydrate snack available before, during, and after  exercise to help prevent or treat hypoglycemia.  Avoid injecting insulin into areas of the body that are going to be exercised. For example, avoid injecting insulin into: ? The arms, when playing tennis. ? The legs, when jogging.  Keep records of your exercise habits. Doing this can help you and your health care provider adjust your diabetes management plan as needed. Write down: ? Food that you eat before and after you exercise. ? Blood glucose levels before and after you exercise. ? The type and amount of exercise you have done. ? When your insulin is expected to peak, if you use insulin. Avoid exercising at times when your insulin is peaking.  When you start a new exercise or activity, work with your health care provider to make sure the activity is safe for you, and to adjust  your insulin, medicines, or food intake as needed.  Drink plenty of water while you exercise to prevent dehydration or heat stroke. Drink enough fluid to keep your urine clear or pale yellow. Summary  Exercising regularly is important for your overall health, especially when you have diabetes (diabetes mellitus).  Exercising has many health benefits, such as increasing muscle strength and bone density and reducing body fat and stress.  Your health care provider or certified diabetes educator can help you make a plan for the type and frequency of exercise (activity plan) that works for you.  When you start a new exercise or activity, work with your health care provider to make sure the activity is safe for you, and to adjust your insulin, medicines, or food intake as needed. This information is not intended to replace advice given to you by your health care provider. Make sure you discuss any questions you have with your health care provider. Document Revised: 04/03/2017 Document Reviewed: 02/19/2016 Elsevier Patient Education  2020 [place], Adult Stress is a normal reaction to life events. Stress  is what you feel when life demands more than you are used to, or more than you think you can handle. Some stress can be useful, such as studying for a test or meeting a deadline at work. Stress that occurs too often or for too long can cause problems. It can affect your emotional health and interfere with relationships and normal daily activities. Too much stress can weaken your body's defense system (immune system) and increase your risk for physical illness. If you already have a medical problem, stress can make it worse. What are the causes? All sorts of life events can cause stress. An event that causes stress for one person may not be stressful for another person. Major life events, whether positive or negative, commonly cause stress. Examples include:  Losing a job or starting a new job.  Losing a loved one.  Moving to a new town or home.  Getting married or divorced.  Having a baby.  Getting injured or sick. Less obvious life events can also cause stress, especially if they occur day after day or in combination with each other. Examples include:  Working long hours.  Driving in traffic.  Caring for children.  Being in debt.  Being in a difficult relationship. What are the signs or symptoms? Stress can cause emotional symptoms, including:  Anxiety. This is feeling worried, afraid, on edge, overwhelmed, or out of control.  Anger, including irritation or impatience.  Depression. This is feeling sad, down, helpless, or guilty.  Trouble focusing, remembering, or making decisions. Stress can cause physical symptoms, including:  Aches and pains. These may affect your head, neck, back, stomach, or other areas of your body.  Tight muscles or a clenched jaw.  Low energy.  Trouble sleeping. Stress can cause unhealthy behaviors, including:  Eating to feel better (overeating) or skipping meals.  Working too much or putting off tasks.  Smoking, drinking alcohol, or using  drugs to feel better. How is this diagnosed? Stress is diagnosed through an assessment by your health care provider. He or she may diagnose this condition based on:  Your symptoms and any stressful life events.  Your medical history.  Tests to rule out other causes of your symptoms. Depending on your condition, your health care provider may refer you to a specialist for further evaluation. How is this treated?  Stress management techniques are the recommended treatment for stress. Medicine  is not typically recommended for the treatment of stress. Techniques to reduce your reaction to stressful life events include:  Stress identification. Monitor yourself for symptoms of stress and identify what causes stress for you. These skills may help you to avoid or prepare for stressful events.  Time management. Set your priorities, keep a calendar of events, and learn to say no. Taking these actions can help you avoid making too many commitments. Techniques for coping with stress include:  Rethinking the problem. Try to think realistically about stressful events rather than ignoring them or overreacting. Try to find the positives in a stressful situation rather than focusing on the negatives.  Exercise. Physical exercise can release both physical and emotional tension. The key is to find a form of exercise that you enjoy and do it regularly.  Relaxation techniques. These relax the body and mind. The key is to find one or more that you enjoy and use the techniques regularly. Examples include: ? Meditation, deep breathing, or progressive relaxation techniques. ? Yoga or tai chi. ? Biofeedback, mindfulness techniques, or journaling. ? Listening to music, being out in nature, or participating in other hobbies.  Practicing a healthy lifestyle. Eat a balanced diet, drink plenty of water, limit or avoid caffeine, and get plenty of sleep.  Having a strong support network. Spend time with family,  friends, or other people you enjoy being around. Express your feelings and talk things over with someone you trust. Counseling or talk therapy with a mental health professional may be helpful if you are having trouble managing stress on your own. Follow these instructions at home: Lifestyle   Avoid drugs.  Do not use any products that contain nicotine or tobacco, such as cigarettes, e-cigarettes, and chewing tobacco. If you need help quitting, ask your health care provider.  Limit alcohol intake to no more than 1 drink a day for nonpregnant women and 2 drinks a day for men. One drink equals 12 oz of beer, 5 oz of wine, or 1 oz of hard liquor  Do not use alcohol or drugs to relax.  Eat a balanced diet that includes fresh fruits and vegetables, whole grains, lean meats, fish, eggs, and beans, and low-fat dairy. Avoid processed foods and foods high in added fat, sugar, and salt.  Exercise at least 30 minutes on 5 or more days each week.  Get 7-8 hours of sleep each night. General instructions   Practice stress management techniques as discussed with your health care provider.  Drink enough fluid to keep your urine clear or pale yellow.  Take over-the-counter and prescription medicines only as told by your health care provider.  Keep all follow-up visits as told by your health care provider. This is important. Contact a health care provider if:  Your symptoms get worse.  You have new symptoms.  You feel overwhelmed by your problems and can no longer manage them on your own. Get help right away if:  You have thoughts of hurting yourself or others. If you ever feel like you may hurt yourself or others, or have thoughts about taking your own life, get help right away. You can go to your nearest emergency department or call:  Your local emergency services (911 in the [place]  A suicide crisis helpline, such as the [facility] at (989)156-0135. This is open  24 hours a day. Summary  Stress is a normal reaction to life events. It can cause problems if it happens too often or for  too long.  Practicing stress management techniques is the best way to treat stress.  Counseling or talk therapy with a mental health professional may be helpful if you are having trouble managing stress on your own. This information is not intended to replace advice given to you by your health care provider. Make sure you discuss any questions you have with your health care provider. Document Revised: 04/09/2019 Document Reviewed: 10/30/2016 Elsevier Patient Education  [place].

## 2020-06-07 NOTE — Progress Notes (Signed)
Subjective:    Patient ID: Jeanne [name], female    DOB: [birth year], 64 y.o.   MRN: [id number]  No chief complaint on file.   HPI Patient was seen today for f/u.  Pt states she is doing ok.  Pt notes overeating.  [facility] been [facility]  Pt notes fsbs has been "up and down".  Taking Januvia, Metformin 1000 mg BID.  FSBS has been 131, 147.  Pt notes a bs of 110 prior to bed and 181 that am.  Pt states bp has been good.  Taking synthroid for hypothyroidism.  Denies constipation, diarrhea, palpitations, heat or cold intolerance.  Pt notes some anxiety, and decreased energy.   Past Medical History:  Diagnosis Date  . Anemia   . Arthritis   . Diabetes mellitus   . Hypercholesteremia 2011?  [place] [facility] Hypertension   . NSTEMI (non-ST elevated myocardial infarction) [facility]) December 2013   negative cath, normal LV function - felt to be vasospasm  . Thyroid disease     Allergies  Allergen Reactions  . Eggs Or Egg-Derived Products Other (See Comments)    Unknown reaction-patient states she was allergic to egg yolks when she was little. She can eat eggs now, but does not take flu shot or other egg-containing medications.  . Penicillins Other (See Comments)    Unknown-reaction as a child   . Pollen Extract     ROS General: Denies fever, chills, night sweats, changes in weight, changes in appetite HEENT: Denies headaches, ear pain, changes in vision, rhinorrhea, sore throat CV: Denies CP, palpitations, SOB, orthopnea Pulm: Denies SOB, cough, wheezing GI: Denies abdominal pain, nausea, vomiting, diarrhea, constipation GU: Denies dysuria, hematuria, frequency, vaginal discharge Msk: Denies muscle cramps, joint pains Neuro: Denies weakness, numbness, tingling Skin: Denies rashes, bruising Psych: Denies depression, anxiety, hallucinations    Objective:    Blood pressure 116/70, pulse 90, temperature 98.7 F (37.1 C), temperature source Oral, weight 183  lb (83 kg), SpO2 98 %.  Gen. Pleasant, well-nourished, in no distress, normal affect   HEENT: [place]/AT, face symmetric, conjunctiva clear, no scleral icterus, PERRLA, EOMI, nares patent without drainage Lungs: no accessory muscle use, CTAB, no wheezes or rales Cardiovascular: RRR, no m/r/g, no peripheral edema Musculoskeletal: No deformities, no cyanosis or clubbing, normal tone Neuro:  A&Ox3, CN II-XII intact, normal gait Skin:  Warm, no lesions/ rash  Wt Readings from Last 3 Encounters:  04/12/20 183 lb (83 kg)  03/08/20 182 lb (82.6 kg)  12/01/19 180 lb (81.6 kg)    Lab Results  Component Value Date   WBC 4.8 06/02/2019   HGB 12.6 06/02/2019   HCT 37.9 06/02/2019   PLT 307.0 06/02/2019   GLUCOSE 107 (H) 12/01/2019   CHOL 146 06/02/2019   TRIG 60.0 06/02/2019   HDL 45.40 06/02/2019   LDLCALC 89 06/02/2019   ALT 16 08/02/2011   AST 16 08/02/2011   NA 140 12/01/2019   K 4.0 12/01/2019   CL 101 12/01/2019   CREATININE 0.78 12/01/2019   BUN 16 12/01/2019   CO2 30 12/01/2019   TSH 0.97 12/01/2019   INR 1.06 09/22/2012   HGBA1C 6.6 (A) 03/08/2020   MICROALBUR 1.1 12/01/2019    Assessment/Plan:  Type 2 diabetes mellitus with hyperlipidemia (HCC)  -hgb A1C 6.6% -consider food diary -encouraged to start exercising again -continue Januvia 100 mg, metformin 1000 mg BID - Plan: Lipid panel  Postoperative hypothyroidism  -PHQ 9 score 8 -  GAD 7 score 5 -continue synthroid 75 mcg -will adjust dose if needed. - Plan: TSH  Essential hypertension -controlled  -continue lifestyle modifications -continue norvasc 5 mg, valsartan-HCTZ 160-25 mg, coreg 3.125 - Plan: CMP with eGFR([facility]), CMP with eGFR([facility])  Mild depression, single episode -PHQ 9 score 8 -GAD 7 score 5 -consider counseling -self care encouraged -will continue to monitor  F/u in the next month  Grier Mitts, MD

## 2020-06-08 DIAGNOSIS — M4722 Other spondylosis with radiculopathy, cervical region: Secondary | ICD-10-CM | POA: Diagnosis not present

## 2020-06-08 DIAGNOSIS — M9901 Segmental and somatic dysfunction of cervical region: Secondary | ICD-10-CM | POA: Diagnosis not present

## 2020-06-08 DIAGNOSIS — M5032 Other cervical disc degeneration, mid-cervical region, unspecified level: Secondary | ICD-10-CM | POA: Diagnosis not present

## 2020-06-08 DIAGNOSIS — M9907 Segmental and somatic dysfunction of upper extremity: Secondary | ICD-10-CM | POA: Diagnosis not present

## 2020-06-08 LAB — COMPLETE METABOLIC PANEL WITH GFR
AG Ratio: 1.8 (calc) (ref 1.0–2.5)
ALT: 21 U/L (ref 6–29)
AST: 12 U/L (ref 10–35)
Albumin: 4.8 g/dL (ref 3.6–5.1)
Alkaline phosphatase (APISO): 55 U/L (ref 37–153)
BUN: 14 mg/dL (ref 7–25)
CO2: 29 mmol/L (ref 20–32)
Calcium: 10.8 mg/dL — ABNORMAL HIGH (ref 8.6–10.4)
Chloride: 100 mmol/L (ref 98–110)
Creat: 0.77 mg/dL (ref 0.50–0.99)
GFR, Est African American: 95 mL/min/{1.73_m2} (ref >=60)
GFR, Est Non African American: 82 mL/min/{1.73_m2} (ref >=60)
Globulin: 2.6 g/dL (calc) (ref 1.9–3.7)
Glucose, Bld: 126 mg/dL — ABNORMAL HIGH (ref 65–99)
Potassium: 3.8 mmol/L (ref 3.5–5.3)
Sodium: 141 mmol/L (ref 135–146)
Total Bilirubin: 0.5 mg/dL (ref 0.2–1.2)
Total Protein: 7.4 g/dL (ref 6.1–8.1)

## 2020-06-08 LAB — TSH: TSH: 0.63 mIU/L (ref 0.40–4.50)

## 2020-06-08 LAB — LIPID PANEL
Cholesterol: 161 mg/dL (ref <200)
HDL: 55 mg/dL (ref >=50)
LDL Cholesterol (Calc): 90 mg/dL (calc)
Non-HDL Cholesterol (Calc): 106 mg/dL (calc) (ref <130)
Total CHOL/HDL Ratio: 2.9 (calc) (ref <5.0)
Triglycerides: 72 mg/dL (ref <150)

## 2020-06-12 DIAGNOSIS — M4722 Other spondylosis with radiculopathy, cervical region: Secondary | ICD-10-CM | POA: Diagnosis not present

## 2020-06-12 DIAGNOSIS — M9907 Segmental and somatic dysfunction of upper extremity: Secondary | ICD-10-CM | POA: Diagnosis not present

## 2020-06-12 DIAGNOSIS — M5032 Other cervical disc degeneration, mid-cervical region, unspecified level: Secondary | ICD-10-CM | POA: Diagnosis not present

## 2020-06-12 DIAGNOSIS — M9901 Segmental and somatic dysfunction of cervical region: Secondary | ICD-10-CM | POA: Diagnosis not present

## 2020-06-15 DIAGNOSIS — M9907 Segmental and somatic dysfunction of upper extremity: Secondary | ICD-10-CM | POA: Diagnosis not present

## 2020-06-15 DIAGNOSIS — M4722 Other spondylosis with radiculopathy, cervical region: Secondary | ICD-10-CM | POA: Diagnosis not present

## 2020-06-15 DIAGNOSIS — M9901 Segmental and somatic dysfunction of cervical region: Secondary | ICD-10-CM | POA: Diagnosis not present

## 2020-06-15 DIAGNOSIS — M5032 Other cervical disc degeneration, mid-cervical region, unspecified level: Secondary | ICD-10-CM | POA: Diagnosis not present

## 2020-06-19 DIAGNOSIS — M9901 Segmental and somatic dysfunction of cervical region: Secondary | ICD-10-CM | POA: Diagnosis not present

## 2020-06-19 DIAGNOSIS — M5032 Other cervical disc degeneration, mid-cervical region, unspecified level: Secondary | ICD-10-CM | POA: Diagnosis not present

## 2020-06-19 DIAGNOSIS — M4722 Other spondylosis with radiculopathy, cervical region: Secondary | ICD-10-CM | POA: Diagnosis not present

## 2020-06-19 DIAGNOSIS — M9907 Segmental and somatic dysfunction of upper extremity: Secondary | ICD-10-CM | POA: Diagnosis not present

## 2020-06-20 ENCOUNTER — Encounter: Payer: Self-pay | Admitting: Family Medicine

## 2020-06-22 DIAGNOSIS — M4722 Other spondylosis with radiculopathy, cervical region: Secondary | ICD-10-CM | POA: Diagnosis not present

## 2020-06-22 DIAGNOSIS — M9901 Segmental and somatic dysfunction of cervical region: Secondary | ICD-10-CM | POA: Diagnosis not present

## 2020-06-22 DIAGNOSIS — M9907 Segmental and somatic dysfunction of upper extremity: Secondary | ICD-10-CM | POA: Diagnosis not present

## 2020-06-22 DIAGNOSIS — M5032 Other cervical disc degeneration, mid-cervical region, unspecified level: Secondary | ICD-10-CM | POA: Diagnosis not present

## 2020-06-27 ENCOUNTER — Other Ambulatory Visit: Payer: Self-pay | Admitting: Cardiovascular Disease

## 2020-07-03 ENCOUNTER — Other Ambulatory Visit: Payer: Self-pay

## 2020-07-03 ENCOUNTER — Telehealth: Payer: Self-pay | Admitting: Family Medicine

## 2020-07-03 MED ORDER — METFORMIN HCL 500 MG PO TABS: ORAL_TABLET | ORAL | 2 refills | Status: DC

## 2020-07-03 MED ORDER — LEVOTHYROXINE SODIUM 75 MCG PO TABS: ORAL_TABLET | ORAL | 2 refills | Status: DC

## 2020-07-03 MED ORDER — SITAGLIPTIN PHOSPHATE 100 MG PO TABS
100.0000 mg | ORAL_TABLET | Freq: Every day | ORAL | 3 refills | Status: DC
Start: 2020-07-03 — End: 2021-06-27

## 2020-07-03 MED ORDER — ACCU-CHEK FASTCLIX LANCETS MISC: 5 refills | Status: DC

## 2020-07-03 MED ORDER — PRAVASTATIN SODIUM 10 MG PO TABS
10.0000 mg | ORAL_TABLET | Freq: Every day | ORAL | 3 refills | Status: DC
Start: 2020-07-03 — End: 2021-06-27

## 2020-07-03 MED ORDER — VALSARTAN-HYDROCHLOROTHIAZIDE 160-25 MG PO TABS: 1.0000 | ORAL_TABLET | Freq: Every day | ORAL | 3 refills | Status: DC

## 2020-07-03 NOTE — Telephone Encounter (Signed)
Pt is calling in stating that she is close to being out and need the following Rx's JANUVIA 100 MG, levothyroxine (SYNTHROID) 75 MG, metformin (GLUCOPHAGE) 500 MG, pravastatin (PRAVACHOL) 10 MG, Accu-chek glide lancets and valsartan-hydrochlorothiazide (DIOVAN-HCT) 160-25 MG.   Pharm:  [facility]  [phone] (F).

## 2020-07-03 NOTE — Telephone Encounter (Signed)
Pt Rx sent to [facility] pharmacy per pt request

## 2020-07-05 ENCOUNTER — Other Ambulatory Visit: Payer: Self-pay | Admitting: Cardiovascular Disease

## 2020-07-05 NOTE — Telephone Encounter (Signed)
Pt is aware.  

## 2020-07-23 ENCOUNTER — Other Ambulatory Visit: Payer: Self-pay | Admitting: Cardiovascular Disease

## 2020-08-01 DIAGNOSIS — M9901 Segmental and somatic dysfunction of cervical region: Secondary | ICD-10-CM | POA: Diagnosis not present

## 2020-08-01 DIAGNOSIS — M9907 Segmental and somatic dysfunction of upper extremity: Secondary | ICD-10-CM | POA: Diagnosis not present

## 2020-08-01 DIAGNOSIS — M4722 Other spondylosis with radiculopathy, cervical region: Secondary | ICD-10-CM | POA: Diagnosis not present

## 2020-08-01 DIAGNOSIS — M5032 Other cervical disc degeneration, mid-cervical region, unspecified level: Secondary | ICD-10-CM | POA: Diagnosis not present

## 2020-08-03 ENCOUNTER — Other Ambulatory Visit: Payer: Self-pay | Admitting: Cardiovascular Disease

## 2020-08-08 DIAGNOSIS — Z13820 Encounter for screening for osteoporosis: Secondary | ICD-10-CM | POA: Diagnosis not present

## 2020-08-08 DIAGNOSIS — M810 Age-related osteoporosis without current pathological fracture: Secondary | ICD-10-CM | POA: Diagnosis not present

## 2020-08-08 DIAGNOSIS — Z1231 Encounter for screening mammogram for malignant neoplasm of breast: Secondary | ICD-10-CM | POA: Diagnosis not present

## 2020-08-08 DIAGNOSIS — Z01419 Encounter for gynecological examination (general) (routine) without abnormal findings: Secondary | ICD-10-CM | POA: Diagnosis not present

## 2020-08-08 DIAGNOSIS — Z6830 Body mass index (BMI) 30.0-30.9, adult: Secondary | ICD-10-CM | POA: Diagnosis not present

## 2020-08-11 ENCOUNTER — Other Ambulatory Visit: Payer: Self-pay | Admitting: Cardiovascular Disease

## 2020-08-16 ENCOUNTER — Other Ambulatory Visit: Payer: Self-pay | Admitting: Cardiovascular Disease

## 2020-08-16 ENCOUNTER — Other Ambulatory Visit: Payer: Self-pay | Admitting: Family Medicine

## 2020-08-16 NOTE — Telephone Encounter (Signed)
This Rx was sent to [facility]

## 2020-08-21 NOTE — Progress Notes (Signed)
Cardiology Office Note    Date:  08/23/2020   ID:  Jeanne [name], DOB 06-13-1956, MRN [id number]  PCP:  Billie Ruddy, MD  Cardiologist: Lauree Chandler, MD EPS: None  Chief Complaint  Patient presents with  . Follow-up    History of Present Illness:  Jeanne [name] is a 64 y.o. female with history of hypertension, HLD, DM, possible coronary vasospasm.  NSTEMI 08/2012 with cardiac cath in 2013 normal coronary arteries but was felt her chest pain and elevated troponins could be coronary vasospasm versus myocarditis.  LV function was normal.  Patient last saw Dr. Angelena Form 05/20/2019.  Patient comes in for f/u. Denies chest pain, dyspnea, palpitations, edema. Was doing water aerobics at the [facility] but hasn't been doing with [phone]. Can't walk the neighborhood with dogs around. Has been off her diet but trying to get back on track.    Past Medical History:  Diagnosis Date  . Anemia   . Arthritis   . Diabetes mellitus   . Hypercholesteremia 2011?  Marland Kitchen Hypertension   . NSTEMI (non-ST elevated myocardial infarction) [facility]) December 2013   negative cath, normal LV function - felt to be vasospasm  . Thyroid disease     Past Surgical History:  Procedure Laterality Date  . CARDIAC CATHETERIZATION  December 2013   normal coronaries and normal LV function  . FOOT SURGERY    . LEFT HEART CATHETERIZATION WITH CORONARY ANGIOGRAM N/A 09/22/2012   Procedure: LEFT HEART CATHETERIZATION WITH CORONARY ANGIOGRAM;  Surgeon: Sherren Mocha, MD;  Location: [facility] CATH LAB;  Service: Cardiovascular;  Laterality: N/A;  . THYROID SURGERY     Partial removed per pt    Current Medications: Current Meds  Medication Sig  . Accu-Chek FastClix Lancets MISC TEST BLOOD SUGAR DAILY  . acetaminophen (TYLENOL) 325 MG tablet Take 325-650 mg by mouth as needed.  Marland Kitchen amLODipine (NORVASC) 5 MG tablet Take 1 tablet (5 mg total) by mouth daily.  Marland Kitchen aspirin 81 MG tablet Take 81 mg by mouth daily.   . blood glucose meter kit and supplies KIT Dispense based on patient and insurance preference. Use up to four times daily as directed. (FOR ICD-9 250.00, 250.01).  . CALCIUM PO Take 1 tablet by mouth daily. Chewable calcium  . carvedilol (COREG) 3.125 MG tablet Take 1 tablet (3.125 mg total) by mouth 2 (two) times daily with a meal.  . levothyroxine (SYNTHROID) 75 MCG tablet TAKE 1 TABLET BY MOUTH EVERY MORNING ON AN EMPTY STOMACH  . metFORMIN (GLUCOPHAGE) 500 MG tablet Take 2 (1000 mg) tablets twice daily  . nitroGLYCERIN (NITROSTAT) 0.4 MG SL tablet Place 1 tablet (0.4 mg total) under the tongue every 5 (five) minutes x 3 doses as needed for chest pain.  . ONE TOUCH ULTRA TEST test strip   . pantoprazole (PROTONIX) 40 MG tablet Take 1 tablet (40 mg total) by mouth daily as needed (heart burn/indigestion).  . pravastatin (PRAVACHOL) 10 MG tablet Take 1 tablet (10 mg total) by mouth daily.  . sitaGLIPtin (JANUVIA) 100 MG tablet Take 1 tablet (100 mg total) by mouth daily.  . valsartan-hydrochlorothiazide (DIOVAN-HCT) 160-25 MG tablet Take 1 tablet by mouth daily.  . [DISCONTINUED] amLODipine (NORVASC) 5 MG tablet Take 1 tablet (5 mg total) by mouth daily. Please keep upcoming appt in December before anymore refills. Thank you Final Attempt  . [DISCONTINUED] carvedilol (COREG) 3.125 MG tablet Take 1 tablet (3.125 mg total) by mouth 2 (two) times daily with a meal.  Please keep upcoming appt in December before anymore refills. Thank you Final Attempt     Allergies:   Eggs or egg-derived products, Penicillins, and Pollen extract   Social History   Socioeconomic History  . Marital status: Single    Spouse name: Not on file  . Number of children: Not on file  . Years of education: Not on file  . Highest education level: Not on file  Occupational History  . Not on file  Tobacco Use  . Smoking status: Former Smoker    Quit date: 11/21/1990    Years since quitting: 29.7  . Smokeless tobacco:  Never Used  Vaping Use  . Vaping Use: Never used  Substance and Sexual Activity  . Alcohol use: No  . Drug use: No  . Sexual activity: Yes  Other Topics Concern  . Not on file  Social History Narrative  . Not on file   Social Determinants of Health   Financial Resource Strain:   . Difficulty of Paying Living Expenses: Not on file  Food Insecurity:   . Worried About Charity fundraiser in the Last Year: Not on file  . Ran Out of Food in the Last Year: Not on file  Transportation Needs:   . Lack of Transportation (Medical): Not on file  . Lack of Transportation (Non-Medical): Not on file  Physical Activity:   . Days of Exercise per Week: Not on file  . Minutes of Exercise per Session: Not on file  Stress:   . Feeling of Stress : Not on file  Social Connections:   . Frequency of Communication with Friends and Family: Not on file  . Frequency of Social Gatherings with Friends and Family: Not on file  . Attends Religious Services: Not on file  . Active Member of Clubs or Organizations: Not on file  . Attends Archivist Meetings: Not on file  . Marital Status: Not on file     Family History:  The patient's family history is not on file.   ROS:   Please see the history of present illness.    ROS All other systems reviewed and are negative.   PHYSICAL EXAM:   VS:  BP 118/68   Pulse 91   Ht $R'5\' 5"'PX$  (1.651 m)   Wt 188 lb 6.4 oz (85.5 kg)   SpO2 97%   BMI 31.35 kg/m   Physical Exam  GEN: Well nourished, well developed, in no acute distress  Neck: no JVD, carotid bruits, or masses Cardiac:RRR; no murmurs, rubs, or gallops  Respiratory:  clear to auscultation bilaterally, normal work of breathing GI: soft, nontender, nondistended, + BS Ext: without cyanosis, clubbing, or edema, Good distal pulses bilaterally Neuro:  Alert and Oriented x 3 Psych: euthymic mood, full affect  Wt Readings from Last 3 Encounters:  08/23/20 188 lb 6.4 oz (85.5 kg)  06/07/20 183 lb  (83 kg)  04/12/20 183 lb (83 kg)      Studies/Labs Reviewed:   EKG:  EKG is  ordered today.  The ekg ordered today demonstrates NSR with RBBB no change  Recent Labs: 06/07/2020: ALT 21; BUN 14; Creat 0.77; Potassium 3.8; Sodium 141; TSH 0.63   Lipid Panel    Component Value Date/Time   CHOL 161 06/07/2020 1049   TRIG 72 06/07/2020 1049   HDL 55 06/07/2020 1049   CHOLHDL 2.9 06/07/2020 1049   VLDL 12.0 06/02/2019 0842   LDLCALC 90 06/07/2020 1049    Additional studies/  records that were reviewed today include:       ASSESSMENT:    1. Coronary vasospasm (HCC)   2. Essential hypertension   3. RBBB   4. Hyperlipidemia, unspecified hyperlipidemia type   5. Controlled type 2 diabetes mellitus without complication, without long-term current use of insulin (HCC)      PLAN:  In order of problems listed above:  Coronary vasospasm status post NSTEMI 2013 cath showing normal coronary arteries question of vasospasm versus myocarditis, normal LVEF on aspirin, amlodipine and carvedilol. No recurrent angina. Recommend 150 min exercise weekly  Essential hypertension BP well controlled  RBBB, chronic  Hyperlipidemia LDL 90 05/2020 on pravachol 10 mg  DM A1C 6.6 02/2020 managed by PCP, on statin    Medication Adjustments/Labs and Tests Ordered: Current medicines are reviewed at length with the patient today.  Concerns regarding medicines are outlined above.  Medication changes, Labs and Tests ordered today are listed in the Patient Instructions below. Patient Instructions  Medication Instructions:  Your physician recommends that you continue on your current medications as directed. Please refer to the Current Medication list given to you today.  *If you need a refill on your cardiac medications before your next appointment, please call your pharmacy*   Lab Work: None If you have labs (blood work) drawn today and your tests are completely normal, you will receive your results  only by: Marland Kitchen MyChart Message (if you have MyChart) OR . A paper copy in the mail If you have any lab test that is abnormal or we need to change your treatment, we will call you to review the results.   Testing/Procedures: None   Follow-Up: At [facility], you and your health needs are our priority.  As part of our continuing mission to provide you with exceptional heart care, we have created designated Provider Care Teams.  These Care Teams include your primary Cardiologist (physician) and Advanced Practice Providers (APPs -  Physician Assistants and Nurse Practitioners) who all work together to provide you with the care you need, when you need it.  We recommend signing up for the patient portal called "MyChart".  Sign up information is provided on this After Visit Summary.  MyChart is used to connect with patients for Virtual Visits (Telemedicine).  Patients are able to view lab/test results, encounter notes, upcoming appointments, etc.  Non-urgent messages can be sent to your provider as well.   To learn more about what you can do with MyChart, go to [email or web address].    Your next appointment:   1 year(s)  The format for your next appointment:   In Person  Provider:   You may see Lauree Chandler, MD or one of the following Advanced Practice Providers on your designated Care Team:    Melina Copa, PA-C  Ermalinda Barrios, PA-C    Other Instructions Your provider recommends that you maintain 150 minutes per week of moderate aerobic activity.      Signed, Ermalinda Barrios, PA-C  08/23/2020 8:14 AM    [facility] [address], [place], [place]  [address] Phone: [phone]; Fax: [phone]

## 2020-08-23 ENCOUNTER — Other Ambulatory Visit: Payer: Self-pay

## 2020-08-23 ENCOUNTER — Ambulatory Visit: Payer: BC Managed Care – PPO | Admitting: Physician Assistant

## 2020-08-23 ENCOUNTER — Encounter: Payer: Self-pay | Admitting: Physician Assistant

## 2020-08-23 VITALS — BP 118/68 | HR 91 | Ht 65.0 in | Wt 188.4 lb

## 2020-08-23 DIAGNOSIS — I201 Angina pectoris with documented spasm: Secondary | ICD-10-CM

## 2020-08-23 DIAGNOSIS — E119 Type 2 diabetes mellitus without complications: Secondary | ICD-10-CM

## 2020-08-23 DIAGNOSIS — I4510 Unspecified right bundle-branch block: Secondary | ICD-10-CM

## 2020-08-23 DIAGNOSIS — E785 Hyperlipidemia, unspecified: Secondary | ICD-10-CM | POA: Diagnosis not present

## 2020-08-23 DIAGNOSIS — I10 Essential (primary) hypertension: Secondary | ICD-10-CM

## 2020-08-23 MED ORDER — CARVEDILOL 3.125 MG PO TABS
3.1250 mg | ORAL_TABLET | Freq: Two times a day (BID) | ORAL | 3 refills | Status: DC
Start: 2020-08-23 — End: 2021-06-27

## 2020-08-23 MED ORDER — AMLODIPINE BESYLATE 5 MG PO TABS
5.0000 mg | ORAL_TABLET | Freq: Every day | ORAL | 3 refills | Status: DC
Start: 2020-08-23 — End: 2021-06-27

## 2020-08-23 NOTE — Patient Instructions (Signed)
Medication Instructions:  Your physician recommends that you continue on your current medications as directed. Please refer to the Current Medication list given to you today.  *If you need a refill on your cardiac medications before your next appointment, please call your pharmacy*   Lab Work: None If you have labs (blood work) drawn today and your tests are completely normal, you will receive your results only by: . MyChart Message (if you have MyChart) OR . A paper copy in the mail If you have any lab test that is abnormal or we need to change your treatment, we will call you to review the results.   Testing/Procedures: None   Follow-Up: At [facility], you and your health needs are our priority.  As part of our continuing mission to provide you with exceptional heart care, we have created designated Provider Care Teams.  These Care Teams include your primary Cardiologist (physician) and Advanced Practice Providers (APPs -  Physician Assistants and Nurse Practitioners) who all work together to provide you with the care you need, when you need it.  We recommend signing up for the patient portal called "MyChart".  Sign up information is provided on this After Visit Summary.  MyChart is used to connect with patients for Virtual Visits (Telemedicine).  Patients are able to view lab/test results, encounter notes, upcoming appointments, etc.  Non-urgent messages can be sent to your provider as well.   To learn more about what you can do with MyChart, go to [email or web address].    Your next appointment:   1 year(s)  The format for your next appointment:   In Person  Provider:   You may see Christopher McAlhany, MD or one of the following Advanced Practice Providers on your designated Care Team:    Dayna Dunn, PA-C  Michele Lenze, PA-C    Other Instructions Your provider recommends that you maintain 150 minutes per week of moderate aerobic activity.   

## 2020-08-29 NOTE — Telephone Encounter (Signed)
Spoke with pt state that she is currently using [employer] for her refills. State not to send any refills to [facility] since she already explained to them that she switched to [facility]

## 2020-09-04 ENCOUNTER — Encounter: Payer: Self-pay | Admitting: Family Medicine

## 2020-09-04 ENCOUNTER — Other Ambulatory Visit: Payer: Self-pay

## 2020-09-04 ENCOUNTER — Ambulatory Visit (INDEPENDENT_AMBULATORY_CARE_PROVIDER_SITE_OTHER): Payer: Medicare HMO | Admitting: Family Medicine

## 2020-09-04 VITALS — BP 118/72 | HR 88 | Temp 98.2°F | Wt 189.0 lb

## 2020-09-04 DIAGNOSIS — I10 Essential (primary) hypertension: Secondary | ICD-10-CM

## 2020-09-04 DIAGNOSIS — R234 Changes in skin texture: Secondary | ICD-10-CM | POA: Diagnosis not present

## 2020-09-04 NOTE — Progress Notes (Signed)
Subjective:    Patient ID: Jeanne [name], female    DOB: [birth year], 64 y.o.   MRN: [id number]  No chief complaint on file.   HPI Patient is a 64 year old female with past medical history significant for anemia, arthritis, DM 2, HLD, HTN and thyroid dysfunction who was seen for acute concern.  Patient endorses hands peeling x1.5 weeks, R >L.  Pt notes spraying lysol with bleach, then wiping it up.  Pt does not use gloves when cleaning or washing dishes.  Pt also using hand sanitizer throughout the day.  Tried various moisturizing creams.  Hands are not pruritic, erythematous, or swollen.  Pt is R handed.  Past Medical History:  Diagnosis Date  . Anemia   . Arthritis   . Diabetes mellitus   . Hypercholesteremia 2011?  Marland Kitchen Hypertension   . NSTEMI (non-ST elevated myocardial infarction) [facility]) December 2013   negative cath, normal LV function - felt to be vasospasm  . Thyroid disease     Allergies  Allergen Reactions  . Eggs Or Egg-Derived Products Other (See Comments)    Unknown reaction-patient states she was allergic to egg yolks when she was little. She can eat eggs now, but does not take flu shot or other egg-containing medications.  . Penicillins Other (See Comments)    Unknown-reaction as a child   . Pollen Extract     ROS General: Denies fever, chills, night sweats, changes in weight, changes in appetite HEENT: Denies headaches, ear pain, changes in vision, rhinorrhea, sore throat CV: Denies CP, palpitations, SOB, orthopnea Pulm: Denies SOB, cough, wheezing GI: Denies abdominal pain, nausea, vomiting, diarrhea, constipation GU: Denies dysuria, hematuria, frequency, vaginal discharge Msk: Denies muscle cramps, joint pains Neuro: Denies weakness, numbness, tingling Skin: Denies rashes, bruising +peeling skin of hands Psych: Denies depression, anxiety, hallucinations    Objective:    Blood pressure 118/72, pulse 88, temperature 98.2 F (36.8 C), temperature source  Oral, weight 189 lb (85.7 kg), SpO2 98 %.  Gen. Pleasant, well-nourished, in no distress, normal affect HEENT: /AT, face symmetric, conjunctiva clear, no scleral icterus, PERRLA, EOMI, nares patent without drainage Lungs: no accessory muscle use Cardiovascular: RRR, no peripheral edema Musculoskeletal: No deformities, no cyanosis or clubbing, normal tone Neuro:  A&Ox3, CN II-XII intact, normal gait Skin:  Warm,dry.  R hand with desquamination of palm and fingers.  L hand with mild desquamination of fingers.  No erythema, fissures/abrasions, drainage bilateral hands.  No other rash or lesions elsewhere.  Wt Readings from Last 3 Encounters:  09/04/20 189 lb (85.7 kg)  08/23/20 188 lb 6.4 oz (85.5 kg)  06/07/20 183 lb (83 kg)    Lab Results  Component Value Date   WBC 4.8 06/02/2019   HGB 12.6 06/02/2019   HCT 37.9 06/02/2019   PLT 307.0 06/02/2019   GLUCOSE 126 (H) 06/07/2020   CHOL 161 06/07/2020   TRIG 72 06/07/2020   HDL 55 06/07/2020   LDLCALC 90 06/07/2020   ALT 21 06/07/2020   AST 12 06/07/2020   NA 141 06/07/2020   K 3.8 06/07/2020   CL 100 06/07/2020   CREATININE 0.77 06/07/2020   BUN 14 06/07/2020   CO2 29 06/07/2020   TSH 0.63 06/07/2020   INR 1.06 09/22/2012   HGBA1C 6.6 (A) 03/08/2020   MICROALBUR 1.1 12/01/2019    Assessment/Plan:  Localized skin desquamation -Likely 2/2 chemical irritant. -Discussed wearing gloves when cleaning or washing dishes. -Advised to use gentle soaps, lotions, detergents. -Advised hand sanitizer likely  to make hands dry -Can also use OTC cortisone cream as needed x1 week -Continue to monitor  Essential hypertension -Controlled -Continue current medications including Norvasc 5 mg, valsartan-HCTZ 160-25 mg and Coreg 3.125 mg twice daily -Continue lifestyle modifications -Continue follow-up with cardiology  F/u as needed  Abbe Amsterdam, MD

## 2020-11-21 DIAGNOSIS — E119 Type 2 diabetes mellitus without complications: Secondary | ICD-10-CM | POA: Diagnosis not present

## 2020-11-21 LAB — HM DIABETES EYE EXAM

## 2021-01-10 DIAGNOSIS — Z20822 Contact with and (suspected) exposure to covid-19: Secondary | ICD-10-CM | POA: Diagnosis not present

## 2021-01-12 DIAGNOSIS — Z03818 Encounter for observation for suspected exposure to other biological agents ruled out: Secondary | ICD-10-CM | POA: Diagnosis not present

## 2021-01-22 DIAGNOSIS — H401131 Primary open-angle glaucoma, bilateral, mild stage: Secondary | ICD-10-CM | POA: Diagnosis not present

## 2021-03-06 DIAGNOSIS — H2513 Age-related nuclear cataract, bilateral: Secondary | ICD-10-CM | POA: Diagnosis not present

## 2021-03-06 DIAGNOSIS — E119 Type 2 diabetes mellitus without complications: Secondary | ICD-10-CM | POA: Diagnosis not present

## 2021-03-06 DIAGNOSIS — H43813 Vitreous degeneration, bilateral: Secondary | ICD-10-CM | POA: Diagnosis not present

## 2021-03-06 DIAGNOSIS — H401131 Primary open-angle glaucoma, bilateral, mild stage: Secondary | ICD-10-CM | POA: Diagnosis not present

## 2021-03-13 DIAGNOSIS — H401131 Primary open-angle glaucoma, bilateral, mild stage: Secondary | ICD-10-CM | POA: Diagnosis not present

## 2021-03-27 DIAGNOSIS — H401131 Primary open-angle glaucoma, bilateral, mild stage: Secondary | ICD-10-CM | POA: Diagnosis not present

## 2021-05-02 ENCOUNTER — Encounter: Payer: Self-pay | Admitting: Family Medicine

## 2021-05-03 ENCOUNTER — Encounter: Payer: Self-pay | Admitting: Family Medicine

## 2021-05-03 ENCOUNTER — Telehealth (INDEPENDENT_AMBULATORY_CARE_PROVIDER_SITE_OTHER): Payer: Medicare HMO | Admitting: Family Medicine

## 2021-05-03 DIAGNOSIS — U071 COVID-19: Secondary | ICD-10-CM

## 2021-05-03 DIAGNOSIS — Z20822 Contact with and (suspected) exposure to covid-19: Secondary | ICD-10-CM | POA: Diagnosis not present

## 2021-05-03 MED ORDER — BENZONATATE 100 MG PO CAPS: 100.0000 mg | ORAL_CAPSULE | Freq: Three times a day (TID) | ORAL | 0 refills | Status: DC | PRN

## 2021-05-03 MED ORDER — MOLNUPIRAVIR EUA 200MG CAPSULE: 4.0000 | ORAL_CAPSULE | Freq: Two times a day (BID) | ORAL | 0 refills | Status: AC

## 2021-05-03 NOTE — Progress Notes (Signed)
Virtual Visit via Telephone Note  I connected with Jeanne [name] on 05/03/21 at  5:00 PM EDT by telephone and verified that I am speaking with the correct person using two identifiers.   I discussed the limitations, risks, security and privacy concerns of performing an evaluation and management service by telephone and the availability of in person appointments. I also discussed with the patient that there may be a patient responsible charge related to this service. The patient expressed understanding and agreed to proceed.  Location patient: home, [place] Location provider: work or home office Participants present for the call: patient, provider Patient did not have a visit with me in the prior 7 days to address this/these issue(s).   History of Present Illness:  Acute telemedicine visit for Covid19: -Onset: 3 days ago; positive home test last night -Symptoms include: sore throat, cough, nasal congestion, sneezing -Denies:fever, CP, SOB, NVD, inability to eat/drink/get out of bed -Has tried:tylenol, robitussin -Pertinent past medical history:see below -Pertinent medication allergies:  Allergies  Allergen Reactions   Eggs Or Egg-Derived Products Other (See Comments)    Unknown reaction-patient states she was allergic to egg yolks when she was little. She can eat eggs now, but does not take flu shot or other egg-containing medications.   Penicillins Other (See Comments)    Unknown-reaction as a child    Pollen Extract   -COVID-19 vaccine status: vaccinated x 2 and had 1 booster -no recent labs   Past Medical History:  Diagnosis Date   Anemia    Arthritis    Diabetes mellitus    Hypercholesteremia 2011?   Hypertension    NSTEMI (non-ST elevated myocardial infarction) [facility]) December 2013   negative cath, normal LV function - felt to be vasospasm   Thyroid disease       Observations/Objective: Patient sounds cheerful and well on the phone. I do not appreciate any  SOB. Speech and thought processing are grossly intact. Patient reported vitals:  Assessment and Plan:  No diagnosis found.   Discussed treatment options (infusions and oral options and risk of drug interactions), ideal treatment window, potential complications, isolation and precautions for COVID-19.  Discussed possibility of rebound with antivirals and the need to reisolate if it should occur for 5 days. Checked for/reviewed any labs done in the last 90 days with GFR listed in HPI if available.  After lengthy discussion, the patient opted for treatment with molnupiravirdue to being higher risk for complications of covid or severe disease and other factors. Discussed EUA status of this drug and the fact that there is preliminary limited knowledge of risks/interactions/side effects per EUA document vs possible benefits and precautions.  She preferred this over the other antiviral due to her concerns with the potential interactions .This information was shared with patient during the visit and also was provided in patient instructions. Also, advised that patient discuss risks/interactions and use with pharmacist/treatment team as well. The patient did want a prescription for cough, Tessalon Rx sent.  Other symptomatic care measures summarized in patient instructions. Scheduled follow up with PCP offered: She is opted to follow-up as needed with her primary care office, urgent care or emergency room depending on the degree of her concerns. Advised to seek prompt in person care if worsening, new symptoms arise, or if is not improving with treatment. Advised of options for inperson care in case PCP office not available. Did let the patient know that I only do telemedicine shifts for [place] on Tuesdays and Thursdays and  advised a follow up visit with PCP or at an [facility] if has further questions or concerns.   Follow Up Instructions:  I did not refer this patient for an OV with me in the next 24 hours for  this/these issue(s).  I discussed the assessment and treatment plan with the patient. The patient was provided an opportunity to ask questions and all were answered. The patient agreed with the plan and demonstrated an understanding of the instructions.   I spent 18 minutes on the date of this visit in the care of this patient. See summary of tasks completed to properly care for this patient in the detailed notes above which also included counseling of above, review of PMH, medications, allergies, evaluation of the patient and ordering and/or  instructing patient on testing and care options.     Terressa Koyanagi, DO

## 2021-05-03 NOTE — Patient Instructions (Addendum)
HOME CARE TIPS:  -I sent the medication(s) we discussed to your pharmacy: Meds ordered this encounter  Medications   molnupiravir EUA 200 mg CAPS    Sig: Take 4 capsules (800 mg total) by mouth 2 (two) times daily for 5 days.    Dispense:  40 capsule    Refill:  0   benzonatate (TESSALON PERLES) 100 MG capsule    Sig: Take 1 capsule (100 mg total) by mouth 3 (three) times daily as needed.    Dispense:  20 capsule    Refill:  0     -I sent in the [place] treatment or referral you requested per our discussion. Please see the information provided below and discuss further with the pharmacist/treatment team.   -If taking molnupiravir, there is a chance of rebound illness after finishing your treatment. If you become sick again please isolate for an additional 5 days.   -can use tylenol if needed for fevers, aches and pains per instructions  -can use nasal saline a few times per day if you have nasal congestion; sometimes  a short course of Afrin nasal spray for 3 days can help with symptoms as well  -stay hydrated, drink plenty of fluids and eat small healthy meals - avoid dairy  -can take 1000 IU (7mcg) Vit D3 and 100-500 mg of Vit C daily per instructions  -If the Covid test is positive, check out the [facility] website for more information on home care, transmission and treatment for COVID19  -follow up with your doctor in 2-3 days unless improving and feeling better  -stay home while sick, except to seek medical care. If you have COVID19, ideally it would be best to stay home for a full 10 days since the onset of symptoms PLUS one day of no fever and feeling better. Wear a good mask that fits snugly (such as N95 or KN95) if around others to reduce the risk of transmission.  It was nice to meet you today, and I really hope you are feeling better soon. I help [facility] out with telemedicine visits on Tuesdays and Thursdays and am available for visits on those days. If you have any  concerns or questions following this visit please schedule a follow up visit with your Primary Care doctor or seek care at a local urgent care clinic to avoid delays in care.    Seek in person care or schedule a follow up video visit promptly if your symptoms worsen, new concerns arise or you are not improving with treatment. Call 911 and/or seek emergency care if your symptoms are severe or life threatening.    Fact Sheet for Patients And Caregivers Emergency Use Authorization (EUA) Of LAGEVRIOT (molnupiravir) capsules For Coronavirus Disease 2019 (COVID-19)  What is the most important information I should know about LAGEVRIO? LAGEVRIO may cause serious side effects, including: ? LAGEVRIO may cause harm to your unborn baby. It is not known if LAGEVRIO will harm your baby if you take LAGEVRIO during pregnancy. o LAGEVRIO is not recommended for use in pregnancy. o LAGEVRIO has not been studied in pregnancy. LAGEVRIO was studied in pregnant animals only. When LAGEVRIO was given to pregnant animals, LAGEVRIO caused harm to their unborn babies. o You and your healthcare provider may decide that you should take LAGEVRIO during pregnancy if there are no other COVID-19 treatment options approved or authorized by the FDA that are accessible or clinically appropriate for you. o If you and your healthcare provider decide that you should  take LAGEVRIO during pregnancy, you and your healthcare provider should discuss the known and potential benefits and the potential risks of taking LAGEVRIO during pregnancy. For individuals who are able to become pregnant: ? You should use a reliable method of birth control (contraception) consistently and correctly during treatment with LAGEVRIO and for 4 days after the last dose of LAGEVRIO. Talk to your healthcare provider about reliable birth control methods. ? Before starting treatment with [facility] your healthcare provider may do a pregnancy test to see if  you are pregnant before starting treatment with LAGEVRIO. ? Tell your healthcare provider right away if you become pregnant or think you may be pregnant during treatment with LAGEVRIO. Pregnancy Surveillance Program: ? There is a pregnancy surveillance program for individuals who take LAGEVRIO during pregnancy. The purpose of this program is to collect information about the health of you and your baby. Talk to your healthcare provider about how to take part in this program. ? If you take LAGEVRIO during pregnancy and you agree to participate in the pregnancy surveillance program and allow your healthcare provider to share your information with [place], then your healthcare provider will report your use of [place] during pregnancy to [place]. by calling [phone] or PeacefulBlog.es. For individuals who are sexually active with partners who are able to become pregnant: ? It is not known if LAGEVRIO can affect sperm. While the risk is regarded as low, animal studies to fully assess the potential for LAGEVRIO to affect the babies of males treated with LAGEVRIO have not been completed. A reliable method of birth control (contraception) should be used consistently and correctly during treatment with LAGEVRIO and for at least 3 months after the last dose. The risk to sperm beyond 3 months is not known. Studies to understand the risk to sperm beyond 3 months are ongoing. Talk to your healthcare provider about reliable birth control methods. Talk to your healthcare provider if you have questions or concerns about how LAGEVRIO may affect sperm. You are being given this fact sheet because your healthcare provider believes it is necessary to provide you with LAGEVRIO for the treatment of adults with mild-to-moderate coronavirus disease 2019 (COVID-19) with positive results of direct SARS-CoV-2 viral testing, and who are at high risk for progression to severe  COVID-19 including hospitalization or death, and for whom other COVID-19 treatment options approved or authorized by the FDA are not accessible or clinically appropriate. The U.S. Food and Drug Administration (FDA) has issued an Emergency Use Authorization (EUA) to make LAGEVRIO available during the COVID-19 pandemic (for more details about an EUA please see "What is an Emergency Use Authorization?" at the end of this document). LAGEVRIO is not an FDA-approved medicine in the [place]. Read this Fact Sheet for information about LAGEVRIO. Talk to your healthcare provider about your options if you have any questions. It is your choice to take LAGEVRIO.  What is COVID-19? COVID-19 is caused by a virus called a coronavirus. You can get COVID-19 through close contact with another person who has the virus. COVID-19 illnesses have ranged from very mild-to-severe, including illness resulting in death. While information so far suggests that most COVID-19 illness is mild, serious illness can happen and may cause some of your other medical conditions to become worse. Older people and people of all ages with severe, long lasting (chronic) medical conditions like heart disease, lung disease and diabetes, for example seem to be at higher risk of being hospitalized  for COVID-19.  What is LAGEVRIO? LAGEVRIO is an investigational medicine used to treat mild-to-moderate COVID-19 in adults: ? with positive results of direct SARS-CoV-2 viral testing, and ? who are at high risk for progression to severe COVID-19 including hospitalization or death, and for whom other COVID-19 treatment options approved or authorized by the FDA are not accessible or clinically appropriate. The FDA has authorized the emergency use of LAGEVRIO for the treatment of mild-tomoderate COVID-19 in adults under an EUA. For more information on EUA, see the "What is an Emergency Use Authorization (EUA)?" section at the end of this  Fact Sheet. LAGEVRIO is not authorized: ? for use in people less than 34 years of age. ? for prevention of COVID-19. ? for people needing hospitalization for COVID-19. ? for use for longer than 5 consecutive days.  What should I tell my healthcare provider before I take LAGEVRIO? Tell your healthcare provider if you: ? Have any allergies ? Are breastfeeding or plan to breastfeed ? Have any serious illnesses ? Are taking any medicines (prescription, over-the-counter, vitamins, or herbal products).  How do I take LAGEVRIO? ? Take LAGEVRIO exactly as your healthcare provider tells you to take it. ? Take 4 capsules of LAGEVRIO every 12 hours (for example, at 8 am and at 8 pm) ? Take LAGEVRIO for 5 days. It is important that you complete the full 5 days of treatment with LAGEVRIO. Do not stop taking LAGEVRIO before you complete the full 5 days of treatment, even if you feel better. ? Take LAGEVRIO with or without food. ? You should stay in isolation for as long as your healthcare provider tells you to. Talk to your healthcare provider if you are not sure about how to properly isolate while you have COVID-19. ? Swallow LAGEVRIO capsules whole. Do not open, break, or crush the capsules. If you cannot swallow capsules whole, tell your healthcare provider. ? What to do if you miss a dose: o If it has been less than 10 hours since the missed dose, take it as soon as you remember o If it has been more than 10 hours since the missed dose, skip the missed dose and take your dose at the next scheduled time. ? Do not double the dose of LAGEVRIO to make up for a missed dose.  What are the important possible side effects of LAGEVRIO? ? See, "What is the most important information I should know about LAGEVRIO?" ? Allergic Reactions. Allergic reactions can happen in people taking LAGEVRIO, even after only 1 dose. Stop taking LAGEVRIO and call your healthcare provider right away if you get any of  the following symptoms of an allergic reaction: o hives o rapid heartbeat o trouble swallowing or breathing o swelling of the mouth, lips, or face o throat tightness o hoarseness o skin rash The most common side effects of LAGEVRIO are: ? diarrhea ? nausea ? dizziness These are not all the possible side effects of LAGEVRIO. Not many people have taken LAGEVRIO. Serious and unexpected side effects may happen. This medicine is still being studied, so it is possible that all of the risks are not known at this time.  What other treatment choices are there?  Veklury (remdesivir) is FDA-approved as an intravenous (IV) infusion for the treatment of mildto-moderate FGHWE-99 in certain adults and children. Talk with your doctor to see if Marijean Heath is appropriate for you. Like LAGEVRIO, FDA may also allow for the emergency use of other medicines to treat people with  COVID-19. Go to LacrosseProperties.si for more information. It is your choice to be treated or not to be treated with LAGEVRIO. Should you decide not to take it, it will not change your standard medical care.  What if I am breastfeeding? Breastfeeding is not recommended during treatment with LAGEVRIO and for 4 days after the last dose of LAGEVRIO. If you are breastfeeding or plan to breastfeed, talk to your healthcare provider about your options and specific situation before taking LAGEVRIO.  How do I report side effects with LAGEVRIO? Contact your healthcare provider if you have any side effects that bother you or do not go away. Report side effects to FDA MedWatch at SmoothHits.hu or call 1-800-FDA-1088 (1- 330-542-2463).  How should I store [place]? ? Store LAGEVRIO capsules at room temperature between 15F to 70F (20C to 25C). ? Keep LAGEVRIO and all medicines out of the reach of children and pets. How can I learn  more about COVID-19? ? Ask your healthcare provider. ? Visit SeekRooms.co.uk ? Contact your local or state public health department. ? Call [place] at [phone] (toll free in the [place] ? Visit www.molnupiravir.com  What Is an Emergency Use Authorization (EUA)? The [place] FDA has made [place] available under an emergency access mechanism called an Emergency Use Authorization (EUA) The EUA is supported by a Presenter, broadcasting Health and Human Service (HHS) declaration that circumstances exist to justify emergency use of drugs and biological products during the COVID-19 pandemic. LAGEVRIO for the treatment of mild-to-moderate COVID-19 in adults with positive results of direct SARS-CoV-2 viral testing, who are at high risk for progression to severe COVID-19, including hospitalization or death, and for whom alternative COVID-19 treatment options approved or authorized by FDA are not accessible or clinically appropriate, has not undergone the same type of review as an FDA-approved product. In issuing an EUA under the CLEXN-17 public health emergency, the FDA has determined, among other things, that based on the total amount of scientific evidence available including data from adequate and well-controlled clinical trials, if available, it is reasonable to believe that the product may be effective for diagnosing, treating, or preventing COVID-19, or a serious or life-threatening disease or condition caused by COVID-19; that the known and potential benefits of the product, when used to diagnose, treat, or prevent such disease or condition, outweigh the known and potential risks of such product; and that there are no adequate, approved, and available alternatives.  All of these criteria must be met to allow for the product to be used in the treatment of patients during the COVID-19 pandemic. The EUA for LAGEVRIO is in effect for the duration of the COVID-19 declaration  justifying emergency use of LAGEVRIO, unless terminated or revoked (after which LAGEVRIO may no longer be used under the EUA). For patent information: http://rogers.info/ Copyright  2021-2022 [place] [place], [place] and its affiliates. All rights reserved. [id number] Revised: March 2022

## 2021-05-08 ENCOUNTER — Other Ambulatory Visit: Payer: Self-pay | Admitting: Family Medicine

## 2021-05-09 ENCOUNTER — Encounter: Payer: Self-pay | Admitting: Family Medicine

## 2021-05-10 DIAGNOSIS — Z20822 Contact with and (suspected) exposure to covid-19: Secondary | ICD-10-CM | POA: Diagnosis not present

## 2021-05-21 DIAGNOSIS — H401131 Primary open-angle glaucoma, bilateral, mild stage: Secondary | ICD-10-CM | POA: Diagnosis not present

## 2021-06-19 DIAGNOSIS — H401131 Primary open-angle glaucoma, bilateral, mild stage: Secondary | ICD-10-CM | POA: Diagnosis not present

## 2021-06-20 ENCOUNTER — Encounter: Payer: Self-pay | Admitting: Family Medicine

## 2021-06-26 ENCOUNTER — Other Ambulatory Visit: Payer: Self-pay

## 2021-06-27 ENCOUNTER — Ambulatory Visit (INDEPENDENT_AMBULATORY_CARE_PROVIDER_SITE_OTHER): Payer: Medicare HMO | Admitting: Family Medicine

## 2021-06-27 ENCOUNTER — Encounter: Payer: Self-pay | Admitting: Family Medicine

## 2021-06-27 VITALS — BP 128/88 | HR 87 | Temp 98.5°F | Ht 66.0 in | Wt 187.7 lb

## 2021-06-27 DIAGNOSIS — Z23 Encounter for immunization: Secondary | ICD-10-CM

## 2021-06-27 DIAGNOSIS — E782 Mixed hyperlipidemia: Secondary | ICD-10-CM

## 2021-06-27 DIAGNOSIS — E119 Type 2 diabetes mellitus without complications: Secondary | ICD-10-CM | POA: Diagnosis not present

## 2021-06-27 DIAGNOSIS — I10 Essential (primary) hypertension: Secondary | ICD-10-CM | POA: Diagnosis not present

## 2021-06-27 DIAGNOSIS — E890 Postprocedural hypothyroidism: Secondary | ICD-10-CM | POA: Diagnosis not present

## 2021-06-27 DIAGNOSIS — Z780 Asymptomatic menopausal state: Secondary | ICD-10-CM | POA: Diagnosis not present

## 2021-06-27 DIAGNOSIS — Z0000 Encounter for general adult medical examination without abnormal findings: Secondary | ICD-10-CM | POA: Diagnosis not present

## 2021-06-27 LAB — CBC WITH DIFFERENTIAL/PLATELET
Basophils Absolute: 0 10*3/uL (ref 0.0–0.1)
Basophils Relative: 0.4 % (ref 0.0–3.0)
Eosinophils Absolute: 0.1 10*3/uL (ref 0.0–0.7)
Eosinophils Relative: 1.2 % (ref 0.0–5.0)
HCT: 39.5 % (ref 36.0–46.0)
Hemoglobin: 12.9 g/dL (ref 12.0–15.0)
Lymphocytes Relative: 35.4 % (ref 12.0–46.0)
Lymphs Abs: 1.9 10*3/uL (ref 0.7–4.0)
MCHC: 32.6 g/dL (ref 30.0–36.0)
MCV: 88.5 fl (ref 78.0–100.0)
Monocytes Absolute: 0.5 10*3/uL (ref 0.1–1.0)
Monocytes Relative: 8.3 % (ref 3.0–12.0)
Neutro Abs: 3 10*3/uL (ref 1.4–7.7)
Neutrophils Relative %: 54.7 % (ref 43.0–77.0)
Platelets: 307 10*3/uL (ref 150.0–400.0)
RBC: 4.46 Mil/uL (ref 3.87–5.11)
RDW: 15.2 % (ref 11.5–15.5)
WBC: 5.4 10*3/uL (ref 4.0–10.5)

## 2021-06-27 LAB — VITAMIN D 25 HYDROXY (VIT D DEFICIENCY, FRACTURES): VITD: 65.35 ng/mL (ref 30.00–100.00)

## 2021-06-27 LAB — BASIC METABOLIC PANEL
BUN: 15 mg/dL (ref 6–23)
CO2: 28 mEq/L (ref 19–32)
Calcium: 10.6 mg/dL — ABNORMAL HIGH (ref 8.4–10.5)
Chloride: 99 mEq/L (ref 96–112)
Creatinine, Ser: 0.78 mg/dL (ref 0.40–1.20)
GFR: 79.43 mL/min (ref >60.00)
Glucose, Bld: 124 mg/dL — ABNORMAL HIGH (ref 70–99)
Potassium: 3.9 mEq/L (ref 3.5–5.1)
Sodium: 138 mEq/L (ref 135–145)

## 2021-06-27 LAB — LIPID PANEL
Cholesterol: 145 mg/dL (ref 0–200)
HDL: 46.2 mg/dL (ref >39.00)
LDL Cholesterol: 82 mg/dL (ref 0–99)
NonHDL: 98.76
Total CHOL/HDL Ratio: 3
Triglycerides: 85 mg/dL (ref 0.0–149.0)
VLDL: 17 mg/dL (ref 0.0–40.0)

## 2021-06-27 LAB — HEMOGLOBIN A1C: Hgb A1c MFr Bld: 7.5 % — ABNORMAL HIGH (ref 4.6–6.5)

## 2021-06-27 LAB — TSH: TSH: 1.22 u[IU]/mL (ref 0.35–5.50)

## 2021-06-27 MED ORDER — AMLODIPINE BESYLATE 5 MG PO TABS: 5.0000 mg | ORAL_TABLET | Freq: Every day | ORAL | 3 refills | Status: DC

## 2021-06-27 MED ORDER — LEVOTHYROXINE SODIUM 75 MCG PO TABS: ORAL_TABLET | ORAL | 2 refills | Status: DC

## 2021-06-27 MED ORDER — METFORMIN HCL 500 MG PO TABS: ORAL_TABLET | ORAL | 3 refills | Status: DC

## 2021-06-27 MED ORDER — SITAGLIPTIN PHOSPHATE 100 MG PO TABS: 100.0000 mg | ORAL_TABLET | Freq: Every day | ORAL | 3 refills | Status: DC

## 2021-06-27 MED ORDER — VALSARTAN-HYDROCHLOROTHIAZIDE 160-25 MG PO TABS: 1.0000 | ORAL_TABLET | Freq: Every day | ORAL | 3 refills | Status: DC

## 2021-06-27 MED ORDER — CARVEDILOL 3.125 MG PO TABS: 3.1250 mg | ORAL_TABLET | Freq: Two times a day (BID) | ORAL | 3 refills | Status: DC

## 2021-06-27 MED ORDER — PRAVASTATIN SODIUM 10 MG PO TABS: 10.0000 mg | ORAL_TABLET | Freq: Every day | ORAL | 3 refills | Status: DC

## 2021-06-27 NOTE — Progress Notes (Signed)
Subjective:     Jeanne [name] is a 65 y.o. female and is here for a comprehensive physical exam. Seen by ophthalmology.  Notes pressure still little higher than desired.  Notes right eye twitches and eyes are dry more since having a procedure to help reduce the pressure.  Patient follows up with ophthalmology every 3-4 months.  Patient denies changes in vision but unable to read as much as she normally would due to the dry eyes.  Patient planing a trip to [place] next year.  Patient notes blood sugar slightly elevated week.  Eating more fruit and rice.  FSBS typically 140-150s in the morning, was 178 this morning after having ice cream last night.  Taking metformin 1000 mg twice daily and Januvia 100 mg daily.  Social History   Socioeconomic History   Marital status: Single    Spouse name: Not on file   Number of children: Not on file   Years of education: Not on file   Highest education level: Not on file  Occupational History   Not on file  Tobacco Use   Smoking status: Former    Types: Cigarettes    Quit date: 11/21/1990    Years since quitting: 30.6   Smokeless tobacco: Never  Vaping Use   Vaping Use: Never used  Substance and Sexual Activity   Alcohol use: No   Drug use: No   Sexual activity: Yes  Other Topics Concern   Not on file  Social History Narrative   Not on file   Social Determinants of Health   Financial Resource Strain: Not on file  Food Insecurity: Not on file  Transportation Needs: Not on file  Physical Activity: Not on file  Stress: Not on file  Social Connections: Not on file  Intimate Partner Violence: Not on file   Health Maintenance  Topic Date Due   HIV Screening  Never done   Zoster Vaccines- Shingrix (1 of 2) Never done   PAP SMEAR-Modifier  05/24/2019   MAMMOGRAM  07/24/2019   DEXA SCAN  Never done   HEMOGLOBIN A1C  09/07/2020   FOOT EXAM  11/30/2020   COVID-19 Vaccine (4 - Booster for Moderna series) 12/27/2020   INFLUENZA VACCINE   Never done   OPHTHALMOLOGY EXAM  11/21/2021   TETANUS/TDAP  12/06/2023   COLONOSCOPY (Pts 45-56yrs Insurance coverage will need to be confirmed)  04/16/2027   Hepatitis C Screening  Completed   HPV VACCINES  Aged Out    The following portions of the patient's history were reviewed and updated as appropriate: allergies, current medications, past family history, past medical history, past social history, past surgical history, and problem list.  Review of Systems Pertinent items noted in HPI and remainder of comprehensive ROS otherwise negative.   Objective:    BP 128/88   Pulse 87   Temp 98.5 F (36.9 C) (Oral)   Ht 5\' 6"  (1.676 m)   Wt 187 lb 11.2 oz (85.1 kg)   SpO2 99%   BMI 30.30 kg/m  General appearance: alert, cooperative, and no distress Head: Normocephalic, without obvious abnormality, atraumatic Eyes: conjunctivae/corneas clear. PERRL, EOM's intact. Fundi benign. Ears: normal TM's and external ear canals both ears Nose: Nares normal. Septum midline. Mucosa normal. No drainage or sinus tenderness. Throat: lips, mucosa, and tongue normal; teeth and gums normal Neck: no adenopathy, no carotid bruit, no JVD, supple, symmetrical, trachea midline, and thyroid not enlarged, symmetric, no tenderness/mass/nodules Lungs: clear to auscultation bilaterally Heart: regular  rate and rhythm, S1, S2 normal, no murmur, click, rub or gallop Abdomen: soft, non-tender; bowel sounds normal; no masses,  no organomegaly Extremities: extremities normal, atraumatic, no cyanosis or edema Pulses: 2+ and symmetric Skin: Skin color, texture, turgor normal. No rashes or lesions Lymph nodes: Cervical, supraclavicular, and axillary nodes normal. Neurologic: Alert and oriented X 3, normal strength and tone. Normal symmetric reflexes. Normal coordination and gait    Lift in right shoe  Diabetic Foot Exam - Simple   Simple Foot Form Visual Inspection No deformities, no ulcerations, no other skin  breakdown bilaterally: Yes Sensation Testing Intact to touch and monofilament testing bilaterally: Yes Pulse Check Posterior Tibialis and Dorsalis pulse intact bilaterally: Yes Comments Callus on medial right great toe.     Assessment:    Healthy female exam.      Plan:    Anticipatory guidance given including wearing seatbelts, smoke detectors in the home, increasing physical activity, increasing p.o. intake of water and vegetables. -will obtain labs -pt to have Mammogram in Nov with OB/Gyn -followed by OB/Gyn.  S/p hysterectomy -colonoscopy done 08/20/2017 -given handout -next CPE in 1 yr See After Visit Summary for Counseling Recommendations   Essential hypertension  -elevated -recheck 128/88 -lifestyle modifications -continue current meds: Coreg 3.125 mg twice daily, valsartan-hydrochlorothiazide 160-25 mg daily, Norvasc 5 mg daily - Plan: Basic metabolic panel, amLODipine (NORVASC) 5 MG tablet, carvedilol (COREG) 3.125 MG tablet, valsartan-hydrochlorothiazide (DIOVAN-HCT) 160-25 MG tablet  Controlled type 2 diabetes mellitus without complication, without long-term current use of insulin (HCC) -Hgb A1C 6.6% on 03/08/20 -continue lifestyle modifications -diabetic retinopathy exam negative 11/21/20 -foot exam done this visit  - Plan: Hemoglobin A1c, metFORMIN (GLUCOPHAGE) 500 MG tablet, sitaGLIPtin (JANUVIA) 100 MG tablet  Mixed hyperlipidemia  -continue pravastatin -continue lifestyle modification - Plan: Lipid panel, pravastatin (PRAVACHOL) 10 MG tablet  Need for influenza vaccination -Egg free influenza vaccine given this visit due to h/o egg allergy as a child.  Rxn unknown.  Able to eat eggs now. - Plan: Flu Vaccine MDCK QUAD PF  Postoperative hypothyroidism  - Plan: TSH, levothyroxine (SYNTHROID) 75 MCG tablet  Asymptomatic menopause  - Plan: Vitamin D, 25-hydroxy  F/u in 2 months  Abbe Amsterdam, MD

## 2021-06-28 ENCOUNTER — Encounter: Payer: Self-pay | Admitting: Family Medicine

## 2021-07-03 ENCOUNTER — Other Ambulatory Visit: Payer: Self-pay

## 2021-07-03 DIAGNOSIS — E890 Postprocedural hypothyroidism: Secondary | ICD-10-CM

## 2021-07-03 MED ORDER — LEVOTHYROXINE SODIUM 75 MCG PO TABS: ORAL_TABLET | ORAL | 1 refills | Status: DC

## 2021-07-14 ENCOUNTER — Emergency Department (HOSPITAL_BASED_OUTPATIENT_CLINIC_OR_DEPARTMENT_OTHER)
Admission: EM | Admit: 2021-07-14 | Discharge: 2021-07-14 | Disposition: A | Discharge status: Home / Self Care | Payer: Medicare HMO | Attending: Emergency Medicine | Admitting: Emergency Medicine

## 2021-07-14 ENCOUNTER — Emergency Department (HOSPITAL_BASED_OUTPATIENT_CLINIC_OR_DEPARTMENT_OTHER): Payer: Medicare HMO

## 2021-07-14 ENCOUNTER — Encounter (HOSPITAL_BASED_OUTPATIENT_CLINIC_OR_DEPARTMENT_OTHER): Payer: Self-pay | Admitting: Emergency Medicine

## 2021-07-14 ENCOUNTER — Ambulatory Visit: Admission: EM | Admit: 2021-07-14 | Discharge: 2021-07-14 | Discharge status: Absent without leave | Payer: Medicare HMO

## 2021-07-14 ENCOUNTER — Other Ambulatory Visit: Payer: Self-pay

## 2021-07-14 DIAGNOSIS — Y92830 Public park as the place of occurrence of the external cause: Secondary | ICD-10-CM | POA: Insufficient documentation

## 2021-07-14 DIAGNOSIS — E119 Type 2 diabetes mellitus without complications: Secondary | ICD-10-CM | POA: Diagnosis not present

## 2021-07-14 DIAGNOSIS — Y9301 Activity, walking, marching and hiking: Secondary | ICD-10-CM | POA: Insufficient documentation

## 2021-07-14 DIAGNOSIS — Z79899 Other long term (current) drug therapy: Secondary | ICD-10-CM | POA: Diagnosis not present

## 2021-07-14 DIAGNOSIS — Z955 Presence of coronary angioplasty implant and graft: Secondary | ICD-10-CM | POA: Insufficient documentation

## 2021-07-14 DIAGNOSIS — S0990XA Unspecified injury of head, initial encounter: Secondary | ICD-10-CM | POA: Diagnosis not present

## 2021-07-14 DIAGNOSIS — Z87891 Personal history of nicotine dependence: Secondary | ICD-10-CM | POA: Insufficient documentation

## 2021-07-14 DIAGNOSIS — I10 Essential (primary) hypertension: Secondary | ICD-10-CM | POA: Insufficient documentation

## 2021-07-14 DIAGNOSIS — E039 Hypothyroidism, unspecified: Secondary | ICD-10-CM | POA: Diagnosis not present

## 2021-07-14 DIAGNOSIS — M25562 Pain in left knee: Secondary | ICD-10-CM | POA: Diagnosis not present

## 2021-07-14 DIAGNOSIS — Z7982 Long term (current) use of aspirin: Secondary | ICD-10-CM | POA: Diagnosis not present

## 2021-07-14 DIAGNOSIS — W01198A Fall on same level from slipping, tripping and stumbling with subsequent striking against other object, initial encounter: Secondary | ICD-10-CM | POA: Diagnosis not present

## 2021-07-14 DIAGNOSIS — Z7984 Long term (current) use of oral hypoglycemic drugs: Secondary | ICD-10-CM | POA: Insufficient documentation

## 2021-07-14 MED ORDER — ACETAMINOPHEN 325 MG PO TABS
650.0000 mg | ORAL_TABLET | Freq: Once | ORAL | Status: AC
  Administered 2021-07-14: 650 mg via ORAL
  Filled 2021-07-14: qty 2

## 2021-07-14 NOTE — Discharge Instructions (Addendum)
You were seen and evaluated in the emergency department today for further evaluation of head injury.  As we discussed, the scan of your head and knee were negative which is reassuring.  I would like for you to follow-up with your primary care doctor within the next week to ensure we are going the right direction.  Tylenol or ibuprofen for pain.  Please return to the emergency department if you experience worsening pain, trouble speaking, trouble walking, weakness/numbness of any of your extremities, or any other concerns you might have.

## 2021-07-14 NOTE — ED Triage Notes (Signed)
Pt tripped and fell yesterday and hit her head on a tree. Initially had swelling to forehead but it has gone down. C/o ongoing head pain. Does not take blood thinners. Also c/o L upper arm and L knee pain.

## 2021-07-14 NOTE — ED Provider Notes (Signed)
[facility] DEPT Provider Note   CSN: [id number] Arrival date & time: 07/14/21  1212     History Chief Complaint  Patient presents with   Jeanne [name] is a 65 y.o. female who presents to the emergency department after a trip and fall that occurred last night.  Patient states that she was walking in the park when she tripped over an unknown object and fell forward hitting her head against a tree and then off to the left side.  She denies losing any consciousness or any focal weakness/numbness to the upper and lower extremities.  She does complain of swelling and ecchymosis localized to the left forehead and frontal scalp.  Also complains of a mild headache.  She also is complaining of left knee pain after the incident.   Fall      Past Medical History:  Diagnosis Date   Anemia    Arthritis    Diabetes mellitus    Hypercholesteremia 2011?   Hypertension    NSTEMI (non-ST elevated myocardial infarction) [facility]) December 2013   negative cath, normal LV function - felt to be vasospasm   Thyroid disease     Patient Active Problem List   Diagnosis Date Noted   Mixed hyperlipidemia 06/27/2021   Postoperative hypothyroidism 06/27/2021   Controlled type 2 diabetes mellitus without complication, without long-term current use of insulin ([place]) 06/02/2019   Essential hypertension 09/27/2013   RBBB 09/27/2013   NSTEMI (non-ST elevated myocardial infarction) ([place]) 09/21/2012    Past Surgical History:  Procedure Laterality Date   CARDIAC CATHETERIZATION  December 2013   normal coronaries and normal LV function   FOOT SURGERY     LEFT HEART CATHETERIZATION WITH CORONARY ANGIOGRAM N/A 09/22/2012   Procedure: LEFT HEART CATHETERIZATION WITH CORONARY ANGIOGRAM;  Surgeon: Sherren Mocha, MD;  Location: [facility] CATH LAB;  Service: Cardiovascular;  Laterality: N/A;   THYROID SURGERY     Partial removed per pt     OB History     Gravida  5   Para   3   Term      Preterm      AB      Living         SAB      IAB      Ectopic      Multiple      Live Births              No family history on file.  Social History   Tobacco Use   Smoking status: Former    Types: Cigarettes    Quit date: 11/21/1990    Years since quitting: 30.6   Smokeless tobacco: Never  Vaping Use   Vaping Use: Never used  Substance Use Topics   Alcohol use: No   Drug use: No    Home Medications Prior to Admission medications   Medication Sig Start Date End Date Taking? Authorizing Provider  Accu-Chek FastClix Lancets MISC TEST BLOOD SUGAR DAILY 07/03/20   Billie Ruddy, MD  amLODipine (NORVASC) 5 MG tablet Take 1 tablet (5 mg total) by mouth daily. 06/27/21   Billie Ruddy, MD  aspirin 81 MG tablet Take 81 mg by mouth daily.    [provider]  benzonatate (TESSALON PERLES) 100 MG capsule Take 1 capsule (100 mg total) by mouth 3 (three) times daily as needed. 05/03/21   Lucretia Kern, DO  blood glucose meter kit and supplies KIT Dispense  based on patient and insurance preference. Use up to four times daily as directed. (FOR ICD-9 250.00, 250.01). 03/08/20   Billie Ruddy, MD  CALCIUM PO Take 1 tablet by mouth daily. Chewable calcium    [provider]  carvedilol (COREG) 3.125 MG tablet Take 1 tablet (3.125 mg total) by mouth 2 (two) times daily with a meal. 06/27/21   Billie Ruddy, MD  levothyroxine (SYNTHROID) 75 MCG tablet TAKE 1 TABLET BY MOUTH EVERY MORNING ON AN EMPTY STOMACH 07/03/21   Billie Ruddy, MD  metFORMIN (GLUCOPHAGE) 500 MG tablet TAKE 2 TABLETS TWICE DAILY 06/27/21   Billie Ruddy, MD  nitroGLYCERIN (NITROSTAT) 0.4 MG SL tablet Place 1 tablet (0.4 mg total) under the tongue every 5 (five) minutes x 3 doses as needed for chest pain. Patient not taking: Reported on 06/27/2021 11/02/15   Burnell Blanks, MD  ONE TOUCH ULTRA TEST test strip  09/13/14   [provider]  pravastatin  (PRAVACHOL) 10 MG tablet Take 1 tablet (10 mg total) by mouth daily. 06/27/21   Billie Ruddy, MD  sitaGLIPtin (JANUVIA) 100 MG tablet Take 1 tablet (100 mg total) by mouth daily. 06/27/21   Billie Ruddy, MD  valsartan-hydrochlorothiazide (DIOVAN-HCT) 160-25 MG tablet Take 1 tablet by mouth daily. 06/27/21   Billie Ruddy, MD    Allergies    Eggs or egg-derived products, Penicillins, and Pollen extract  Review of Systems   Review of Systems  All other systems reviewed and are negative.  Physical Exam Updated Vital Signs BP 126/63   Pulse 83   Temp 98.7 F (37.1 C)   Resp 18   Ht $R'5\' 6"'bm$  (1.676 m)   Wt 84.8 kg   SpO2 100%   BMI 30.18 kg/m   Physical Exam Vitals and nursing note reviewed.  Constitutional:      Appearance: Normal appearance.  HENT:     Head: Normocephalic. No raccoon eyes.     Comments: Mild swelling to the left forehead and frontal scalp.  No obvious ecchymosis at this time. Eyes:     General:        Right eye: No discharge.        Left eye: No discharge.     Conjunctiva/sclera: Conjunctivae normal.     Comments: Extraocular movements are intact.  Pupils are equal round and reactive to light.  Pulmonary:     Effort: Pulmonary effort is normal.  Musculoskeletal:     Comments: Left knee is tender to palpation primarily over the patella.  She has good range of motion and strength against resistance.  Skin:    General: Skin is warm and dry.     Findings: No rash.  Neurological:     General: No focal deficit present.     Mental Status: She is alert.     Comments: Cranial nerves II through XII are intact.  5/5 strength the upper and lower extremities.  Normal sensation to the upper and lower extremities.  Gait is normal.  Psychiatric:        Mood and Affect: Mood normal.        Behavior: Behavior normal.    ED Results / Procedures / Treatments   Labs (all labs ordered are listed, but only abnormal results are displayed) Labs Reviewed - No data to  display  EKG None  Radiology CT Head Wo Contrast  Result Date: 07/14/2021 CLINICAL DATA:  Head trauma. Moderate-severe head injury. Fell yesterday. EXAM:  CT HEAD WITHOUT CONTRAST TECHNIQUE: Contiguous axial images were obtained from the base of the skull through the vertex without intravenous contrast. COMPARISON:  None. FINDINGS: Brain: Normal appearance of the brain without evidence of old or acute infarction, mass lesion, hemorrhage, hydrocephalus or extra-axial collection. Prominent dural calcification is a normal variant. Vascular: No abnormal vascular finding. Skull: No skull fracture. Sinuses/Orbits: Sinuses are clear.  Orbits negative. Other: Soft tissue swelling of the frontal scalp left more than right. IMPRESSION: No intracranial abnormality. Dural calcification which is a normal variant. No skull fracture. Frontal scalp swelling, left worse than right. Electronically Signed   By: Nelson Chimes M.D.   On: 07/14/2021 14:52   DG Knee Complete 4 Views Left  Result Date: 07/14/2021 CLINICAL DATA:  Pain post fall EXAM: LEFT KNEE - COMPLETE 4+ VIEW COMPARISON:  None. FINDINGS: No evidence of fracture, dislocation, or joint effusion. Traction spurs from the patella. Small marginal spurs about the lateral compartment. No other focal bone abnormality. Soft tissues are unremarkable. IMPRESSION: 1. No fracture or other acute finding. 2. Early lateral compartment degenerative spurring. Electronically Signed   By: Lucrezia Europe M.D.   On: 07/14/2021 15:04    Procedures Procedures   Medications Ordered in ED Medications  acetaminophen (TYLENOL) tablet 650 mg (650 mg Oral Given 07/14/21 1340)    ED Course  I have reviewed the triage vital signs and the nursing notes.  Pertinent labs & imaging results that were available during my care of the patient were reviewed by me and considered in my medical decision making (see chart for details).  Clinical Course as of 07/14/21 1524  Sat Jul 14, 2021   1524 I discussed this case with my attending physician who cosigned this note including patient's presenting symptoms, physical exam, and planned diagnostics and interventions. Attending physician stated agreement with plan or made changes to plan which were implemented.     [CF]    Clinical Course User Index [CF] Cherrie Gauze   MDM Rules/Calculators/A&P                          Jeanne [name] is a 65 y.o. female who presents to the emergency department for further evaluation of head injury after a trip and fall.  Patient is alert and oriented x4.  She did mentating well during her stay here.  Her neurological exam is reassuring for any intracranial pathology acutely right now.  I have a low suspicion for intracranial hemorrhage.  Imaging of the head was negative.  Imaging of the left knee was negative.  We will have her use Tylenol for pain and will have her follow-up with her primary care provider for further evaluation.  Patient is amenable to this plan.  All questions or concerns addressed.  She is safe for discharge.    Final Clinical Impression(s) / ED Diagnoses Final diagnoses:  Injury of head, initial encounter  Acute pain of left knee    Rx / DC Orders ED Discharge Orders     None        Cherrie Gauze 07/14/21 1524    Luna Fuse, MD 07/15/21 [phone]

## 2021-07-14 NOTE — ED Provider Notes (Signed)
Emergency Medicine Provider Triage Evaluation Note  Jeanne [name] , a 65 y.o. female  was evaluated in triage.  Pt complains of a head injury after trip and fall.  Patient tripped over an unknown object fell forward and struck her head against a tree and then fell down onto the left side.  Did not lose consciousness.  Has had headache since then.  No focal weakness or numbness.  Not on any anticoagulation.  Review of Systems  Positive:  Negative: See above  Physical Exam  BP (!) 161/72 (BP Location: Right Arm)   Pulse 91   Temp 98.7 F (37.1 C)   Resp 18   Ht 5\' 6"  (1.676 m)   Wt 84.8 kg   SpO2 99%   BMI 30.18 kg/m  Gen:   Awake, no distress   Resp:  Normal effort  MSK:   Moves extremities without difficulty  Other:  Cranial nerves II through XII intact.  5/5 strength in the upper lower extremities.  Normal sensation in the upper and lower extremities.  Medical Decision Making  Medically screening exam initiated at 1:38 PM.  Appropriate orders placed.  Lurie Mullane was informed that the remainder of the evaluation will be completed by another provider, this initial triage assessment does not replace that evaluation, and the importance of remaining in the ED until their evaluation is complete.     Shelly Flatten [place], PA-C 07/14/21 1339    07/16/21, MD 07/15/21 [phone]

## 2021-07-17 ENCOUNTER — Other Ambulatory Visit: Payer: Self-pay

## 2021-07-18 ENCOUNTER — Encounter: Payer: Self-pay | Admitting: Family Medicine

## 2021-07-18 ENCOUNTER — Ambulatory Visit (INDEPENDENT_AMBULATORY_CARE_PROVIDER_SITE_OTHER): Payer: Medicare HMO | Admitting: Family Medicine

## 2021-07-18 ENCOUNTER — Ambulatory Visit (INDEPENDENT_AMBULATORY_CARE_PROVIDER_SITE_OTHER)
Admission: RE | Admit: 2021-07-18 | Discharge: 2021-07-18 | Disposition: A | Discharge status: Home / Self Care | Payer: Medicare HMO | Source: Ambulatory Visit | Attending: Family Medicine | Admitting: Family Medicine

## 2021-07-18 VITALS — BP 150/92 | HR 71 | Temp 99.0°F | Wt 189.6 lb

## 2021-07-18 DIAGNOSIS — R600 Localized edema: Secondary | ICD-10-CM

## 2021-07-18 DIAGNOSIS — S060X0D Concussion without loss of consciousness, subsequent encounter: Secondary | ICD-10-CM

## 2021-07-18 DIAGNOSIS — S0993XA Unspecified injury of face, initial encounter: Secondary | ICD-10-CM | POA: Diagnosis not present

## 2021-07-18 NOTE — Patient Instructions (Signed)
We will obtain additional imaging to rule out orbital floor fracture.    Continue supportive care including mental rest.  Avoid heavy lifting, increased activities.  For any worsened or new symptoms proceed to nearest ED.

## 2021-07-18 NOTE — Progress Notes (Signed)
Subjective:    Patient ID: Jeanne [name], female    DOB: 01-Jul-1956, 65 y.o.   MRN: [id number]  Chief Complaint  Patient presents with   Follow-up    Tripped and fell on 10/21, hit head. Went to ED 10/22    HPI Patient was seen today for ED follow-up.  Patient seen in ED on 07/14/2021 after a trip and fall.  Patient states she fell Friday night 10/21, while trying to pick up a cooler she had just cleaned.  Pt states she ran into a tree, hit her head and landed on her left side on the ground.  Denies LOC.  Went to the ED the next day 10/22.  Advised imaging was negative sent.  Since being at home pt notes the edema and left forehead spread across her forehead and down into periorbital area and the rest of face.  Patient's eyes were swollen shut on Sunday and Monday.  Ecchymosis surrounding both eyes developed Tuesday.  Patient taking Tylenol for headaches in the middle of her head and going to sleep.  Patient also notes eyes watering more.  Past Medical History:  Diagnosis Date   Anemia    Arthritis    Diabetes mellitus    Hypercholesteremia 2011?   Hypertension    NSTEMI (non-ST elevated myocardial infarction) [facility]) December 2013   negative cath, normal LV function - felt to be vasospasm   Thyroid disease     Allergies  Allergen Reactions   Eggs Or Egg-Derived Products Other (See Comments)    Unknown reaction-patient states she was allergic to egg yolks when she was little. She can eat eggs now, but does not take flu shot or other egg-containing medications.   Penicillins Other (See Comments)    Unknown-reaction as a child    Pollen Extract     ROS General: Denies fever, chills, night sweats, changes in weight, changes in appetite HEENT: Denies headaches, ear pain, changes in vision, rhinorrhea, sore throat +HAs, periorbital edema and ecchymosis, facial edema CV: Denies CP, palpitations, SOB, orthopnea Pulm: Denies SOB, cough, wheezing GI: Denies abdominal pain,  nausea, vomiting, diarrhea, constipation GU: Denies dysuria, hematuria, frequency, vaginal discharge Msk: Denies muscle cramps, joint pains + left knee pain, right shoulder pain, left side soreness Neuro: Denies weakness, numbness, tingling Skin: Denies rashes, bruising Psych: Denies depression, anxiety, hallucinations     Objective:    Blood pressure (!) 150/92, pulse 71, temperature 99 F (37.2 C), temperature source Oral, weight 189 lb 9.6 oz (86 kg), SpO2 96 %.  Gen. Pleasant, well-nourished, in no distress, normal affect  HEENT: [place]/AT, face symmetric, conjunctiva clear, no scleral icterus, PERRLA, EOMI, right and upper gaze elicits a "pulling sensation" in R eye. nares patent without drainage Lungs: no accessory muscle use, CTAB, no wheezes or rales Cardiovascular: RRR, no m/r/g, no peripheral edema. Musculoskeletal: No deformities, no cyanosis or clubbing, normal tone Neuro:  A&Ox3, CN II-XII intact, normal gait Skin:  Warm, no lesions/ rash.  Periorbital ecchymosis.  Scalp hematoma left head/forehead  Wt Readings from Last 3 Encounters:  07/18/21 189 lb 9.6 oz (86 kg)  07/14/21 187 lb (84.8 kg)  06/27/21 187 lb 11.2 oz (85.1 kg)    Lab Results  Component Value Date   WBC 5.4 06/27/2021   HGB 12.9 06/27/2021   HCT 39.5 06/27/2021   PLT 307.0 06/27/2021   GLUCOSE 124 (H) 06/27/2021   CHOL 145 06/27/2021   TRIG 85.0 06/27/2021   HDL 46.20 06/27/2021  LDLCALC 82 06/27/2021   ALT 21 06/07/2020   AST 12 06/07/2020   NA 138 06/27/2021   K 3.9 06/27/2021   CL 99 06/27/2021   CREATININE 0.78 06/27/2021   BUN 15 06/27/2021   CO2 28 06/27/2021   TSH 1.22 06/27/2021   INR 1.06 09/22/2012   HGBA1C 7.5 (H) 06/27/2021   MICROALBUR 1.1 12/01/2019    Assessment/Plan:  Concussion without loss of consciousness, subsequent encounter  Periorbital edema of both eyes - Plan: CT Maxillofacial WO CM  Concern for periorbital fracture given periorbital ecchymosis and difficulty  with R and upper gaze causing "pulling sensation".  Reviewed CT head without contrast 07/14/2021 with no intracranial abnormality.  Dural calcification which is a normal variant.  No skull fracture, frontal scalp swelling, left worse than right.  Patient given strict precautions.  Also advised of concussion 2/2 recent head trauma.  Mental rest advised.  Given handout.  F/u as needed.  For continued or worsening symptoms proceed to ED  Abbe Amsterdam, MD

## 2021-08-06 ENCOUNTER — Encounter: Payer: Self-pay | Admitting: Family Medicine

## 2021-08-08 DIAGNOSIS — Z1231 Encounter for screening mammogram for malignant neoplasm of breast: Secondary | ICD-10-CM | POA: Diagnosis not present

## 2021-08-13 ENCOUNTER — Ambulatory Visit: Payer: Medicare HMO | Admitting: Family Medicine

## 2021-08-13 ENCOUNTER — Encounter: Payer: Self-pay | Admitting: Family Medicine

## 2021-08-13 ENCOUNTER — Ambulatory Visit (INDEPENDENT_AMBULATORY_CARE_PROVIDER_SITE_OTHER): Payer: Medicare HMO | Admitting: Family Medicine

## 2021-08-13 VITALS — BP 130/84 | HR 95 | Temp 98.9°F | Wt 187.4 lb

## 2021-08-13 DIAGNOSIS — H9313 Tinnitus, bilateral: Secondary | ICD-10-CM | POA: Diagnosis not present

## 2021-08-13 DIAGNOSIS — S060X0D Concussion without loss of consciousness, subsequent encounter: Secondary | ICD-10-CM

## 2021-08-13 NOTE — Progress Notes (Signed)
Subjective:    Patient ID: Jeanne [name], female    DOB: 03/22/1956, 65 y.o.   MRN: [id number]  Chief Complaint  Patient presents with   Follow-up    Ringing in ears.    HPI Patient was seen today for f/u s/p concussion.  Pt having sensitivity to sound, tinnitus, HAs if she does too much.  Symptoms worse in late evening.  Pt states she is still unable to read a book on her kindle.  Pt notes if she leans over or bends over she has symptoms.  Intermittent HAs with increased activity relieved by ASA or Tylenol.  Drinking 4-5 cups of black tea per day.    Past Medical History:  Diagnosis Date   Anemia    Arthritis    Diabetes mellitus    Hypercholesteremia 2011?   Hypertension    NSTEMI (non-ST elevated myocardial infarction) [facility]) December 2013   negative cath, normal LV function - felt to be vasospasm   Thyroid disease     Allergies  Allergen Reactions   Eggs Or Egg-Derived Products Other (See Comments)    Unknown reaction-patient states she was allergic to egg yolks when she was little. She can eat eggs now, but does not take flu shot or other egg-containing medications.   Penicillins Other (See Comments)    Unknown-reaction as a child    Pollen Extract     ROS General: Denies fever, chills, night sweats, changes in weight, changes in appetite HEENT: Denies headaches, ear pain, changes in vision, rhinorrhea, sore throat  +tinnitus, sensitivity to sound, HAs CV: Denies CP, palpitations, SOB, orthopnea Pulm: Denies SOB, cough, wheezing GI: Denies abdominal pain, nausea, vomiting, diarrhea, constipation GU: Denies dysuria, hematuria, frequency, vaginal discharge Msk: Denies muscle cramps, joint pains Neuro: Denies weakness, numbness, tingling Skin: Denies rashes, bruising Psych: Denies depression, anxiety, hallucinations    Objective:    Blood pressure 130/84, pulse 95, temperature 98.9 F (37.2 C), temperature source Oral, weight 187 lb 6.4 oz (85 kg), SpO2 99  %.  Gen. Pleasant, well-nourished, in no distress, normal affect   HEENT: [place]/AT, face symmetric, conjunctiva clear, no scleral icterus, PERRLA, EOMI, no nystagmus, nares patent without drainage.  TMs normal b/l. Lungs: no accessory muscle use, CTAB, no wheezes or rales Cardiovascular: RRR, no m/r/g, no peripheral edema Musculoskeletal: No deformities, no cyanosis or clubbing, normal tone Neuro:  A&Ox3, CN II-XII intact, normal gait Skin:  Warm, no lesions/ rash   Wt Readings from Last 3 Encounters:  08/13/21 187 lb 6.4 oz (85 kg)  07/18/21 189 lb 9.6 oz (86 kg)  07/14/21 187 lb (84.8 kg)    Lab Results  Component Value Date   WBC 5.4 06/27/2021   HGB 12.9 06/27/2021   HCT 39.5 06/27/2021   PLT 307.0 06/27/2021   GLUCOSE 124 (H) 06/27/2021   CHOL 145 06/27/2021   TRIG 85.0 06/27/2021   HDL 46.20 06/27/2021   LDLCALC 82 06/27/2021   ALT 21 06/07/2020   AST 12 06/07/2020   NA 138 06/27/2021   K 3.9 06/27/2021   CL 99 06/27/2021   CREATININE 0.78 06/27/2021   BUN 15 06/27/2021   CO2 28 06/27/2021   TSH 1.22 06/27/2021   INR 1.06 09/22/2012   HGBA1C 7.5 (H) 06/27/2021   MICROALBUR 1.1 12/01/2019    Assessment/Plan:  Tinnitus of both ears -advised symptoms may continue or improve on their own with time. -can do things to drown out the noise if bothersome. -CT head in ED  on 07/14/21 reassuring as no intercranial abnormality noted. -continue to monitor  Concussion without loss of consciousness, subsequent encounter -stable -discussed expected symptoms -advised will take time for symptoms to improve.  Continue mental rest and avoid over exertion.  F/u prn  Abbe [place], MD

## 2021-08-15 NOTE — Progress Notes (Signed)
Cardiology Office Note:    Date:  08/20/2021   ID:  Jeanne [name], DOB 1956/07/29, MRN [id number]  PCP:  Jeanne Ruddy, MD  [facility] Cardiologist:  Lauree Chandler, MD  [place] Electrophysiologist:  None   Chief Complaint: yearly follow up   History of Present Illness:    Jeanne [name] is a 65 y.o. female with a hx of possible coronary vasospasm, hypertension, RBBB, hyperlipidemia and diabetes mellitus presents for yearly follow-up  Cardiac catheterization December 2013 showed normal coronaries in setting of non-STEMI.  It was felt she represent coronary vasospasm versus myocarditis.  Her EF was normal.  She was doing well on cardiac standpoint when seen by APP  December 2021.  Here today for follow up. Had mechanical fall 06/2021. Negative head CT. She does walker aerobic 3 days/week. Also walks few days/week. No chest pain, SOB, dizziness, palpitations, orthopnea, PNd, syncope, LE edema or melena.   Past Medical History:  Diagnosis Date   Anemia    Arthritis    Diabetes mellitus    Hypercholesteremia 2011?   Hypertension    NSTEMI (non-ST elevated myocardial infarction) [facility]) December 2013   negative cath, normal LV function - felt to be vasospasm   Thyroid disease     Past Surgical History:  Procedure Laterality Date   CARDIAC CATHETERIZATION  December 2013   normal coronaries and normal LV function   FOOT SURGERY     LEFT HEART CATHETERIZATION WITH CORONARY ANGIOGRAM N/A 09/22/2012   Procedure: LEFT HEART CATHETERIZATION WITH CORONARY ANGIOGRAM;  Surgeon: Sherren Mocha, MD;  Location: [facility] CATH LAB;  Service: Cardiovascular;  Laterality: N/A;   THYROID SURGERY     Partial removed per pt    Current Medications: Current Meds  Medication Sig   Accu-Chek FastClix Lancets MISC TEST BLOOD SUGAR DAILY   amLODipine (NORVASC) 5 MG tablet Take 1 tablet (5 mg total) by mouth daily.   aspirin 81 MG tablet Take 81 mg by mouth daily.    blood glucose meter kit and supplies KIT Dispense based on patient and insurance preference. Use up to four times daily as directed. (FOR ICD-9 250.00, 250.01).   CALCIUM PO Take 1 tablet by mouth daily. Chewable calcium   carvedilol (COREG) 3.125 MG tablet Take 1 tablet (3.125 mg total) by mouth 2 (two) times daily with a meal.   levothyroxine (SYNTHROID) 75 MCG tablet TAKE 1 TABLET BY MOUTH EVERY MORNING ON AN EMPTY STOMACH   metFORMIN (GLUCOPHAGE) 500 MG tablet TAKE 2 TABLETS TWICE DAILY   nitroGLYCERIN (NITROSTAT) 0.4 MG SL tablet Place 1 tablet (0.4 mg total) under the tongue every 5 (five) minutes x 3 doses as needed for chest pain.   ONE TOUCH ULTRA TEST test strip    pravastatin (PRAVACHOL) 10 MG tablet Take 1 tablet (10 mg total) by mouth daily.   sitaGLIPtin (JANUVIA) 100 MG tablet Take 1 tablet (100 mg total) by mouth daily.   valsartan-hydrochlorothiazide (DIOVAN-HCT) 160-25 MG tablet Take 1 tablet by mouth daily.     Allergies:   Eggs or egg-derived products, Penicillins, and Pollen extract   Social History   Socioeconomic History   Marital status: Single    Spouse name: Not on file   Number of children: Not on file   Years of education: Not on file   Highest education level: Bachelor's degree (e.g., BA, AB, BS)  Occupational History   Not on file  Tobacco Use   Smoking status: Former  Types: Cigarettes    Quit date: 11/21/1990    Years since quitting: 30.7   Smokeless tobacco: Never  Vaping Use   Vaping Use: Never used  Substance and Sexual Activity   Alcohol use: No   Drug use: No   Sexual activity: Yes  Other Topics Concern   Not on file  Social History Narrative   Not on file   Social Determinants of Health   Financial Resource Strain: Medium Risk   Difficulty of Paying Living Expenses: Somewhat hard  Food Insecurity: No Food Insecurity   Worried About Charity fundraiser in the Last Year: Never true   Ran Out of Food in the Last Year: Never true   Transportation Needs: No Transportation Needs   Lack of Transportation (Medical): No   Lack of Transportation (Non-Medical): No  Physical Activity: Insufficiently Active   Days of Exercise per Week: 3 days   Minutes of Exercise per Session: 20 min  Stress: No Stress Concern Present   Feeling of Stress : Only a little  Social Connections: Moderately Integrated   Frequency of Communication with Friends and Family: More than three times a week   Frequency of Social Gatherings with Friends and Family: Twice a week   Attends Religious Services: More than 4 times per year   Active Member of [employer] or Organizations: Yes   Attends Music therapist: More than 4 times per year   Marital Status: Divorced     Family History: The patient's family history is not on file.    ROS:   Please see the history of present illness.    All other systems reviewed and are negative.   EKGs/Labs/Other Studies Reviewed:    The following studies were reviewed today:  Cath 08/2012 Coronary angiography: Coronary dominance: right   Left mainstem: No significant coronary disease.    Left anterior descending (LAD): No significant coronary disease.    Left circumflex (LCx): No significant coronary disease.    Right coronary artery (RCA): No significant coronary disease.    Left ventriculography: Left ventricular systolic function is normal, LVEF is estimated at 60-65%, there is no significant mitral regurgitation. Normal wall motion.    Final Conclusions:  No angiographic CAD.  Normal LV systolic function with no evidence for Takotsubo syndrome.  Patient does not describe symptoms consistent with a viral syndrome to suggest acute myocarditis.  Coronary vasospasm is a possibility for the troponin elevation. Will add amlodipine to medical regimen as she is also hypertensive.    Loralie Champagne 09/22/2012, 2:09 PM  EKG:  EKG is  ordered today.  The ekg ordered today demonstrates SR,  RBBB  Recent Labs: 06/27/2021: BUN 15; Creatinine, Ser 0.78; Hemoglobin 12.9; Platelets 307.0; Potassium 3.9; Sodium 138; TSH 1.22  Recent Lipid Panel    Component Value Date/Time   CHOL 145 06/27/2021 1037   TRIG 85.0 06/27/2021 1037   HDL 46.20 06/27/2021 1037   CHOLHDL 3 06/27/2021 1037   VLDL 17.0 06/27/2021 1037   LDLCALC 82 06/27/2021 1037   LDLCALC 90 06/07/2020 1049     Risk Assessment/Calculations:       Physical Exam:    VS:  BP 132/80   Pulse 91   Ht $R'5\' 6"'JF$  (1.676 m)   Wt 187 lb 3.2 oz (84.9 kg)   SpO2 98%   BMI 30.21 kg/m     Wt Readings from Last 3 Encounters:  08/20/21 187 lb 3.2 oz (84.9 kg)  08/13/21 187 lb 6.4  oz (85 kg)  07/18/21 189 lb 9.6 oz (86 kg)     GEN:  Well nourished, well developed in no acute distress HEENT: Normal NECK: No JVD; No carotid bruits LYMPHATICS: No lymphadenopathy CARDIAC: RRR, no murmurs, rubs, gallops RESPIRATORY:  Clear to auscultation without rales, wheezing or rhonchi  ABDOMEN: Soft, non-tender, non-distended MUSCULOSKELETAL:  No edema; No deformity  SKIN: Warm and dry NEUROLOGIC:  Alert and oriented x 3 PSYCHIATRIC:  Normal affect   ASSESSMENT AND PLAN:    History of possible coronary vasospasm in 2013 -Cardiac cath at that time showed normal coronary. No anginal symptoms. Continue current exercise regimen.  -Continue ASA, BB and statin   2.  Hypertension - BP stable and controlled on current medications   3.  Hyperlipidemia  - 06/27/2021: Cholesterol 145; HDL 46.20; LDL Cholesterol 82; Triglycerides 85.0; VLDL 17.0  - Managed by PCP - Continue statin   4. DM - Per PCP - Most recent HgbA1c 7.5  Medication Adjustments/Labs and Tests Ordered: Current medicines are reviewed at length with the patient today.  Concerns regarding medicines are outlined above.  Orders Placed This Encounter  Procedures   EKG 12-Lead   No orders of the defined types were placed in this encounter.   Patient Instructions   Medication Instructions:  Your physician recommends that you continue on your current medications as directed. Please refer to the Current Medication list given to you today.  *If you need a refill on your cardiac medications before your next appointment, please call your pharmacy*   Lab Work: None ordered  If you have labs (blood work) drawn today and your tests are completely normal, you will receive your results only by: [place] (if you have MyChart) OR A paper copy in the mail If you have any lab test that is abnormal or we need to change your treatment, we will call you to review the results.   Testing/Procedures: None ordered   Follow-Up: At [facility], you and your health needs are our priority.  As part of our continuing mission to provide you with exceptional heart care, we have created designated Provider Care Teams.  These Care Teams include your primary Cardiologist (physician) and Advanced Practice Providers (APPs -  Physician Assistants and Nurse Practitioners) who all work together to provide you with the care you need, when you need it.  We recommend signing up for the patient portal called "MyChart".  Sign up information is provided on this After Visit Summary.  MyChart is used to connect with patients for Virtual Visits (Telemedicine).  Patients are able to view lab/test results, encounter notes, upcoming appointments, etc.  Non-urgent messages can be sent to your provider as well.   To learn more about what you can do with MyChart, go to [email or web address].    Your next appointment:   12 month(s)  The format for your next appointment:   In Person  Provider:   Lauree Chandler, MD  or Robbie Lis, PA-C, Melina Copa, PA-C, Ermalinda Barrios, PA-C, or Richardson Dopp, PA-C         Other Instructions    Signed, Leanor Kail, [place]  08/20/2021 8:31 AM    [place]

## 2021-08-20 ENCOUNTER — Other Ambulatory Visit: Payer: Self-pay

## 2021-08-20 ENCOUNTER — Encounter: Payer: Self-pay | Admitting: Physician Assistant

## 2021-08-20 ENCOUNTER — Ambulatory Visit: Payer: Medicare HMO | Admitting: Physician Assistant

## 2021-08-20 VITALS — BP 132/80 | HR 91 | Ht 66.0 in | Wt 187.2 lb

## 2021-08-20 DIAGNOSIS — I10 Essential (primary) hypertension: Secondary | ICD-10-CM

## 2021-08-20 DIAGNOSIS — I4510 Unspecified right bundle-branch block: Secondary | ICD-10-CM | POA: Diagnosis not present

## 2021-08-20 DIAGNOSIS — E785 Hyperlipidemia, unspecified: Secondary | ICD-10-CM | POA: Diagnosis not present

## 2021-08-20 DIAGNOSIS — I201 Angina pectoris with documented spasm: Secondary | ICD-10-CM | POA: Insufficient documentation

## 2021-08-20 DIAGNOSIS — E1169 Type 2 diabetes mellitus with other specified complication: Secondary | ICD-10-CM

## 2021-08-20 NOTE — Patient Instructions (Signed)
Medication Instructions:  Your physician recommends that you continue on your current medications as directed. Please refer to the Current Medication list given to you today.  *If you need a refill on your cardiac medications before your next appointment, please call your pharmacy*   Lab Work: None ordered  If you have labs (blood work) drawn today and your tests are completely normal, you will receive your results only by: MyChart Message (if you have MyChart) OR A paper copy in the mail If you have any lab test that is abnormal or we need to change your treatment, we will call you to review the results.   Testing/Procedures: None ordered   Follow-Up: At [facility], you and your health needs are our priority.  As part of our continuing mission to provide you with exceptional heart care, we have created designated Provider Care Teams.  These Care Teams include your primary Cardiologist (physician) and Advanced Practice Providers (APPs -  Physician Assistants and Nurse Practitioners) who all work together to provide you with the care you need, when you need it.  We recommend signing up for the patient portal called "MyChart".  Sign up information is provided on this After Visit Summary.  MyChart is used to connect with patients for Virtual Visits (Telemedicine).  Patients are able to view lab/test results, encounter notes, upcoming appointments, etc.  Non-urgent messages can be sent to your provider as well.   To learn more about what you can do with MyChart, go to [email or web address].    Your next appointment:   12 month(s)  The format for your next appointment:   In Person  Provider:   Verne Carrow, MD  or Chelsea Aus, PA-C, Ronie Spies, PA-C, Jacolyn Reedy, PA-C, or Tereso Newcomer, [place]         Other Instructions

## 2021-08-27 ENCOUNTER — Encounter: Payer: Self-pay | Admitting: Family Medicine

## 2021-08-27 ENCOUNTER — Ambulatory Visit (INDEPENDENT_AMBULATORY_CARE_PROVIDER_SITE_OTHER): Payer: Medicare HMO | Admitting: Family Medicine

## 2021-08-27 VITALS — BP 132/94 | HR 78 | Temp 99.0°F | Wt 186.2 lb

## 2021-08-27 DIAGNOSIS — H9319 Tinnitus, unspecified ear: Secondary | ICD-10-CM | POA: Diagnosis not present

## 2021-08-27 DIAGNOSIS — E1169 Type 2 diabetes mellitus with other specified complication: Secondary | ICD-10-CM | POA: Diagnosis not present

## 2021-08-27 DIAGNOSIS — S060X0D Concussion without loss of consciousness, subsequent encounter: Secondary | ICD-10-CM

## 2021-08-27 DIAGNOSIS — E785 Hyperlipidemia, unspecified: Secondary | ICD-10-CM | POA: Diagnosis not present

## 2021-08-27 DIAGNOSIS — I10 Essential (primary) hypertension: Secondary | ICD-10-CM | POA: Diagnosis not present

## 2021-08-27 NOTE — Progress Notes (Signed)
Subjective:    Patient ID: Jeanne [name], female    DOB: [birth year], 65 y.o.   MRN: [id number]  Chief Complaint  Patient presents with   Follow-up    BP and DM    HPI Patient was seen today for follow-up on ongoing concerns.  Patient endorses ear still ringing and sensitivity to sound status post concussion 07/14/2021.  Tinnitus worse when patient was tired/has been doing too much.  Pt denies headache, dizziness.  Pt cut down her tea intake from 4-5 cups to 2 cups of tea per day.  Drinking more water.  States blood sugar at home 121, 132, 56, 141, 183.  Eating 2 meals per day.  BP good at home, 120s/78-85.  Had recent cardiology visit which went well.  Past Medical History:  Diagnosis Date   Anemia    Arthritis    Diabetes mellitus    Hypercholesteremia 2011?   Hypertension    NSTEMI (non-ST elevated myocardial infarction) [facility]) December 2013   negative cath, normal LV function - felt to be vasospasm   Thyroid disease     Allergies  Allergen Reactions   Eggs Or Egg-Derived Products Other (See Comments)    Unknown reaction-patient states she was allergic to egg yolks when she was little. She can eat eggs now, but does not take flu shot or other egg-containing medications.   Penicillins Other (See Comments)    Unknown-reaction as a child    Pollen Extract     ROS General: Denies fever, chills, night sweats, changes in weight, changes in appetite + recent concussion HEENT: Denies headaches, ear pain, changes in vision, rhinorrhea, sore throat + tinnitus, sensitivity to sound CV: Denies CP, palpitations, SOB, orthopnea Pulm: Denies SOB, cough, wheezing GI: Denies abdominal pain, nausea, vomiting, diarrhea, constipation GU: Denies dysuria, hematuria, frequency, vaginal discharge Msk: Denies muscle cramps, joint pains Neuro: Denies weakness, numbness, tingling Skin: Denies rashes, bruising Psych: Denies depression, anxiety, hallucinations     Objective:    Blood  pressure (!) 132/94, pulse 78, temperature 99 F (37.2 C), temperature source Oral, weight 186 lb 3.2 oz (84.5 kg), SpO2 95 %.  Gen. Pleasant, well-nourished, in no distress, normal affect   HEENT: [place]/AT, face symmetric, conjunctiva clear, no scleral icterus, PERRLA, EOMI, nares patent without drainage.  TMs normal b/l. Lungs: no accessory muscle use, CTAB, no wheezes or rales Cardiovascular: RRR, no peripheral edema Musculoskeletal: No deformities, no cyanosis or clubbing, normal tone Neuro:  A&Ox3, CN II-XII intact, normal gait Skin:  Warm, no lesions/ rash  Diabetic Foot Exam - Simple   Simple Foot Form Diabetic Foot exam was performed with the following findings: Yes 08/27/2021 10:16 AM  Visual Inspection See comments: Yes Sensation Testing Intact to touch and monofilament testing bilaterally: Yes Pulse Check Posterior Tibialis and Dorsalis pulse intact bilaterally: Yes Comments Callus on the medial right great toe.  No ulcerations or other skin breakdown noted.  Slightly diminished PT and DP pulses bilaterally.      Wt Readings from Last 3 Encounters:  08/27/21 186 lb 3.2 oz (84.5 kg)  08/20/21 187 lb 3.2 oz (84.9 kg)  08/13/21 187 lb 6.4 oz (85 kg)    Lab Results  Component Value Date   WBC 5.4 06/27/2021   HGB 12.9 06/27/2021   HCT 39.5 06/27/2021   PLT 307.0 06/27/2021   GLUCOSE 124 (H) 06/27/2021   CHOL 145 06/27/2021   TRIG 85.0 06/27/2021   HDL 46.20 06/27/2021   LDLCALC 82 06/27/2021  ALT 21 06/07/2020   AST 12 06/07/2020   NA 138 06/27/2021   K 3.9 06/27/2021   CL 99 06/27/2021   CREATININE 0.78 06/27/2021   BUN 15 06/27/2021   CO2 28 06/27/2021   TSH 1.22 06/27/2021   INR 1.06 09/22/2012   HGBA1C 7.5 (H) 06/27/2021   MICROALBUR 1.1 12/01/2019    Assessment/Plan:  Tinnitus, unspecified laterality -Likely 2/2 recent concussion -Discussed supportive care -Advised symptoms may improve/resolve on their own or persist. -Reassuring imaging CT head  without contrast 07/14/2021 -Patient wishes to wait 4-6 weeks.  For continued symptoms we will place referral to ENT.  Essential hypertension -Elevated in clinic, controlled at other visits -Recheck -Continue lifestyle modifications including decreasing sodium intake -Continue current medications valsartan-hydrochlorothiazide 160-25 mg daily, Norvasc 5 mg daily, Coreg 3.125 mg twice daily -Close follow-up.  For continued elevation increase Coreg.  Type 2 diabetes mellitus with hyperlipidemia (HCC) -Hgb A1C 7.5% on 06/27/21 -lifestyle modifications -Continue pravastatin 10 mg -continue metformin 500 mg BID, januvia 100 mg  F/u in 5-6 wks, sooner if needed  Abbe Amsterdam, MD

## 2021-10-01 DIAGNOSIS — E119 Type 2 diabetes mellitus without complications: Secondary | ICD-10-CM | POA: Diagnosis not present

## 2021-10-01 LAB — HM DIABETES EYE EXAM

## 2021-10-05 ENCOUNTER — Encounter: Payer: Self-pay | Admitting: Family Medicine

## 2021-10-24 ENCOUNTER — Ambulatory Visit (INDEPENDENT_AMBULATORY_CARE_PROVIDER_SITE_OTHER): Payer: Medicare HMO

## 2021-10-24 VITALS — BP 130/68 | HR 62 | Temp 98.4°F | Ht 66.0 in | Wt 187.3 lb

## 2021-10-24 DIAGNOSIS — M810 Age-related osteoporosis without current pathological fracture: Secondary | ICD-10-CM | POA: Diagnosis not present

## 2021-10-24 DIAGNOSIS — Z0000 Encounter for general adult medical examination without abnormal findings: Secondary | ICD-10-CM | POA: Diagnosis not present

## 2021-10-24 NOTE — Patient Instructions (Addendum)
Jeanne [name] , Thank you for taking time to come for your Medicare Wellness Visit. I appreciate your ongoing commitment to your health goals. Please review the following plan we discussed and let me know if I can assist you in the future.   These are the goals we discussed:  Goals      Increase physical activity     Increase physical activity. Lose 10-15 lbs        This is a list of the screening recommended for you and due dates:  Health Maintenance  Topic Date Due   Zoster (Shingles) Vaccine (1 of 2) Never done   DEXA scan (bone density measurement)  Never done   COVID-19 Vaccine (5 - Moderna series) 09/28/2021   Pneumonia Vaccine (1 - PCV) 10/24/2022*   Flu Shot  04/23/2022   Hemoglobin A1C  08/10/2022   Complete foot exam   08/27/2022   Eye exam for diabetics  10/01/2022   Mammogram  08/09/2023   Tetanus Vaccine  12/06/2023   Colon Cancer Screening  04/16/2027   Hepatitis C Screening: USPSTF Recommendation to screen - Ages 18-79 yo.  Completed   HPV Vaccine  Aged Out  *Topic was postponed. The date shown is not the original due date.   Advanced directives: Yes Patient will bring copy  Conditions/risks identified: None  Next appointment: Follow up in one year for your annual wellness visit    Preventive Care 65 Years and Older, Female Preventive care refers to lifestyle choices and visits with your health care provider that can promote health and wellness. What does preventive care include? A yearly physical exam. This is also called an annual well check. Dental exams once or twice a year. Routine eye exams. Ask your health care provider how often you should have your eyes checked. Personal lifestyle choices, including: Daily care of your teeth and gums. Regular physical activity. Eating a healthy diet. Avoiding tobacco and drug use. Limiting alcohol use. Practicing safe sex. Taking low-dose aspirin every day. Taking vitamin and mineral supplements as  recommended by your health care provider. What happens during an annual well check? The services and screenings done by your health care provider during your annual well check will depend on your age, overall health, lifestyle risk factors, and family history of disease. Counseling  Your health care provider may ask you questions about your: Alcohol use. Tobacco use. Drug use. Emotional well-being. Home and relationship well-being. Sexual activity. Eating habits. History of falls. Memory and ability to understand (cognition). Work and work Statistician. Reproductive health. Screening  You may have the following tests or measurements: Height, weight, and BMI. Blood pressure. Lipid and cholesterol levels. These may be checked every 5 years, or more frequently if you are over 1 years old. Skin check. Lung cancer screening. You may have this screening every year starting at age 61 if you have a 30-pack-year history of smoking and currently smoke or have quit within the past 15 years. Fecal occult blood test (FOBT) of the stool. You may have this test every year starting at age 61. Flexible sigmoidoscopy or colonoscopy. You may have a sigmoidoscopy every 5 years or a colonoscopy every 10 years starting at age 24. Hepatitis C blood test. Hepatitis B blood test. Sexually transmitted disease (STD) testing. Diabetes screening. This is done by checking your blood sugar (glucose) after you have not eaten for a while (fasting). You may have this done every 1-3 years. Bone density scan. This is done to screen  for osteoporosis. You may have this done starting at age 36. Mammogram. This may be done every 1-2 years. Talk to your health care provider about how often you should have regular mammograms. Talk with your health care provider about your test results, treatment options, and if necessary, the need for more tests. Vaccines  Your health care provider may recommend certain vaccines, such  as: Influenza vaccine. This is recommended every year. Tetanus, diphtheria, and acellular pertussis (Tdap, Td) vaccine. You may need a Td booster every 10 years. Zoster vaccine. You may need this after age 63. Pneumococcal 13-valent conjugate (PCV13) vaccine. One dose is recommended after age 30. Pneumococcal polysaccharide (PPSV23) vaccine. One dose is recommended after age 36. Talk to your health care provider about which screenings and vaccines you need and how often you need them. This information is not intended to replace advice given to you by your health care provider. Make sure you discuss any questions you have with your health care provider. Document Released: 10/06/2015 Document Revised: 05/29/2016 Document Reviewed: 07/11/2015 Elsevier Interactive Patient Education  2017 [place] Prevention in the Home Falls can cause injuries. They can happen to people of all ages. There are many things you can do to make your home safe and to help prevent falls. What can I do on the outside of my home? Regularly fix the edges of walkways and driveways and fix any cracks. Remove anything that might make you trip as you walk through a door, such as a raised step or threshold. Trim any bushes or trees on the path to your home. Use bright outdoor lighting. Clear any walking paths of anything that might make someone trip, such as rocks or tools. Regularly check to see if handrails are loose or broken. Make sure that both sides of any steps have handrails. Any raised decks and porches should have guardrails on the edges. Have any leaves, snow, or ice cleared regularly. Use sand or salt on walking paths during winter. Clean up any spills in your garage right away. This includes oil or grease spills. What can I do in the bathroom? Use night lights. Install grab bars by the toilet and in the tub and shower. Do not use towel bars as grab bars. Use non-skid mats or decals in the tub or  shower. If you need to sit down in the shower, use a plastic, non-slip stool. Keep the floor dry. Clean up any water that spills on the floor as soon as it happens. Remove soap buildup in the tub or shower regularly. Attach bath mats securely with double-sided non-slip rug tape. Do not have throw rugs and other things on the floor that can make you trip. What can I do in the bedroom? Use night lights. Make sure that you have a light by your bed that is easy to reach. Do not use any sheets or blankets that are too big for your bed. They should not hang down onto the floor. Have a firm chair that has side arms. You can use this for support while you get dressed. Do not have throw rugs and other things on the floor that can make you trip. What can I do in the kitchen? Clean up any spills right away. Avoid walking on wet floors. Keep items that you use a lot in easy-to-reach places. If you need to reach something above you, use a strong step stool that has a grab bar. Keep electrical cords out of the way. Do  not use floor polish or wax that makes floors slippery. If you must use wax, use non-skid floor wax. Do not have throw rugs and other things on the floor that can make you trip. What can I do with my stairs? Do not leave any items on the stairs. Make sure that there are handrails on both sides of the stairs and use them. Fix handrails that are broken or loose. Make sure that handrails are as long as the stairways. Check any carpeting to make sure that it is firmly attached to the stairs. Fix any carpet that is loose or worn. Avoid having throw rugs at the top or bottom of the stairs. If you do have throw rugs, attach them to the floor with carpet tape. Make sure that you have a light switch at the top of the stairs and the bottom of the stairs. If you do not have them, ask someone to add them for you. What else can I do to help prevent falls? Wear shoes that: Do not have high heels. Have  rubber bottoms. Are comfortable and fit you well. Are closed at the toe. Do not wear sandals. If you use a stepladder: Make sure that it is fully opened. Do not climb a closed stepladder. Make sure that both sides of the stepladder are locked into place. Ask someone to hold it for you, if possible. Clearly mark and make sure that you can see: Any grab bars or handrails. First and last steps. Where the edge of each step is. Use tools that help you move around (mobility aids) if they are needed. These include: Canes. Walkers. Scooters. Crutches. Turn on the lights when you go into a dark area. Replace any light bulbs as soon as they burn out. Set up your furniture so you have a clear path. Avoid moving your furniture around. If any of your floors are uneven, fix them. If there are any pets around you, be aware of where they are. Review your medicines with your doctor. Some medicines can make you feel dizzy. This can increase your chance of falling. Ask your doctor what other things that you can do to help prevent falls. This information is not intended to replace advice given to you by your health care provider. Make sure you discuss any questions you have with your health care provider. Document Released: 07/06/2009 Document Revised: 02/15/2016 Document Reviewed: 10/14/2014 Elsevier Interactive Patient Education  2017 [employer].

## 2021-10-24 NOTE — Progress Notes (Signed)
Subjective:   Jeanne [name] is a 66 y.o. female who presents for Medicare Annual (Subsequent) preventive examination.  Review of Systems     Cardiac Risk Factors include: advanced age (>27mn, >>59women);diabetes mellitus;hypertension     Objective:    Today's Vitals   10/24/21 1311  BP: 130/68  Pulse: 62  Temp: 98.4 F (36.9 C)  TempSrc: Oral  SpO2: 99%  Weight: 187 lb 4.8 oz (85 kg)  Height: 5' 6" (1.676 m)   Body mass index is 30.23 kg/m.  Advanced Directives 10/24/2021 07/14/2021 09/22/2012 09/22/2012  Does Patient Have a Medical Advance Directive? Yes No - Patient does not have advance directive;Patient would not like information  Type of AScientist, forensicPower of AJeneraLiving will - - -  Does patient want to make changes to medical advance directive? No - Patient declined - - -  Copy of H[place] Chart? No - copy requested - - -  Pre-existing out of facility DNR order (yellow form or pink MOST form) - - No No    Current Medications (verified) Outpatient Encounter Medications as of 10/24/2021  Medication Sig   Accu-Chek FastClix Lancets MISC TEST BLOOD SUGAR DAILY   amLODipine (NORVASC) 5 MG tablet Take 1 tablet (5 mg total) by mouth daily.   aspirin 81 MG tablet Take 81 mg by mouth daily.   blood glucose meter kit and supplies KIT Dispense based on patient and insurance preference. Use up to four times daily as directed. (FOR ICD-9 250.00, 250.01).   CALCIUM PO Take 1 tablet by mouth daily. Chewable calcium   carvedilol (COREG) 3.125 MG tablet Take 1 tablet (3.125 mg total) by mouth 2 (two) times daily with a meal.   levothyroxine (SYNTHROID) 75 MCG tablet TAKE 1 TABLET BY MOUTH EVERY MORNING ON AN EMPTY STOMACH   metFORMIN (GLUCOPHAGE) 500 MG tablet TAKE 2 TABLETS TWICE DAILY   nitroGLYCERIN (NITROSTAT) 0.4 MG SL tablet Place 1 tablet (0.4 mg total) under the tongue every 5 (five) minutes x 3 doses as needed for chest  pain.   ONE TOUCH ULTRA TEST test strip    pravastatin (PRAVACHOL) 10 MG tablet Take 1 tablet (10 mg total) by mouth daily.   sitaGLIPtin (JANUVIA) 100 MG tablet Take 1 tablet (100 mg total) by mouth daily.   valsartan-hydrochlorothiazide (DIOVAN-HCT) 160-25 MG tablet Take 1 tablet by mouth daily.   No facility-administered encounter medications on file as of 10/24/2021.    Allergies (verified) Eggs or egg-derived products, Penicillins, and Pollen extract   History: Past Medical History:  Diagnosis Date   Anemia    Arthritis    Diabetes mellitus    Hypercholesteremia 2011?   Hypertension    NSTEMI (non-ST elevated myocardial infarction) ([facility] December 2013   negative cath, normal LV function - felt to be vasospasm   Thyroid disease    Past Surgical History:  Procedure Laterality Date   CARDIAC CATHETERIZATION  December 2013   normal coronaries and normal LV function   FOOT SURGERY     LEFT HEART CATHETERIZATION WITH CORONARY ANGIOGRAM N/A 09/22/2012   Procedure: LEFT HEART CATHETERIZATION WITH CORONARY ANGIOGRAM;  Surgeon: MSherren Mocha MD;  [facility];  Service: Cardiovascular;  Laterality: N/A;   THYROID SURGERY     Partial removed per pt   History reviewed. No pertinent family history. Social History   Socioeconomic History   Marital status: Single    Spouse name: Not on file  Number of children: Not on file   Years of education: Not on file   Highest education level: Bachelor's degree (e.g., BA, AB, BS)  Occupational History   Not on file  Tobacco Use   Smoking status: Former    Types: Cigarettes    Quit date: 11/21/1990    Years since quitting: 30.9   Smokeless tobacco: Never  Vaping Use   Vaping Use: Never used  Substance and Sexual Activity   Alcohol use: No   Drug use: No   Sexual activity: Yes  Other Topics Concern   Not on file  Social History Narrative   Not on file   Social Determinants of Health   Financial Resource Strain: Low  Risk    Difficulty of Paying Living Expenses: Not very hard  Food Insecurity: No Food Insecurity   Worried About Charity fundraiser in the Last Year: Never true   Ran Out of Food in the Last Year: Never true  Transportation Needs: No Transportation Needs   Lack of Transportation (Medical): No   Lack of Transportation (Non-Medical): No  Physical Activity: Insufficiently Active   Days of Exercise per Week: 3 days   Minutes of Exercise per Session: 30 min  Stress: No Stress Concern Present   Feeling of Stress : Only a little  Social Connections: Unknown   Frequency of Communication with Friends and Family: More than three times a week   Frequency of Social Gatherings with Friends and Family: Once a week   Attends Religious Services: More than 4 times per year   Active Member of [employer] or Organizations: Yes   Attends Archivist Meetings: Patient refused   Marital Status: Patient refused    Tobacco Counseling Counseling given: Not Answered   Clinical Intake:  Pre-visit preparation completed: YesNutrition Risk Assessment:  Has the patient had any N/V/D within the last 2 months?  No  Does the patient have any non-healing wounds?  No  Has the patient had any unintentional weight loss or weight gain?  No   Diabetes:  Is the patient diabetic?  Yes  If diabetic, was a CBG obtained today?  Yes  Did the patient bring in their glucometer from home?  No  How often do you monitor your CBG's? Daily.   Financial Strains and Diabetes Management:  Are you having any financial strains with the device, your supplies or your medication? No .  Does the patient want to be seen by Chronic Care Management for management of their diabetes?  No  Would the patient like to be referred to a Nutritionist or for Diabetic Management?  No   Diabetic Exams:  Diabetic Eye Exam: Completed Yes. t.  Diabetic Foot Exam: Completed Yes. Pt has been advised about the importance in completing this  exam. Pt is scheduled for diabetic foot exam on Followed by PCP.    How often do you need to have someone help you when you read instructions, pamphlets, or other written materials from your doctor or pharmacy?: 1 - Never  Diabetic? Yes   Activities of Daily Living In your present state of health, do you have any difficulty performing the following activities: 10/24/2021 10/21/2021  Hearing? N N  Vision? N N  Difficulty concentrating or making decisions? N N  Walking or climbing stairs? N N  Dressing or bathing? N N  Doing errands, shopping? N N  Preparing Food and eating ? N N  Using the Toilet? N N  In the  past six months, have you accidently leaked urine? N Y  Do you have problems with loss of bowel control? N N  Managing your Medications? N N  Managing your Finances? N N  Housekeeping or managing your Housekeeping? N N  Some recent data might be hidden    Patient Care Team: Billie Ruddy, MD as PCP - General (Family Medicine) Burnell Blanks, MD as PCP - Cardiology (Cardiology)  Indicate any recent Medical Services you may have received from other than [facility] providers in the past year (date may be approximate).     Assessment:   This is a routine wellness examination for [place].  Hearing/Vision screen Hearing Screening - Comments:: No difficulty hearing Vision Screening - Comments:: Wears glasses. Followed by [facility]  Dietary issues and exercise activities discussed: Current Exercise Habits: Home exercise routine, Type of exercise: walking, Time (Minutes): 30, Frequency (Times/Week): 3, Weekly Exercise (Minutes/Week): 90, Intensity: Moderate, Exercise limited by: None identified   Goals Addressed             This Visit's Progress    Increase physical activity       Increase physical activity. Lose 10-15 lbs       Depression Screen PHQ 2/9 Scores 10/24/2021 08/13/2021 06/07/2020 06/02/2019 02/01/2013 11/04/2012  PHQ - 2 Score 0 0 1 0 0 2  PHQ- 9  Score - 3 8 - - 3    Fall Risk Fall Risk  10/24/2021 10/21/2021 08/13/2021 08/09/2021 06/27/2021  Falls in the past year? _0 0  Number falls in past yr: 0 0 0 0 -  Injury with Fall? _1 -  Comment Fall result concussion to left side forehead. Followed by PCP - - - -  Risk for fall due to : Impaired balance/gait - - - -    FALL RISK PREVENTION PERTAINING TO THE HOME:  Any stairs in or around the home? Yes  If so, are there any without handrails? No  Home free of loose throw rugs in walkways, pet beds, electrical cords, etc? Yes  Adequate lighting in your home to reduce risk of falls? Yes   ASSISTIVE DEVICES UTILIZED TO PREVENT FALLS:  Life alert? No  Use of a cane, walker or w/c? No  Grab bars in the bathroom? Yes  Shower chair or bench in shower? Yes  Elevated toilet seat or a handicapped toilet? No   TIMED UP AND GO:  Was the test performed? Yes .  Length of time to ambulate 10 feet: 5 sec.   Gait steady and fast without use of assistive device  Cognitive Function:     6CIT Screen 10/24/2021  What Year? 0 points  What month? 0 points  What time? 0 points  Count back from 20 0 points  Months in reverse 0 points  Repeat phrase 0 points  Total Score 0    Immunizations Immunization History  Administered Date(s) Administered   Influenza Inj Mdck Quad Pf 06/27/2021   MODERNA COVID-19 SARS-COV-2 PEDS BIVALENT BOOSTER 6Y-11Y 08/03/2021   Moderna Sars-Covid-2 Vaccination 01/22/2020, 03/02/2020, 08/28/2020, 08/03/2021   Tdap 12/05/2013    TDAP status: Up to date  Flu Vaccine status: Up to date  Pneumococcal vaccine status: Due, Education has been provided regarding the importance of this vaccine. Advised may receive this vaccine at local pharmacy or Health Dept. Aware to provide a copy of the vaccination record if obtained from local pharmacy or Health Dept. Verbalized acceptance and understanding.  Covid-19 vaccine status: Completed vaccines  Qualifies for  Shingles Vaccine? Yes   Zostavax completed No   Shingrix Completed?: No.    Education has been provided regarding the importance of this vaccine. Patient has been advised to call insurance company to determine out of pocket expense if they have not yet received this vaccine. Advised may also receive vaccine at local pharmacy or Health Dept. Verbalized acceptance and understanding.  Screening Tests Health Maintenance  Topic Date Due   DEXA SCAN  Never done   COVID-19 Vaccine (5 - Booster for Moderna series) 09/28/2021   Zoster Vaccines- Shingrix (1 of 2) 01/21/2022 (Originally 07/30/2005)   Pneumonia Vaccine 22+ Years old (1 - PCV) 10/24/2022 (Originally 07/30/1962)   HEMOGLOBIN A1C  12/26/2021   FOOT EXAM  08/27/2022   OPHTHALMOLOGY EXAM  10/01/2022   MAMMOGRAM  08/09/2023   TETANUS/TDAP  12/06/2023   COLONOSCOPY (Pts 45-58yr Insurance coverage will need to be confirmed)  04/16/2027   INFLUENZA VACCINE  Completed   Hepatitis C Screening  Completed   HPV VACCINES  Aged Out    Health Maintenance  Health Maintenance Due  Topic Date Due   DEXA SCAN  Never done   COVID-19 Vaccine (5 - Booster for Moderna series) 09/28/2021   Mammogram status: Completed Yes. Repeat every year  Bone Density status: Ordered Yes. Pt provided with contact info and advised to call to schedule appt.   Hepatitis C Screening: does qualify; Completed 12/01/19  Vision Screening: Recommended annual ophthalmology exams for early detection of glaucoma and other disorders of the eye. Is the patient up to date with their annual eye exam?  Yes  Who is the provider or what is the name of the office in which the patient attends annual [facility] exams[facility] If pt is not established with a provider, would they like to be referred to a provider to establish care? No .   Dental Screening: Recommended annual dental exams for proper oral hygiene  Community Resource Referral / Chronic Care Management: CRR required this  visit?  No   CCM required this visit?  No      Plan:     I have personally reviewed and noted the following in the patients chart:   Medical and social history Use of alcohol, tobacco or illicit drugs  Current medications and supplements including opioid prescriptions.  Functional ability and status Nutritional status Physical activity Advanced directives List of other physicians Hospitalizations, surgeries, and ER visits in previous 12 months Vitals Screenings to include cognitive, depression, and falls Referrals and appointments  In addition, I have reviewed and discussed with patient certain preventive protocols, quality metrics, and best practice recommendations. A written personalized care plan for preventive services as well as general preventive health recommendations were provided to patient.     BCriselda Peaches LPN   24/05/6758  Nurse Notes: Dexa Scan ordered 10/24/21

## 2021-11-02 ENCOUNTER — Telehealth: Payer: Self-pay

## 2021-11-02 DIAGNOSIS — E785 Hyperlipidemia, unspecified: Secondary | ICD-10-CM

## 2021-11-02 DIAGNOSIS — E1169 Type 2 diabetes mellitus with other specified complication: Secondary | ICD-10-CM

## 2021-11-02 MED ORDER — ACCU-CHEK GUIDE W/DEVICE KIT: 1.0000 | PACK | Freq: Every day | 0 refills | Status: DC

## 2021-11-02 NOTE — Telephone Encounter (Signed)
Refill request received from [facility] on 10/08/21.

## 2021-11-12 ENCOUNTER — Telehealth: Payer: Self-pay | Admitting: Family Medicine

## 2021-11-12 NOTE — Telephone Encounter (Signed)
Jeanne [name] with [facility] stated that they received the kit for the glucose but they didn't receive the test strips and lancets.   [name] could be contacted at [phone].  Please advise.

## 2021-11-14 MED ORDER — ACCU-CHEK FASTCLIX LANCETS MISC: 5 refills | Status: AC

## 2021-11-14 MED ORDER — GLUCOSE BLOOD VI STRP: ORAL_STRIP | 12 refills | Status: DC

## 2021-11-14 NOTE — Telephone Encounter (Signed)
Test strips and lancets sent in.

## 2022-01-29 ENCOUNTER — Encounter: Payer: Self-pay | Admitting: Family Medicine

## 2022-01-30 DIAGNOSIS — H401131 Primary open-angle glaucoma, bilateral, mild stage: Secondary | ICD-10-CM | POA: Diagnosis not present

## 2022-02-07 ENCOUNTER — Ambulatory Visit (INDEPENDENT_AMBULATORY_CARE_PROVIDER_SITE_OTHER): Payer: Medicare HMO | Admitting: Family Medicine

## 2022-02-07 ENCOUNTER — Encounter: Payer: Self-pay | Admitting: Family Medicine

## 2022-02-07 VITALS — BP 122/80 | HR 80

## 2022-02-07 DIAGNOSIS — R234 Changes in skin texture: Secondary | ICD-10-CM

## 2022-02-07 DIAGNOSIS — E1169 Type 2 diabetes mellitus with other specified complication: Secondary | ICD-10-CM

## 2022-02-07 DIAGNOSIS — E785 Hyperlipidemia, unspecified: Secondary | ICD-10-CM | POA: Diagnosis not present

## 2022-02-07 LAB — POCT GLYCOSYLATED HEMOGLOBIN (HGB A1C): Hemoglobin A1C: 7 % — AB (ref 4.0–5.6)

## 2022-02-07 MED ORDER — TRIAMCINOLONE ACETONIDE 0.025 % EX OINT: 1.0000 "application " | TOPICAL_OINTMENT | Freq: Two times a day (BID) | CUTANEOUS | 0 refills | Status: DC

## 2022-02-07 NOTE — Patient Instructions (Signed)
Your hemoglobin A1c was 7.0% this visit.  Try checking your blood sugar this next week prior to bed and in a.m. to see if it is elevated.  If it continues to remain elevated we will need to add additional medication.  You can call the clinic to let us know your readings.

## 2022-02-07 NOTE — Progress Notes (Signed)
Subjective:    Patient ID: Jeanne [name], female    DOB: [birth year], 66 y.o.   MRN: [id number]  Chief Complaint  Patient presents with   peeling    Hands are peeling.     HPI Patient was seen today for acute concern.  Pt with peeling of b/l hands.  Had a similar episode in 2021.   Since then pt using gloves when washing dishes and using chemicals to clean.  Pt denies itching, pain, erythema, edema of hands.  Pt uses a variety of lotions.  Denies peeling elsewhere.  Also drinking half a collagen/protein shake daily x yrs.    BS has been elevated 150s-180s in am.  Pt eating dinner early and not snacking afterwards.  Still exercising and drinking mostly water.  Past Medical History:  Diagnosis Date   Anemia    Arthritis    Diabetes mellitus    Hypercholesteremia 2011?   Hypertension    NSTEMI (non-ST elevated myocardial infarction) [facility]) December 2013   negative cath, normal LV function - felt to be vasospasm   Thyroid disease     Allergies  Allergen Reactions   Eggs Or Egg-Derived Products Other (See Comments)    Unknown reaction-patient states she was allergic to egg yolks when she was little. She can eat eggs now, but does not take flu shot or other egg-containing medications.   Penicillins Other (See Comments)    Unknown-reaction as a child    Pollen Extract     ROS General: Denies fever, chills, night sweats, changes in weight, changes in appetite HEENT: Denies headaches, ear pain, changes in vision, rhinorrhea, sore throat CV: Denies CP, palpitations, SOB, orthopnea Pulm: Denies SOB, cough, wheezing GI: Denies abdominal pain, nausea, vomiting, diarrhea, constipation GU: Denies dysuria, hematuria, frequency, vaginal discharge Msk: Denies muscle cramps, joint pains Neuro: Denies weakness, numbness, tingling Skin: Denies rashes, bruising  +peeling skin of hands Psych: Denies depression, anxiety, hallucinations     Objective:    Blood pressure 122/80, pulse  80, SpO2 98 %.  Gen. Pleasant, well-nourished, in no distress, normal affect   HEENT: [place]/AT, face symmetric, conjunctiva clear, no scleral icterus, PERRLA, EOMI, nares patent without drainage Lungs: no accessory muscle use Cardiovascular: RRR, no peripheral edema Musculoskeletal: No deformities, no cyanosis or clubbing, normal tone Neuro:  A&Ox3, CN II-XII intact, normal gait Skin:  Warm, dry, intact.  Skin of bilateral hands peeling.  Wt Readings from Last 3 Encounters:  10/24/21 187 lb 4.8 oz (85 kg)  08/27/21 186 lb 3.2 oz (84.5 kg)  08/20/21 187 lb 3.2 oz (84.9 kg)    Lab Results  Component Value Date   WBC 5.4 06/27/2021   HGB 12.9 06/27/2021   HCT 39.5 06/27/2021   PLT 307.0 06/27/2021   GLUCOSE 124 (H) 06/27/2021   CHOL 145 06/27/2021   TRIG 85.0 06/27/2021   HDL 46.20 06/27/2021   LDLCALC 82 06/27/2021   ALT 21 06/07/2020   AST 12 06/07/2020   NA 138 06/27/2021   K 3.9 06/27/2021   CL 99 06/27/2021   CREATININE 0.78 06/27/2021   BUN 15 06/27/2021   CO2 28 06/27/2021   TSH 1.22 06/27/2021   INR 1.06 09/22/2012   HGBA1C 7.5 (H) 06/27/2021   MICROALBUR 1.1 12/01/2019    Assessment/Plan:  Localized skin desquamation  -Discussed possible causes including allergy to medication, product, food.  Also consider conditions such as psoriasis though nonpruritic or painful. -Pt to keep food diary. -Triamcinolone ointment twice daily times  the next week. -For continued or worsening symptoms dermatology referral and allergy testing. - Plan: triamcinolone (KENALOG) 0.025 % ointment  Type 2 diabetes mellitus with hyperlipidemia (HCC)  -Hemoglobin A1c 7.0% this visit -Continue current medications metformin 1000 mg twice daily, Januvia 100 mg daily -Continue lifestyle modifications -We will have patient check blood sugar prior to bed to see if it is elevated as well as first thing in the morning.  Patient to notify clinic of readings.  For continued elevation in blood sugar  we will add additional medication - Plan: POC HgB A1c  F/u in 1 month.  Abbe Amsterdam, MD

## 2022-02-19 ENCOUNTER — Telehealth: Payer: Self-pay | Admitting: Family Medicine

## 2022-02-19 NOTE — Telephone Encounter (Signed)
Patient states that Provider request for her to measure her glucose levels.. Patient is calling in with the numbers and request the [place] calls back [phone]

## 2022-03-11 ENCOUNTER — Ambulatory Visit: Payer: Medicare HMO | Admitting: Family Medicine

## 2022-03-11 ENCOUNTER — Ambulatory Visit (INDEPENDENT_AMBULATORY_CARE_PROVIDER_SITE_OTHER): Payer: Medicare HMO | Admitting: Family Medicine

## 2022-03-11 ENCOUNTER — Encounter: Payer: Self-pay | Admitting: Family Medicine

## 2022-03-11 VITALS — BP 124/74 | HR 88 | Temp 99.2°F | Wt 185.0 lb

## 2022-03-11 DIAGNOSIS — I10 Essential (primary) hypertension: Secondary | ICD-10-CM | POA: Diagnosis not present

## 2022-03-11 DIAGNOSIS — E1169 Type 2 diabetes mellitus with other specified complication: Secondary | ICD-10-CM | POA: Diagnosis not present

## 2022-03-11 DIAGNOSIS — E785 Hyperlipidemia, unspecified: Secondary | ICD-10-CM

## 2022-03-11 NOTE — Progress Notes (Signed)
Subjective:    Patient ID: Jeanne [name], female    DOB: 11/21/1955, 66 y.o.   MRN: [id number]  Chief Complaint  Patient presents with   Follow-up    DM -blood sugars still not running normal.  Pt accompanied by her daughter  HPI Patient was seen today for f/u on DM.  Pt states bs has been elevated 120s-150s in am.  Pt has not changed diet.  Doing water aerobics regularly.  Concerned meds are no longer working.  Saw a nutritionist 10 yrs ago.   Was told she could eat a whole baked potato or have a small portion of ice cream if she ate better during the wk.  Pt states she will have dark chocolate most days.  May eat peanut butter crackers.  Past Medical History:  Diagnosis Date   Anemia    Arthritis    Diabetes mellitus    Hypercholesteremia 2011?   Hypertension    NSTEMI (non-ST elevated myocardial infarction) [facility]) December 2013   negative cath, normal LV function - felt to be vasospasm   Thyroid disease     Allergies  Allergen Reactions   Eggs Or Egg-Derived Products Other (See Comments)    Unknown reaction-patient states she was allergic to egg yolks when she was little. She can eat eggs now, but does not take flu shot or other egg-containing medications.   Penicillins Other (See Comments)    Unknown-reaction as a child    Pollen Extract     ROS General: Denies fever, chills, night sweats, changes in weight, changes in appetite  HEENT: Denies headaches, ear pain, changes in vision, rhinorrhea, sore throat CV: Denies CP, palpitations, SOB, orthopnea Pulm: Denies SOB, cough, wheezing GI: Denies abdominal pain, nausea, vomiting, diarrhea, constipation GU: Denies dysuria, hematuria, frequency, vaginal discharge Msk: Denies muscle cramps, joint pains Neuro: Denies weakness, numbness, tingling Skin: Denies rashes, bruising Psych: Denies depression, anxiety, hallucinations    Objective:    Blood pressure 124/74, pulse 88, temperature 99.2 F (37.3 C), temperature  source Oral, weight 185 lb (83.9 kg), SpO2 98 %.  Gen. Pleasant, well-nourished, in no distress, normal affect   HEENT: [place]/AT, face symmetric, conjunctiva clear, no scleral icterus, PERRLA, EOMI, nares patent without drainage Lungs: no accessory muscle use, CTAB, no wheezes or rales Cardiovascular: RRR, no m/r/g, no peripheral edema Neuro:  A&Ox3, CN II-XII intact, normal gait Skin:  Warm, no lesions/ rash   Wt Readings from Last 3 Encounters:  10/24/21 187 lb 4.8 oz (85 kg)  08/27/21 186 lb 3.2 oz (84.5 kg)  08/20/21 187 lb 3.2 oz (84.9 kg)    Lab Results  Component Value Date   WBC 5.4 06/27/2021   HGB 12.9 06/27/2021   HCT 39.5 06/27/2021   PLT 307.0 06/27/2021   GLUCOSE 124 (H) 06/27/2021   CHOL 145 06/27/2021   TRIG 85.0 06/27/2021   HDL 46.20 06/27/2021   LDLCALC 82 06/27/2021   ALT 21 06/07/2020   AST 12 06/07/2020   NA 138 06/27/2021   K 3.9 06/27/2021   CL 99 06/27/2021   CREATININE 0.78 06/27/2021   BUN 15 06/27/2021   CO2 28 06/27/2021   TSH 1.22 06/27/2021   INR 1.06 09/22/2012   HGBA1C 7.0 (A) 02/07/2022   MICROALBUR 1.1 12/01/2019    Assessment/Plan:  Type 2 diabetes mellitus with hyperlipidemia (HCC)  -Mild elevation in at home fasting blood sugar readings -Discussed lifestyle modifications.  Discussed referral to nutritionist to revisit nutritional basics regarding diabetes.  Discussed reading labels, decreasing carbs -Continue current medications including Januvia 100 mg, Forman 1000 mg twice daily -For continued elevation consider starting Farxiga -Continue ARB and statin - Plan: Ambulatory referral to diabetic education  Essential hypertension -Controlled -Continue valsartan-hydrochlorothiazide 160-25 mg daily, Coreg 3.125 mg twice daily -Continue lifestyle modifications  F/u in 1 month, sooner if needed  Abbe Amsterdam, MD

## 2022-03-27 ENCOUNTER — Encounter: Payer: Medicare HMO | Attending: Endocrinology | Admitting: Nutrition

## 2022-03-29 NOTE — Progress Notes (Unsigned)
Patient is here today to review her diet.  Says went to a dietitian a few years ago, but wants a review History of gestional diabetes with her 2 children Diabetes Medications:  Metformin and Januvia Exercise:  does water aerobics at 9:45AM 3X/wk.  SBGM:  meter:  tests 1-2X.day:  FBSs: 123-135 fasting, and acS or 2-3 hrs pc supper-variable  Diet:  Typical day: 6-8:30 AM: water or tea with Splenda

## 2022-04-15 ENCOUNTER — Telehealth: Payer: Self-pay

## 2022-04-15 ENCOUNTER — Ambulatory Visit: Payer: Medicare HMO | Admitting: Family Medicine

## 2022-04-16 NOTE — Telephone Encounter (Signed)
Encounter LOS has been submitted. 

## 2022-04-16 NOTE — Addendum Note (Signed)
Addended by: Tillie Rung on: 04/16/2022 08:22 AM   Modules accepted: Orders, Level of Service

## 2022-04-18 ENCOUNTER — Ambulatory Visit (INDEPENDENT_AMBULATORY_CARE_PROVIDER_SITE_OTHER): Payer: Medicare HMO | Admitting: Family Medicine

## 2022-04-18 VITALS — BP 128/74 | HR 83 | Temp 98.1°F | Wt 183.8 lb

## 2022-04-18 DIAGNOSIS — I10 Essential (primary) hypertension: Secondary | ICD-10-CM

## 2022-04-18 DIAGNOSIS — E119 Type 2 diabetes mellitus without complications: Secondary | ICD-10-CM

## 2022-04-18 NOTE — Progress Notes (Signed)
Subjective:    Patient ID: Jeanne [name], female    DOB: 10/01/1955, 66 y.o.   MRN: [id number]  Chief Complaint  Patient presents with   Follow-up   Diabetes   Hypertension    HPI Patient was seen today for f/u.  Pt doing well.  BP controlled on Norvasc 5 mg daily, valsartan-HCTZ 160/25 mg, and Coreg 3.125 mg BID.  Pt had appt with nutritionist. Pt states the nutritionist seemed confused as to why she was there.  Pt previously seen for concerns bs was increasing 120s-150s. BS better.  Hgb A1c 7% on 02/07/22.  Taking metformin 1000 mg twice daily, Januvia 100 mg daily.  Past Medical History:  Diagnosis Date   Anemia    Arthritis    Diabetes mellitus    Hypercholesteremia 2011?   Hypertension    NSTEMI (non-ST elevated myocardial infarction) [facility]) December 2013   negative cath, normal LV function - felt to be vasospasm   Thyroid disease     Allergies  Allergen Reactions   Eggs Or Egg-Derived Products Other (See Comments)    Unknown reaction-patient states she was allergic to egg yolks when she was little. She can eat eggs now, but does not take flu shot or other egg-containing medications.   Penicillins Other (See Comments)    Unknown-reaction as a child    Pollen Extract     ROS General: Denies fever, chills, night sweats, changes in weight, changes in appetite HEENT: Denies headaches, ear pain, changes in vision, rhinorrhea, sore throat CV: Denies CP, palpitations, SOB, orthopnea Pulm: Denies SOB, cough, wheezing GI: Denies abdominal pain, nausea, vomiting, diarrhea, constipation GU: Denies dysuria, hematuria, frequency, vaginal discharge Msk: Denies muscle cramps, joint pains Neuro: Denies weakness, numbness, tingling Skin: Denies rashes, bruising Psych: Denies depression, anxiety, hallucinations    Objective:    Blood pressure 128/74, pulse 83, temperature 98.1 F (36.7 C), temperature source Oral, weight 183 lb 12.8 oz (83.4 kg), SpO2 97 %.  Gen. Pleasant,  well-nourished, in no distress, normal affect   HEENT: [place]/AT, face symmetric, conjunctiva clear, no scleral icterus, PERRLA, EOMI, nares patent without drainage Lungs: no accessory muscle use, CTAB, no wheezes or rales Cardiovascular: RRR, no m/r/g, no peripheral edema Neuro:  A&Ox3, CN II-XII intact, normal gait Skin:  Warm, no lesions/ rash   Wt Readings from Last 3 Encounters:  04/18/22 183 lb 12.8 oz (83.4 kg)  03/29/22 184 lb 3.2 oz (83.6 kg)  03/11/22 185 lb (83.9 kg)    Lab Results  Component Value Date   WBC 5.4 06/27/2021   HGB 12.9 06/27/2021   HCT 39.5 06/27/2021   PLT 307.0 06/27/2021   GLUCOSE 124 (H) 06/27/2021   CHOL 145 06/27/2021   TRIG 85.0 06/27/2021   HDL 46.20 06/27/2021   LDLCALC 82 06/27/2021   ALT 21 06/07/2020   AST 12 06/07/2020   NA 138 06/27/2021   K 3.9 06/27/2021   CL 99 06/27/2021   CREATININE 0.78 06/27/2021   BUN 15 06/27/2021   CO2 28 06/27/2021   TSH 1.22 06/27/2021   INR 1.06 09/22/2012   HGBA1C 7.0 (A) 02/07/2022   MICROALBUR 1.1 12/01/2019    Assessment/Plan:  Essential hypertension -controlled. -continue current medications including valsartan-HCTZ 160-25 mg daily, norvasc 5 mg daily -lifestyle modifications  Controlled type 2 diabetes mellitus without complication, without long-term current use of insulin (HCC) -hgb A1C 7.0% on 02/07/22 -continue current meds including Metformin 1000 mg BID and Januvia 100 mg daily -Continue lifestyle modifications -  on ARB and statin -eye exam done 10/01/21  F/u 4-6 months, sooner if needed  Abbe Amsterdam, MD

## 2022-04-30 ENCOUNTER — Encounter: Payer: Self-pay | Admitting: Family Medicine

## 2022-05-10 ENCOUNTER — Telehealth: Payer: Self-pay | Admitting: Family Medicine

## 2022-05-10 DIAGNOSIS — R234 Changes in skin texture: Secondary | ICD-10-CM

## 2022-05-10 NOTE — Telephone Encounter (Signed)
Pt called to say her hands are starting to peel again and is wondering if she should see a dermatologist?  Pt is still using topical cream.  Please advise. [phone]

## 2022-05-13 NOTE — Telephone Encounter (Signed)
Referral to dermatology placed.  

## 2022-05-15 ENCOUNTER — Telehealth: Payer: Self-pay | Admitting: Family Medicine

## 2022-05-15 NOTE — Telephone Encounter (Signed)
ATC pt, no answer and no VM set up. Referral for derm was sent to [place] [facility].

## 2022-05-15 NOTE — Telephone Encounter (Signed)
Pt called stating she needs more clarification on her referral. She states they are telling her its for surgery?  She is very confused.  Pt would like a call back for clarification. [phone]

## 2022-06-04 ENCOUNTER — Telehealth: Payer: Self-pay | Admitting: Family Medicine

## 2022-06-04 NOTE — Telephone Encounter (Signed)
Pt states sitaGLIPtin (JANUVIA) 100 MG tablet is too expensive and seeking an alternative or samples if available

## 2022-06-05 DIAGNOSIS — H401131 Primary open-angle glaucoma, bilateral, mild stage: Secondary | ICD-10-CM | POA: Diagnosis not present

## 2022-06-05 NOTE — Telephone Encounter (Signed)
Would be helpful to have additional info as pt previously on this med without issue.  Did her insurance change, was she in an assistance program, how much is too expensive, etc.

## 2022-06-11 NOTE — Telephone Encounter (Signed)
Pt call and stated the Insurance didn't change but the medication went up and her Insurance went up to, the patient stated the medication cost is 178 .00 for 1 mo.

## 2022-06-20 ENCOUNTER — Encounter: Payer: Self-pay | Admitting: Family Medicine

## 2022-06-20 NOTE — Telephone Encounter (Signed)
Pt called to say she is still waiting for a call back to discuss her medication options.   Pt stated she is completely out of this medication and needs a call back asap.  [phone]

## 2022-06-20 NOTE — Telephone Encounter (Signed)
Spoke with pt, states nothing has changed with insurance, and was not in any assistance programs, she is in the donut hole and the medication cost more than it did last year. Asked her if she could call ins, and see if they have any alternatives to the medication that she could take that would be less expensive, so pcp may be able to send in something if possible. Pt will call insurance and call office back to let us know if there are any alternatives.

## 2022-06-21 ENCOUNTER — Telehealth: Payer: Self-pay | Admitting: Family Medicine

## 2022-06-21 NOTE — Telephone Encounter (Signed)
Pt called to inform MD that pharmacy listed below suggested 2 alternatives, in place of the Januvia: [place], [place] Phone:  [phone]  Fax:  [phone]     1) Glipizide 2.5, 5 mg, 10 mg 2) Pioglitazone 15, 30, 45mg 

## 2022-06-21 NOTE — Telephone Encounter (Signed)
Information was sent to PCP by pt message.

## 2022-06-24 ENCOUNTER — Other Ambulatory Visit: Payer: Self-pay | Admitting: Family Medicine

## 2022-06-24 DIAGNOSIS — E1169 Type 2 diabetes mellitus with other specified complication: Secondary | ICD-10-CM

## 2022-06-24 MED ORDER — GLIPIZIDE 5 MG PO TABS: 5.0000 mg | ORAL_TABLET | Freq: Two times a day (BID) | ORAL | 3 refills | Status: DC

## 2022-06-24 NOTE — Progress Notes (Signed)
Januvia 100 mg daily discontinued 2/2 cost as patient is currently in the "donut hole" with insurance.  A prescription for glipizide 5 mg twice daily sent to pharmacy.  Patient to continue metformin 1000 mg twice daily.  Grier Mitts, MD

## 2022-06-26 NOTE — Telephone Encounter (Signed)
PCP sent in Glipizide 5 mg to pharmacy 06/24/22.

## 2022-06-28 ENCOUNTER — Encounter: Payer: Medicare HMO | Admitting: Family Medicine

## 2022-07-05 ENCOUNTER — Encounter: Payer: Medicare HMO | Admitting: Family Medicine

## 2022-07-15 ENCOUNTER — Other Ambulatory Visit: Payer: Self-pay | Admitting: Family Medicine

## 2022-07-15 DIAGNOSIS — E782 Mixed hyperlipidemia: Secondary | ICD-10-CM

## 2022-07-17 DIAGNOSIS — Z0100 Encounter for examination of eyes and vision without abnormal findings: Secondary | ICD-10-CM | POA: Diagnosis not present

## 2022-08-08 NOTE — Progress Notes (Signed)
Cardiology Office Note:    Date:  08/21/2022   ID:  Jeanne [name], DOB [birth year], MRN [id number]  PCP:  Billie Ruddy, MD  [facility] Cardiologist:  Lauree Chandler, MD     Referring MD: Billie Ruddy, MD   Chief Complaint:  Follow-up     History of Present Illness:   Jeanne [name] is a 66 y.o. female with a hx of possible coronary vasospasm, hypertension, RBBB, hyperlipidemia and diabetes mellitus presents for yearly follow-up   Cardiac catheterization December 2013 showed normal coronaries in setting of non-STEMI.  It was felt she represent coronary vasospasm versus myocarditis.  Her EF was normal.  Patient was last seen in our office 07/2022 after a mechanical fall otherwise was stable from C-V standpoint.  Patient comes in for yearly f/u. Does water aerobics and walks 3 times a week. Denies chest pain, dyspnea, palpitations, edema. She was upset with her recent blood work as her A1C 7.4 and LDL up 102. She is working hard on her diet.       Past Medical History:  Diagnosis Date   Anemia    Arthritis    Diabetes mellitus    Hypercholesteremia 2011?   Hypertension    NSTEMI (non-ST elevated myocardial infarction) [facility]) December 2013   negative cath, normal LV function - felt to be vasospasm   Thyroid disease    Current Medications: Current Meds  Medication Sig   Accu-Chek FastClix Lancets MISC TEST BLOOD SUGAR DAILY   amLODipine (NORVASC) 5 MG tablet TAKE 1 TABLET EVERY DAY   aspirin 81 MG tablet Take 81 mg by mouth daily.   blood glucose meter kit and supplies KIT Dispense based on patient and insurance preference. Use up to four times daily as directed. (FOR ICD-9 250.00, 250.01).   Blood Glucose Monitoring Suppl (ACCU-CHEK GUIDE) w/Device KIT 1 kit by Does not apply route daily.   CALCIUM PO Take 1 tablet by mouth daily. Chewable calcium   carvedilol (COREG) 3.125 MG tablet TAKE 1 TABLET TWICE DAILY WITH MEALS    glipiZIDE (GLUCOTROL) 5 MG tablet Take 1 tablet (5 mg total) by mouth 2 (two) times daily before a meal.   glucose blood test strip Use as instructed   levothyroxine (SYNTHROID) 75 MCG tablet TAKE 1 TABLET EVERY MORNING ON AN EMPTY STOMACH   metFORMIN (GLUCOPHAGE) 500 MG tablet TAKE 2 TABLETS TWICE DAILY   nitroGLYCERIN (NITROSTAT) 0.4 MG SL tablet Place 1 tablet (0.4 mg total) under the tongue every 5 (five) minutes x 3 doses as needed for chest pain.   pravastatin (PRAVACHOL) 10 MG tablet TAKE 1 TABLET EVERY DAY   triamcinolone (KENALOG) 0.025 % ointment Apply 1 application. topically 2 (two) times daily.   valsartan-hydrochlorothiazide (DIOVAN-HCT) 160-25 MG tablet TAKE 1 TABLET EVERY DAY    Allergies:   Eggs or egg-derived products, Penicillins, and Pollen extract   Social History   Tobacco Use   Smoking status: Former    Types: Cigarettes    Quit date: 11/21/1990    Years since quitting: 31.7   Smokeless tobacco: Never  Vaping Use   Vaping Use: Never used  Substance Use Topics   Alcohol use: No   Drug use: No    Family Hx: The patient's family history is not on file.  ROS     Physical Exam:    VS:  BP 124/72   Pulse 86   Ht 5' 5.25" (1.657 m)   Wt 181 lb (  82.1 kg)   SpO2 99%   BMI 29.89 kg/m     Wt Readings from Last 3 Encounters:  08/21/22 181 lb (82.1 kg)  08/12/22 179 lb 12.8 oz (81.6 kg)  04/18/22 183 lb 12.8 oz (83.4 kg)    Physical Exam  GEN: Well nourished, well developed, in no acute distress  Neck: no JVD, carotid bruits, or masses Cardiac:RRR; no murmurs, rubs, or gallops  Respiratory:  clear to auscultation bilaterally, normal work of breathing GI: soft, nontender, nondistended, +  Ext: without cyanosis, clubbing, or edema, Good distal pulses bilaterally Neuro:  Alert and Oriented x 3,  Psych: euthymic mood, full affect        EKGs/Labs/Other Test Reviewed:    EKG:  EKG is   ordered today.  The ekg ordered today demonstrates NSR with RBBB,  inf Q's   Recent Labs: 08/12/2022: ALT 14; BUN 13; Creatinine, Ser 0.73; Hemoglobin 12.9; Platelets 342.0; Potassium 3.6; Sodium 140; TSH 0.49   Recent Lipid Panel Recent Labs    08/12/22 1349  CHOL 167  TRIG 81.0  HDL 49.60  VLDL 16.2  LDLCALC 102*     Prior CV Studies:       Risk Assessment/Calculations/Metrics:              ASSESSMENT & PLAN:   No problem-specific Assessment & Plan notes found for this encounter.   History of possible coronary vasospasm in 2013 -Cardiac cath at that time showed normal coronary. No anginal symptoms. Continue current exercise regimen.  -Continue ASA, BB and statin    Hypertension - BP stable and controlled on current medications     Hyperlipidemia  - 07/2022: Cholesterol 167; HDL 49; LDL 102; Triglycerides 81.0; - Managed by PCP - Continue statin     DM - Per PCP - Most recent HgbA1c 7.4 07/2022 -couldn't afford Januvia in the donut hole and now on glipizide. Saw a dietician and didn't seem to give her any new information.           Dispo:  No follow-ups on file.   Medication Adjustments/Labs and Tests Ordered: Current medicines are reviewed at length with the patient today.  Concerns regarding medicines are outlined above.  Tests Ordered: Orders Placed This Encounter  Procedures   EKG 12-Lead   Medication Changes: No orders of the defined types were placed in this encounter.  Sumner Boast, PA-C  08/21/2022 12:08 PM    [address], [place], [place]  [address] Phone: [phone]; Fax: [phone]

## 2022-08-12 ENCOUNTER — Ambulatory Visit (INDEPENDENT_AMBULATORY_CARE_PROVIDER_SITE_OTHER): Payer: Medicare HMO | Admitting: Family Medicine

## 2022-08-12 VITALS — BP 136/82 | HR 75 | Temp 98.7°F | Ht 65.25 in | Wt 179.8 lb

## 2022-08-12 DIAGNOSIS — Z780 Asymptomatic menopausal state: Secondary | ICD-10-CM

## 2022-08-12 DIAGNOSIS — E890 Postprocedural hypothyroidism: Secondary | ICD-10-CM | POA: Diagnosis not present

## 2022-08-12 DIAGNOSIS — E1169 Type 2 diabetes mellitus with other specified complication: Secondary | ICD-10-CM

## 2022-08-12 DIAGNOSIS — Z0000 Encounter for general adult medical examination without abnormal findings: Secondary | ICD-10-CM | POA: Diagnosis not present

## 2022-08-12 DIAGNOSIS — E785 Hyperlipidemia, unspecified: Secondary | ICD-10-CM | POA: Diagnosis not present

## 2022-08-12 LAB — CBC WITH DIFFERENTIAL/PLATELET
Basophils Absolute: 0 10*3/uL (ref 0.0–0.1)
Basophils Relative: 0.3 % (ref 0.0–3.0)
Eosinophils Absolute: 0.1 10*3/uL (ref 0.0–0.7)
Eosinophils Relative: 1.6 % (ref 0.0–5.0)
HCT: 38.7 % (ref 36.0–46.0)
Hemoglobin: 12.9 g/dL (ref 12.0–15.0)
Lymphocytes Relative: 42 % (ref 12.0–46.0)
Lymphs Abs: 2.3 10*3/uL (ref 0.7–4.0)
MCHC: 33.3 g/dL (ref 30.0–36.0)
MCV: 88.1 fl (ref 78.0–100.0)
Monocytes Absolute: 0.5 10*3/uL (ref 0.1–1.0)
Monocytes Relative: 8.2 % (ref 3.0–12.0)
Neutro Abs: 2.7 10*3/uL (ref 1.4–7.7)
Neutrophils Relative %: 47.9 % (ref 43.0–77.0)
Platelets: 342 10*3/uL (ref 150.0–400.0)
RBC: 4.39 Mil/uL (ref 3.87–5.11)
RDW: 14.8 % (ref 11.5–15.5)
WBC: 5.6 10*3/uL (ref 4.0–10.5)

## 2022-08-12 LAB — HEMOGLOBIN A1C: Hgb A1c MFr Bld: 7.4 % — ABNORMAL HIGH (ref 4.6–6.5)

## 2022-08-12 LAB — TSH: TSH: 0.49 u[IU]/mL (ref 0.35–5.50)

## 2022-08-12 NOTE — Progress Notes (Signed)
Subjective:     Jeanne [name] is a 66 y.o. female and is here for a comprehensive physical exam. The patient reports bs typically 121-140.  Highest reading was 164 and lowest 107.  Patient has made changes to diet and exercise.  Mammogram this month.  Had COVID-vaccine at local pharmacy.  Social History   Socioeconomic History   Marital status: Single    Spouse name: Not on file   Number of children: Not on file   Years of education: Not on file   Highest education level: Bachelor's degree (e.g., BA, AB, BS)  Occupational History   Not on file  Tobacco Use   Smoking status: Former    Types: Cigarettes    Quit date: 11/21/1990    Years since quitting: 31.7   Smokeless tobacco: Never  Vaping Use   Vaping Use: Never used  Substance and Sexual Activity   Alcohol use: No   Drug use: No   Sexual activity: Yes  Other Topics Concern   Not on file  Social History Narrative   Not on file   Social Determinants of Health   Financial Resource Strain: Low Risk  (10/24/2021)   Overall Financial Resource Strain (CARDIA)    Difficulty of Paying Living Expenses: Not very hard  Recent Concern: Financial Resource Strain - Medium Risk (08/09/2021)   Overall Financial Resource Strain (CARDIA)    Difficulty of Paying Living Expenses: Somewhat hard  Food Insecurity: No Food Insecurity (10/24/2021)   Hunger Vital Sign    Worried About Running Out of Food in the Last Year: Never true    Ran Out of Food in the Last Year: Never true  Transportation Needs: No Transportation Needs (10/24/2021)   PRAPARE - Administrator, Civil Service (Medical): No    Lack of Transportation (Non-Medical): No  Physical Activity: Insufficiently Active (10/24/2021)   Exercise Vital Sign    Days of Exercise per Week: 3 days    Minutes of Exercise per Session: 30 min  Stress: No Stress Concern Present (10/24/2021)   [employer] of Occupational Health - Occupational Stress Questionnaire    Feeling of  Stress : Only a little  Social Connections: Unknown (10/24/2021)   Social Connection and Isolation Panel [NHANES]    Frequency of Communication with Friends and Family: More than three times a week    Frequency of Social Gatherings with Friends and Family: Once a week    Attends Religious Services: More than 4 times per year    Active Member of [employer] or Organizations: Yes    Attends Banker Meetings: Patient refused    Marital Status: Patient refused  Intimate Partner Violence: Not At Risk (10/24/2021)   Humiliation, Afraid, Rape, and Kick questionnaire    Fear of Current or Ex-Partner: No    Emotionally Abused: No    Physically Abused: No    Sexually Abused: No   Health Maintenance  Topic Date Due   Zoster Vaccines- Shingrix (1 of 2) Never done   DEXA SCAN  Never done   Diabetic kidney evaluation - Urine ACR  11/30/2020   COVID-19 Vaccine (5 - Moderna series) 09/28/2021   INFLUENZA VACCINE  04/23/2022   Diabetic kidney evaluation - GFR measurement  06/27/2022   HEMOGLOBIN A1C  08/10/2022   Pneumonia Vaccine 77+ Years old (1 - PCV) 10/24/2022 (Originally 07/30/2020)   FOOT EXAM  08/27/2022   OPHTHALMOLOGY EXAM  10/01/2022   Medicare Annual Wellness (AWV)  10/24/2022  MAMMOGRAM  08/09/2023   COLONOSCOPY (Pts 45-86yrs Insurance coverage will need to be confirmed)  04/16/2027   Hepatitis C Screening  Completed   HPV VACCINES  Aged Out    The following portions of the patient's history were reviewed and updated as appropriate: allergies, current medications, past family history, past medical history, past social history, past surgical history, and problem list.  Review of Systems Pertinent items noted in HPI and remainder of comprehensive ROS otherwise negative.   Objective:    BP 136/82 (BP Location: Left Arm, Patient Position: Sitting, Cuff Size: Normal)   Pulse 75   Temp 98.7 F (37.1 C) (Oral)   Ht 5' 5.25" (1.657 m)   Wt 179 lb 12.8 oz (81.6 kg)   SpO2 98%    BMI 29.69 kg/m  General appearance: alert, cooperative, and no distress Head: Normocephalic, without obvious abnormality, atraumatic Eyes: conjunctivae/corneas clear. PERRL, EOM's intact. Fundi benign. Ears: normal TM's and external ear canals both ears Nose: Nares normal. Septum midline. Mucosa normal. No drainage or sinus tenderness. Throat: lips, mucosa, and tongue normal; teeth and gums normal Neck: no adenopathy, no carotid bruit, no JVD, supple, symmetrical, trachea midline, and thyroid not enlarged, symmetric, no tenderness/mass/nodules Lungs: clear to auscultation bilaterally Heart: regular rate and rhythm, S1, S2 normal, no murmur, click, rub or gallop Abdomen: soft, non-tender; bowel sounds normal; no masses,  no organomegaly Extremities: extremities normal, atraumatic, no cyanosis or edema Pulses: 2+ and symmetric Skin: Skin color, texture, turgor normal. No rashes or lesions Lymph nodes: Cervical, supraclavicular, and axillary nodes normal. Neurologic: Alert and oriented X 3, normal strength and tone. Normal symmetric reflexes. Normal coordination and gait    Assessment:    Healthy female exam.      Plan:    Anticipatory guidance given including wearing seatbelts, smoke detectors in the home, increasing physical activity, increasing p.o. intake of water and vegetables. -Labs -Mammogram this month -Colonoscopy done 08/20/2017 -Immunizations reviewed.  Patient had recent COVID 19 vaccine See After Visit Summary for Counseling Recommendations  Well adult exam - Plan: CBC with Differential/Platelet  Postoperative hypothyroidism  -Continue levothyroxine 75 mcg daily - Plan: TSH  Type 2 diabetes mellitus with hyperlipidemia (HCC)  -Hemoglobin A1c 7.0% on 02/07/2022 -Continue lifestyle modifications -Can increase glipizide to 10 mg in pm for continued blood sugar elevation. - Plan: Hemoglobin A1c, Lipid panel, CMP, Microalbumin/Creatinine Ratio, Urine  Asymptomatic  postmenopausal state  - Plan: DG Bone Density  F/u prn  Abbe Amsterdam, MD

## 2022-08-12 NOTE — Patient Instructions (Addendum)
A bone density scan was ordered for you.  It screen for osteoporosis and ostopenia

## 2022-08-13 LAB — COMPREHENSIVE METABOLIC PANEL
ALT: 14 U/L (ref 0–35)
AST: 14 U/L (ref 0–37)
Albumin: 4.6 g/dL (ref 3.5–5.2)
Alkaline Phosphatase: 51 U/L (ref 39–117)
BUN: 13 mg/dL (ref 6–23)
CO2: 27 mEq/L (ref 19–32)
Calcium: 10.2 mg/dL (ref 8.4–10.5)
Chloride: 100 mEq/L (ref 96–112)
Creatinine, Ser: 0.73 mg/dL (ref 0.40–1.20)
GFR: 85.33 mL/min (ref >60.00)
Glucose, Bld: 87 mg/dL (ref 70–99)
Potassium: 3.6 mEq/L (ref 3.5–5.1)
Sodium: 140 mEq/L (ref 135–145)
Total Bilirubin: 0.5 mg/dL (ref 0.2–1.2)
Total Protein: 7.6 g/dL (ref 6.0–8.3)

## 2022-08-13 LAB — LIPID PANEL
Cholesterol: 167 mg/dL (ref 0–200)
HDL: 49.6 mg/dL (ref >39.00)
LDL Cholesterol: 102 mg/dL — ABNORMAL HIGH (ref 0–99)
NonHDL: 117.89
Total CHOL/HDL Ratio: 3
Triglycerides: 81 mg/dL (ref 0.0–149.0)
VLDL: 16.2 mg/dL (ref 0.0–40.0)

## 2022-08-13 LAB — MICROALBUMIN / CREATININE URINE RATIO
Creatinine,U: 88.4 mg/dL
Microalb Creat Ratio: 0.8 mg/g (ref 0.0–30.0)
Microalb, Ur: <0.7 mg/dL (ref 0.0–1.9)

## 2022-08-14 ENCOUNTER — Other Ambulatory Visit: Payer: Self-pay | Admitting: Family Medicine

## 2022-08-14 ENCOUNTER — Encounter: Payer: Self-pay | Admitting: Family Medicine

## 2022-08-14 DIAGNOSIS — E119 Type 2 diabetes mellitus without complications: Secondary | ICD-10-CM

## 2022-08-14 DIAGNOSIS — E890 Postprocedural hypothyroidism: Secondary | ICD-10-CM

## 2022-08-14 DIAGNOSIS — I10 Essential (primary) hypertension: Secondary | ICD-10-CM

## 2022-08-20 ENCOUNTER — Encounter: Payer: Self-pay | Admitting: Family Medicine

## 2022-08-21 ENCOUNTER — Ambulatory Visit: Payer: Medicare HMO | Attending: Physician Assistant | Admitting: Physician Assistant

## 2022-08-21 ENCOUNTER — Encounter: Payer: Self-pay | Admitting: Physician Assistant

## 2022-08-21 VITALS — BP 124/72 | HR 86 | Ht 65.25 in | Wt 181.0 lb

## 2022-08-21 DIAGNOSIS — I201 Angina pectoris with documented spasm: Secondary | ICD-10-CM | POA: Diagnosis not present

## 2022-08-21 DIAGNOSIS — E1169 Type 2 diabetes mellitus with other specified complication: Secondary | ICD-10-CM | POA: Diagnosis not present

## 2022-08-21 DIAGNOSIS — E785 Hyperlipidemia, unspecified: Secondary | ICD-10-CM | POA: Diagnosis not present

## 2022-08-21 DIAGNOSIS — E7849 Other hyperlipidemia: Secondary | ICD-10-CM | POA: Diagnosis not present

## 2022-08-21 DIAGNOSIS — I10 Essential (primary) hypertension: Secondary | ICD-10-CM

## 2022-08-21 NOTE — Patient Instructions (Signed)
Medication Instructions:  Your physician recommends that you continue on your current medications as directed. Please refer to the Current Medication list given to you today. *If you need a refill on your cardiac medications before your next appointment, please call your pharmacy*  Lab Work: NONE ORDERED  Testing/Procedures: NONE ORDERED  Follow-Up: At [facility], you and your health needs are our priority.  As part of our continuing mission to provide you with exceptional heart care, we have created designated Provider Care Teams.  These Care Teams include your primary Cardiologist (physician) and Advanced Practice Providers (APPs -  Physician Assistants and Nurse Practitioners) who all work together to provide you with the care you need, when you need it.  We recommend signing up for the patient portal called "MyChart".  Sign up information is provided on this After Visit Summary.  MyChart is used to connect with patients for Virtual Visits (Telemedicine).  Patients are able to view lab/test results, encounter notes, upcoming appointments, etc.  Non-urgent messages can be sent to your provider as well.   To learn more about what you can do with MyChart, go to [email or web address].    Your next appointment:   12 month(s)  The format for your next appointment:   In Person  Provider:   Verne Carrow, MD     Other Instructions EXERCISE 150 MINUTES A WEEK  Important Information About Sugar

## 2022-08-22 DIAGNOSIS — Z780 Asymptomatic menopausal state: Secondary | ICD-10-CM | POA: Diagnosis not present

## 2022-08-22 DIAGNOSIS — Z6829 Body mass index (BMI) 29.0-29.9, adult: Secondary | ICD-10-CM | POA: Diagnosis not present

## 2022-08-22 DIAGNOSIS — Z124 Encounter for screening for malignant neoplasm of cervix: Secondary | ICD-10-CM | POA: Diagnosis not present

## 2022-08-22 DIAGNOSIS — Z1231 Encounter for screening mammogram for malignant neoplasm of breast: Secondary | ICD-10-CM | POA: Diagnosis not present

## 2022-08-22 DIAGNOSIS — Z1239 Encounter for other screening for malignant neoplasm of breast: Secondary | ICD-10-CM | POA: Diagnosis not present

## 2022-08-22 DIAGNOSIS — Z01419 Encounter for gynecological examination (general) (routine) without abnormal findings: Secondary | ICD-10-CM | POA: Diagnosis not present

## 2022-08-22 LAB — HM MAMMOGRAPHY

## 2022-08-26 ENCOUNTER — Other Ambulatory Visit: Payer: Self-pay | Admitting: Family Medicine

## 2022-08-26 DIAGNOSIS — E785 Hyperlipidemia, unspecified: Secondary | ICD-10-CM

## 2022-08-27 ENCOUNTER — Encounter: Payer: Self-pay | Admitting: Family Medicine

## 2022-08-28 ENCOUNTER — Encounter: Payer: Self-pay | Admitting: Family Medicine

## 2022-08-28 LAB — HM PAP SMEAR: HM Pap smear: NORMAL

## 2022-08-30 ENCOUNTER — Encounter: Payer: Self-pay | Admitting: Family Medicine

## 2022-09-10 DIAGNOSIS — L26 Exfoliative dermatitis: Secondary | ICD-10-CM | POA: Diagnosis not present

## 2022-09-19 ENCOUNTER — Other Ambulatory Visit: Payer: Self-pay | Admitting: Family Medicine

## 2022-09-19 DIAGNOSIS — E1169 Type 2 diabetes mellitus with other specified complication: Secondary | ICD-10-CM

## 2022-10-01 DIAGNOSIS — E119 Type 2 diabetes mellitus without complications: Secondary | ICD-10-CM | POA: Diagnosis not present

## 2022-10-01 DIAGNOSIS — N907 Vulvar cyst: Secondary | ICD-10-CM | POA: Diagnosis not present

## 2022-10-03 DIAGNOSIS — Z01419 Encounter for gynecological examination (general) (routine) without abnormal findings: Secondary | ICD-10-CM | POA: Diagnosis not present

## 2022-10-03 DIAGNOSIS — N907 Vulvar cyst: Secondary | ICD-10-CM | POA: Diagnosis not present

## 2022-10-14 DIAGNOSIS — L26 Exfoliative dermatitis: Secondary | ICD-10-CM | POA: Diagnosis not present

## 2022-10-25 ENCOUNTER — Ambulatory Visit (INDEPENDENT_AMBULATORY_CARE_PROVIDER_SITE_OTHER): Payer: Medicare HMO

## 2022-10-25 VITALS — BP 122/62 | HR 85 | Temp 98.2°F | Ht 65.25 in | Wt 184.4 lb

## 2022-10-25 DIAGNOSIS — Z0000 Encounter for general adult medical examination without abnormal findings: Secondary | ICD-10-CM | POA: Diagnosis not present

## 2022-10-25 NOTE — Patient Instructions (Addendum)
Jeanne [name] , Thank you for taking time to come for your Medicare Wellness Visit. I appreciate your ongoing commitment to your health goals. Please review the following plan we discussed and let me know if I can assist you in the future.   These are the goals we discussed:  Goals       Increase physical activity (pt-stated)      Continue to Increase physical activity. Lose 10-15 lbs        This is a list of the screening recommended for you and due dates:  Health Maintenance  Topic Date Due   DEXA scan (bone density measurement)  Never done   Complete foot exam   10/26/2022*   COVID-19 Vaccine (6 - 2023-24 season) 11/10/2022*   Flu Shot  12/22/2022*   Zoster (Shingles) Vaccine (1 of 2) 01/23/2023*   Pneumonia Vaccine (1 - PCV) 10/26/2023*   Hemoglobin A1C  02/10/2023   Mammogram  08/09/2023   Yearly kidney function blood test for diabetes  08/13/2023   Yearly kidney health urinalysis for diabetes  08/13/2023   Eye exam for diabetics  10/02/2023   Medicare Annual Wellness Visit  10/26/2023   DTaP/Tdap/Td vaccine (2 - Td or Tdap) 12/06/2023   Colon Cancer Screening  08/21/2027   Hepatitis C Screening: USPSTF Recommendation to screen - Ages 18-79 yo.  Completed   HPV Vaccine  Aged Out  *Topic was postponed. The date shown is not the original due date.    Advanced directives: Please bring a copy of your health care power of attorney and living will to the office to be added to your chart at your convenience.   Conditions/risks identified: None  Next appointment: Follow up in one year for your annual wellness visit     Preventive Care 68 Years and Older, Female Preventive care refers to lifestyle choices and visits with your health care provider that can promote health and wellness. What does preventive care include? A yearly physical exam. This is also called an annual well check. Dental exams once or twice a year. Routine eye exams. Ask your health care provider how often  you should have your eyes checked. Personal lifestyle choices, including: Daily care of your teeth and gums. Regular physical activity. Eating a healthy diet. Avoiding tobacco and drug use. Limiting alcohol use. Practicing safe sex. Taking low-dose aspirin every day. Taking vitamin and mineral supplements as recommended by your health care provider. What happens during an annual well check? The services and screenings done by your health care provider during your annual well check will depend on your age, overall health, lifestyle risk factors, and family history of disease. Counseling  Your health care provider may ask you questions about your: Alcohol use. Tobacco use. Drug use. Emotional well-being. Home and relationship well-being. Sexual activity. Eating habits. History of falls. Memory and ability to understand (cognition). Work and work Statistician. Reproductive health. Screening  You may have the following tests or measurements: Height, weight, and BMI. Blood pressure. Lipid and cholesterol levels. These may be checked every 5 years, or more frequently if you are over 68 years old. Skin check. Lung cancer screening. You may have this screening every year starting at age 68 if you have a 30-pack-year history of smoking and currently smoke or have quit within the past 15 years. Fecal occult blood test (FOBT) of the stool. You may have this test every year starting at age 68. Flexible sigmoidoscopy or colonoscopy. You may have a sigmoidoscopy every  5 years or a colonoscopy every 10 years starting at age 68. Hepatitis C blood test. Hepatitis B blood test. Sexually transmitted disease (STD) testing. Diabetes screening. This is done by checking your blood sugar (glucose) after you have not eaten for a while (fasting). You may have this done every 1-3 years. Bone density scan. This is done to screen for osteoporosis. You may have this done starting at age 68. Mammogram. This  may be done every 1-2 years. Talk to your health care provider about how often you should have regular mammograms. Talk with your health care provider about your test results, treatment options, and if necessary, the need for more tests. Vaccines  Your health care provider may recommend certain vaccines, such as: Influenza vaccine. This is recommended every year. Tetanus, diphtheria, and acellular pertussis (Tdap, Td) vaccine. You may need a Td booster every 10 years. Zoster vaccine. You may need this after age 68. Pneumococcal 13-valent conjugate (PCV13) vaccine. One dose is recommended after age 68. Pneumococcal polysaccharide (PPSV23) vaccine. One dose is recommended after age 68. Talk to your health care provider about which screenings and vaccines you need and how often you need them. This information is not intended to replace advice given to you by your health care provider. Make sure you discuss any questions you have with your health care provider. Document Released: 10/06/2015 Document Revised: 05/29/2016 Document Reviewed: 07/11/2015 Elsevier Interactive Patient Education  2017 [place] Prevention in the Home Falls can cause injuries. They can happen to people of all ages. There are many things you can do to make your home safe and to help prevent falls. What can I do on the outside of my home? Regularly fix the edges of walkways and driveways and fix any cracks. Remove anything that might make you trip as you walk through a door, such as a raised step or threshold. Trim any bushes or trees on the path to your home. Use bright outdoor lighting. Clear any walking paths of anything that might make someone trip, such as rocks or tools. Regularly check to see if handrails are loose or broken. Make sure that both sides of any steps have handrails. Any raised decks and porches should have guardrails on the edges. Have any leaves, snow, or ice cleared regularly. Use sand or  salt on walking paths during winter. Clean up any spills in your garage right away. This includes oil or grease spills. What can I do in the bathroom? Use night lights. Install grab bars by the toilet and in the tub and shower. Do not use towel bars as grab bars. Use non-skid mats or decals in the tub or shower. If you need to sit down in the shower, use a plastic, non-slip stool. Keep the floor dry. Clean up any water that spills on the floor as soon as it happens. Remove soap buildup in the tub or shower regularly. Attach bath mats securely with double-sided non-slip rug tape. Do not have throw rugs and other things on the floor that can make you trip. What can I do in the bedroom? Use night lights. Make sure that you have a light by your bed that is easy to reach. Do not use any sheets or blankets that are too big for your bed. They should not hang down onto the floor. Have a firm chair that has side arms. You can use this for support while you get dressed. Do not have throw rugs and other things on  the floor that can make you trip. What can I do in the kitchen? Clean up any spills right away. Avoid walking on wet floors. Keep items that you use a lot in easy-to-reach places. If you need to reach something above you, use a strong step stool that has a grab bar. Keep electrical cords out of the way. Do not use floor polish or wax that makes floors slippery. If you must use wax, use non-skid floor wax. Do not have throw rugs and other things on the floor that can make you trip. What can I do with my stairs? Do not leave any items on the stairs. Make sure that there are handrails on both sides of the stairs and use them. Fix handrails that are broken or loose. Make sure that handrails are as long as the stairways. Check any carpeting to make sure that it is firmly attached to the stairs. Fix any carpet that is loose or worn. Avoid having throw rugs at the top or bottom of the stairs. If  you do have throw rugs, attach them to the floor with carpet tape. Make sure that you have a light switch at the top of the stairs and the bottom of the stairs. If you do not have them, ask someone to add them for you. What else can I do to help prevent falls? Wear shoes that: Do not have high heels. Have rubber bottoms. Are comfortable and fit you well. Are closed at the toe. Do not wear sandals. If you use a stepladder: Make sure that it is fully opened. Do not climb a closed stepladder. Make sure that both sides of the stepladder are locked into place. Ask someone to hold it for you, if possible. Clearly mark and make sure that you can see: Any grab bars or handrails. First and last steps. Where the edge of each step is. Use tools that help you move around (mobility aids) if they are needed. These include: Canes. Walkers. Scooters. Crutches. Turn on the lights when you go into a dark area. Replace any light bulbs as soon as they burn out. Set up your furniture so you have a clear path. Avoid moving your furniture around. If any of your floors are uneven, fix them. If there are any pets around you, be aware of where they are. Review your medicines with your doctor. Some medicines can make you feel dizzy. This can increase your chance of falling. Ask your doctor what other things that you can do to help prevent falls. This information is not intended to replace advice given to you by your health care provider. Make sure you discuss any questions you have with your health care provider. Document Released: 07/06/2009 Document Revised: 02/15/2016 Document Reviewed: 10/14/2014 Elsevier Interactive Patient Education  2017 [employer].

## 2022-10-25 NOTE — Progress Notes (Signed)
Subjective:   Jeanne [name] is a 67 y.o. female who presents for Medicare Annual (Subsequent) preventive examination.  Review of Systems     Cardiac Risk Factors include: advanced age (>28men, >62 women);diabetes mellitus;hypertension     Objective:    Today's Vitals   10/25/22 1327  BP: 122/62  Pulse: 85  Temp: 98.2 F (36.8 C)  TempSrc: Oral  SpO2: 98%  Weight: 184 lb 6.4 oz (83.6 kg)  Height: 5' 5.25" (1.657 m)   Body mass index is 30.45 kg/m.     10/25/2022    1:37 PM 10/24/2021    1:42 PM 07/14/2021   12:19 PM 09/22/2012    1:00 AM 09/22/2012   12:00 AM  Advanced Directives  Does Patient Have a Medical Advance Directive? Yes Yes No  Patient does not have advance directive;Patient would not like information  Type of Scientist, forensic Power of [place];Living will [place];Living will     Does patient want to make changes to medical advance directive?  No - Patient declined     Copy of [place] in Chart? No - copy requested No - copy requested     Pre-existing out of facility DNR order (yellow form or pink MOST form)    No No    Current Medications (verified) Outpatient Encounter Medications as of 10/25/2022  Medication Sig   Accu-Chek FastClix Lancets MISC TEST BLOOD SUGAR DAILY   amLODipine (NORVASC) 5 MG tablet TAKE 1 TABLET EVERY DAY   aspirin 81 MG tablet Take 81 mg by mouth daily.   blood glucose meter kit and supplies KIT Dispense based on patient and insurance preference. Use up to four times daily as directed. (FOR ICD-9 250.00, 250.01).   Blood Glucose Monitoring Suppl (ACCU-CHEK GUIDE) w/Device KIT 1 kit by Does not apply route daily.   CALCIUM PO Take 1 tablet by mouth daily. Chewable calcium   carvedilol (COREG) 3.125 MG tablet TAKE 1 TABLET TWICE DAILY WITH MEALS   glipiZIDE (GLUCOTROL) 5 MG tablet TAKE 1 TABLET (5 MG TOTAL) BY MOUTH TWICE A DAY BEFORE MEALS   glucose blood test strip Use as  instructed   levothyroxine (SYNTHROID) 75 MCG tablet TAKE 1 TABLET EVERY MORNING ON AN EMPTY STOMACH   metFORMIN (GLUCOPHAGE) 500 MG tablet TAKE 2 TABLETS TWICE DAILY   nitroGLYCERIN (NITROSTAT) 0.4 MG SL tablet Place 1 tablet (0.4 mg total) under the tongue every 5 (five) minutes x 3 doses as needed for chest pain.   pravastatin (PRAVACHOL) 10 MG tablet TAKE 1 TABLET EVERY DAY   triamcinolone (KENALOG) 0.025 % ointment Apply 1 application. topically 2 (two) times daily.   valsartan-hydrochlorothiazide (DIOVAN-HCT) 160-25 MG tablet TAKE 1 TABLET EVERY DAY   No facility-administered encounter medications on file as of 10/25/2022.    Allergies (verified) Eggs or egg-derived products, Penicillins, and Pollen extract   History: Past Medical History:  Diagnosis Date   Anemia    Arthritis    Diabetes mellitus    Hypercholesteremia 2011?   Hypertension    NSTEMI (non-ST elevated myocardial infarction) [facility]) December 2013   negative cath, normal LV function - felt to be vasospasm   Thyroid disease    Past Surgical History:  Procedure Laterality Date   CARDIAC CATHETERIZATION  December 2013   normal coronaries and normal LV function   FOOT SURGERY     LEFT HEART CATHETERIZATION WITH CORONARY ANGIOGRAM N/A 09/22/2012   Procedure: LEFT HEART CATHETERIZATION WITH CORONARY  Cyril Loosen;  Surgeon: Sherren Mocha, MD;  Location: [facility] CATH LAB;  Service: Cardiovascular;  Laterality: N/A;   THYROID SURGERY     Partial removed per pt   History reviewed. No pertinent family history. Social History   Socioeconomic History   Marital status: Single    Spouse name: Not on file   Number of children: Not on file   Years of education: Not on file   Highest education level: Bachelor's degree (e.g., BA, AB, BS)  Occupational History   Not on file  Tobacco Use   Smoking status: Former    Types: Cigarettes    Quit date: 11/21/1990    Years since quitting: 31.9   Smokeless tobacco: Never  Vaping Use    Vaping Use: Never used  Substance and Sexual Activity   Alcohol use: No   Drug use: No   Sexual activity: Yes  Other Topics Concern   Not on file  Social History Narrative   Not on file   Social Determinants of Health   Financial Resource Strain: Low Risk  (10/25/2022)   Overall Financial Resource Strain (CARDIA)    Difficulty of Paying Living Expenses: Not hard at all  Food Insecurity: No Food Insecurity (10/25/2022)   Hunger Vital Sign    Worried About Running Out of Food in the Last Year: Never true    Ran Out of Food in the Last Year: Never true  Transportation Needs: No Transportation Needs (10/25/2022)   PRAPARE - Hydrologist (Medical): No    Lack of Transportation (Non-Medical): No  Physical Activity: Insufficiently Active (10/25/2022)   Exercise Vital Sign    Days of Exercise per Week: 4 days    Minutes of Exercise per Session: 30 min  Stress: No Stress Concern Present (10/25/2022)   [place]    Feeling of Stress : Not at all  Social Connections: Moderately Integrated (10/25/2022)   Social Connection and Isolation Panel [NHANES]    Frequency of Communication with Friends and Family: More than three times a week    Frequency of Social Gatherings with Friends and Family: More than three times a week    Attends Religious Services: More than 4 times per year    Active Member of [employer] or Organizations: Yes    Attends Music therapist: More than 4 times per year    Marital Status: Never married    Tobacco Counseling Counseling given: Not Answered   Clinical Intake:  Pre-visit preparation completed: No  Pain : No/denies pain Nutrition Risk Assessment:  Has the patient had any N/V/D within the last 2 months?  No  Does the patient have any non-healing wounds?  No  Has the patient had any unintentional weight loss or weight gain?  No   Diabetes:  Is the patient  diabetic?  Yes  If diabetic, was a CBG obtained today?  No  Did the patient bring in their glucometer from home?  No  How often do you monitor your CBG's? Daily.   Financial Strains and Diabetes Management:  Are you having any financial strains with the device, your supplies or your medication? No .  Does the patient want to be seen by Chronic Care Management for management of their diabetes?  No  Would the patient like to be referred to a Nutritionist or for Diabetic Management?  No   Diabetic Exams:  Diabetic Eye Exam: Completed No. Overdue for diabetic  eye exam. Pt has been advised about the importance in completing this exam. A referral has been placed today. Message sent to referral coordinator for scheduling purposes. Advised pt to expect a call from office referred to regarding appt.  Diabetic Foot Exam: Completed No. Pt has been advised about the importance in completing this exam. Pt is scheduled for diabetic foot exam on Followed by PCP.      BMI - recorded: 30.45 Nutritional Status: BMI > 30  Obese Nutritional Risks: None Diabetes: Yes CBG done?: No Did pt. bring in CBG monitor from home?: No  How often do you need to have someone help you when you read instructions, pamphlets, or other written materials from your doctor or pharmacy?: 1 - Never  Diabetic?  Yes  Interpreter Needed?: No  Information entered by :: Rolene Arbour LPN   Activities of Daily Living    10/25/2022    1:36 PM  In your present state of health, do you have any difficulty performing the following activities:  Hearing? 0  Vision? 0  Difficulty concentrating or making decisions? 0  Walking or climbing stairs? 0  Dressing or bathing? 0  Doing errands, shopping? 0  Preparing Food and eating ? N  Using the Toilet? N  In the past six months, have you accidently leaked urine? N  Do you have problems with loss of bowel control? N  Managing your Medications? N  Managing your Finances? N   Housekeeping or managing your Housekeeping? N    Patient Care Team: Billie Ruddy, MD as PCP - General (Family Medicine) Burnell Blanks, MD as PCP - Cardiology (Cardiology)  Indicate any recent Medical Services you may have received from other than [facility] providers in the past year (date may be approximate).     Assessment:   This is a routine wellness examination for [place].  Hearing/Vision screen Hearing Screening - Comments:: Denies hearing difficulties   Vision Screening - Comments:: Wears rx glasses - up to date with routine eye exams with  Dr [place]  Dietary issues and exercise activities discussed: Exercise limited by: None identified   Goals Addressed               This Visit's Progress     Increase physical activity (pt-stated)        Continue to Increase physical activity. Lose 10-15 lbs       Depression Screen    10/25/2022    1:35 PM 08/12/2022    1:11 PM 03/11/2022    1:22 PM 02/07/2022   10:59 AM 10/24/2021    1:28 PM 08/13/2021    3:35 PM 06/07/2020    4:42 PM  PHQ 2/9 Scores  PHQ - 2 Score 0 0 1 1 0 0 1  PHQ- 9 Score  1 2 3  3 8     Fall Risk    10/25/2022    1:37 PM 08/12/2022    1:11 PM 03/11/2022    1:21 PM 02/07/2022   10:59 AM 10/24/2021    1:33 PM  Fall Risk   Falls in the past year? 0 0 1 1 1   Number falls in past yr: 0 0 0 0 0  Injury with Fall? 0 0 1 1 1   Comment     Fall result concussion to left side forehead. Followed by PCP  Risk for fall due to : No Fall Risks No Fall Risks Other (Comment) Other (Comment) Impaired balance/gait  Follow up Falls prevention discussed Falls  evaluation completed Falls evaluation completed Falls evaluation completed     FALL RISK PREVENTION PERTAINING TO THE HOME:  Any stairs in or around the home? Yes  If so, are there any without handrails? No  Home free of loose throw rugs in walkways, pet beds, electrical cords, etc? Yes  Adequate lighting in your home to reduce risk of falls? Yes    ASSISTIVE DEVICES UTILIZED TO PREVENT FALLS:  Life alert? No  Use of a cane, walker or w/c? No  Grab bars in the bathroom? Yes  Shower chair or bench in shower? No  Elevated toilet seat or a handicapped toilet? No   TIMED UP AND GO:  Was the test performed? Yes .  Length of time to ambulate 10 feet: 10 sec.   Gait steady and fast without use of assistive device  Cognitive Function:        10/25/2022    1:37 PM 10/24/2021    1:40 PM  6CIT Screen  What Year? 0 points 0 points  What month? 0 points 0 points  What time? 0 points 0 points  Count back from 20 0 points 0 points  Months in reverse 0 points 0 points  Repeat phrase 0 points 0 points  Total Score 0 points 0 points    Immunizations Immunization History  Administered Date(s) Administered   Influenza Inj Mdck Quad Pf 06/27/2021   Influenza-Unspecified 07/18/2021   MODERNA COVID-19 SARS-COV-2 PEDS BIVALENT BOOSTER 6Y-11Y 08/03/2021   Moderna Sars-Covid-2 Vaccination 01/22/2020, 03/02/2020, 08/28/2020, 08/03/2021   Tdap 12/05/2013    TDAP status: Up to date  Flu Vaccine status: Declined, Education has been provided regarding the importance of this vaccine but patient still declined. Advised may receive this vaccine at local pharmacy or Health Dept. Aware to provide a copy of the vaccination record if obtained from local pharmacy or Health Dept. Verbalized acceptance and understanding.  Pneumococcal vaccine status: Due, Education has been provided regarding the importance of this vaccine. Advised may receive this vaccine at local pharmacy or Health Dept. Aware to provide a copy of the vaccination record if obtained from local pharmacy or Health Dept. Verbalized acceptance and understanding.  Covid-19 vaccine status: Completed vaccines  Qualifies for Shingles Vaccine? Yes   Zostavax completed No   Shingrix Completed?: No.    Education has been provided regarding the importance of this vaccine. Patient has been  advised to call insurance company to determine out of pocket expense if they have not yet received this vaccine. Advised may also receive vaccine at local pharmacy or Health Dept. Verbalized acceptance and understanding.  Screening Tests Health Maintenance  Topic Date Due   DEXA SCAN  Never done   FOOT EXAM  10/26/2022 (Originally 08/27/2022)   COVID-19 Vaccine (6 - 2023-24 season) 11/10/2022 (Originally 05/24/2022)   INFLUENZA VACCINE  12/22/2022 (Originally 04/23/2022)   Zoster Vaccines- Shingrix (1 of 2) 01/23/2023 (Originally 07/30/2005)   Pneumonia Vaccine 28+ Years old (1 - PCV) 10/26/2023 (Originally 07/30/2020)   HEMOGLOBIN A1C  02/10/2023   MAMMOGRAM  08/09/2023   Diabetic kidney evaluation - eGFR measurement  08/13/2023   Diabetic kidney evaluation - Urine ACR  08/13/2023   OPHTHALMOLOGY EXAM  10/02/2023   Medicare Annual Wellness (AWV)  10/26/2023   DTaP/Tdap/Td (2 - Td or Tdap) 12/06/2023   COLONOSCOPY (Pts 45-86yrs Insurance coverage will need to be confirmed)  08/21/2027   Hepatitis C Screening  Completed   HPV VACCINES  Aged Out    Health Maintenance  Health Maintenance Due  Topic Date Due   DEXA SCAN  Never done    Colorectal cancer screening: Type of screening: Colonoscopy. Completed 08/20/17. Repeat every 10 years  Mammogram status: Completed 08/08/21. Repeat every year    Lung Cancer Screening: (Low Dose CT Chest recommended if Age 53-80 years, 30 pack-year currently smoking OR have quit w/in 15years.) does not qualify.     Additional Screening:  Hepatitis C Screening: does qualify; Completed 12/01/19  Vision Screening: Recommended annual ophthalmology exams for early detection of glaucoma and other disorders of the eye. Is the patient up to date with their annual eye exam?  Yes  Who is the provider or what is the name of the office in which the patient attends annual eye exams? Dr [place] If pt is not established with a provider, would they like to be referred  to a provider to establish care? No .   Dental Screening: Recommended annual dental exams for proper oral hygiene  Community Resource Referral / Chronic Care Management:  CRR required this visit?  No   CCM required this visit?  No      Plan:     I have personally reviewed and noted the following in the patient's chart:   Medical and social history Use of alcohol, tobacco or illicit drugs  Current medications and supplements including opioid prescriptions. Patient is not currently taking opioid prescriptions. Functional ability and status Nutritional status Physical activity Advanced directives List of other physicians Hospitalizations, surgeries, and ER visits in previous 12 months Vitals Screenings to include cognitive, depression, and falls Referrals and appointments  In addition, I have reviewed and discussed with patient certain preventive protocols, quality metrics, and best practice recommendations. A written personalized care plan for preventive services as well as general preventive health recommendations were provided to patient.     Tillie Rung, LPN   05/24/4781   Nurse Notes: None

## 2022-11-05 DIAGNOSIS — L26 Exfoliative dermatitis: Secondary | ICD-10-CM | POA: Diagnosis not present

## 2022-11-21 ENCOUNTER — Ambulatory Visit (INDEPENDENT_AMBULATORY_CARE_PROVIDER_SITE_OTHER): Payer: Medicare HMO | Admitting: Family Medicine

## 2022-11-21 VITALS — BP 136/80 | HR 95 | Temp 99.2°F | Wt 187.0 lb

## 2022-11-21 DIAGNOSIS — R059 Cough, unspecified: Secondary | ICD-10-CM

## 2022-11-21 DIAGNOSIS — J029 Acute pharyngitis, unspecified: Secondary | ICD-10-CM | POA: Diagnosis not present

## 2022-11-21 DIAGNOSIS — J309 Allergic rhinitis, unspecified: Secondary | ICD-10-CM | POA: Diagnosis not present

## 2022-11-21 LAB — POC COVID19 BINAXNOW: SARS Coronavirus 2 Ag: NEGATIVE

## 2022-11-21 LAB — POCT RAPID STREP A (OFFICE): Rapid Strep A Screen: NEGATIVE

## 2022-11-21 NOTE — Progress Notes (Signed)
   Established Patient Office Visit   Subjective  Patient ID: Jeanne [name], female    DOB: May 17, 1956  Age: 67 y.o. MRN: [id number]  Chief Complaint  Patient presents with   Cough    Patient complains of cough, x2 days, Tried Tylenol and Robitussin with little relief   Sore Throat    Patient complains of sore throat, x1 week, Tried Tylenol and Robitussin with little relief     Pt is a 67 yo female with pmh sig for HTN, DM II, HLD, hypothyroidism, h/o angina who was seen for acute concern.  Pt endorses slightly productive cough x 2 weeks.  Patient also with sore throat, mild nasal congestion, headache, mild rhinorrhea x 1 week.  Patient tried exercise Tylenol and Robitussin for symptoms.  Denies ear pain/pressure, fever, chills, facial pain/pressure, dental pain/pressure.  Cough  Sore Throat  Associated symptoms include coughing.      Review of Systems  Respiratory:  Positive for cough.    Negative unless stated above    Objective:     BP 136/80 (BP Location: Left Arm, Patient Position: Sitting, Cuff Size: Large)   Pulse 95   Temp 99.2 F (37.3 C) (Oral)   Wt 187 lb (84.8 kg)   SpO2 98%   BMI 30.88 kg/m    Physical Exam Constitutional:      General: She is not in acute distress.    Appearance: Normal appearance.  HENT:     Head: Normocephalic and atraumatic.     Ears:     Comments: Bilateral TMs full without effusion.    Nose: Nose normal.     Mouth/Throat:     Mouth: Mucous membranes are moist.  Cardiovascular:     Rate and Rhythm: Normal rate and regular rhythm.     Heart sounds: Normal heart sounds. No murmur heard.    No gallop.  Pulmonary:     Effort: Pulmonary effort is normal. No respiratory distress.     Breath sounds: Normal breath sounds. No wheezing, rhonchi or rales.  Skin:    General: Skin is warm and dry.  Neurological:     Mental Status: She is alert and oriented to person, place, and time.      Results for orders placed or  performed in visit on 11/21/22  POC COVID-19  Result Value Ref Range   SARS Coronavirus 2 Ag Negative Negative  POC Rapid Strep A  Result Value Ref Range   Rapid Strep A Screen Negative Negative      Assessment & Plan:  Allergic rhinitis, unspecified seasonality, unspecified trigger  Sore throat -     POCT rapid strep A  Cough, unspecified type -     POC COVID-19 BinaxNow  COVID and strep testing negative.  Symptoms likely 2/2 postnasal drainage from allergic rhinitis.  Discussed trying OTC antihistamine.  Given samples of Zyrtec in clinic.  Consider Flonase nasal spray.  Continue hydration and other supportive care.  Given strict precautions.  Return if symptoms worsen or fail to improve.   Billie Ruddy, MD

## 2023-01-23 DIAGNOSIS — H401131 Primary open-angle glaucoma, bilateral, mild stage: Secondary | ICD-10-CM | POA: Diagnosis not present

## 2023-02-07 DIAGNOSIS — Z0100 Encounter for examination of eyes and vision without abnormal findings: Secondary | ICD-10-CM | POA: Diagnosis not present

## 2023-02-10 ENCOUNTER — Encounter: Payer: Self-pay | Admitting: Family Medicine

## 2023-02-10 ENCOUNTER — Ambulatory Visit (INDEPENDENT_AMBULATORY_CARE_PROVIDER_SITE_OTHER): Payer: Medicare HMO | Admitting: Family Medicine

## 2023-02-10 VITALS — BP 112/74 | HR 85 | Temp 98.7°F | Wt 188.0 lb

## 2023-02-10 DIAGNOSIS — E1169 Type 2 diabetes mellitus with other specified complication: Secondary | ICD-10-CM | POA: Diagnosis not present

## 2023-02-10 DIAGNOSIS — H02823 Cysts of right eye, unspecified eyelid: Secondary | ICD-10-CM | POA: Diagnosis not present

## 2023-02-10 DIAGNOSIS — Z6831 Body mass index (BMI) 31.0-31.9, adult: Secondary | ICD-10-CM

## 2023-02-10 DIAGNOSIS — Z7984 Long term (current) use of oral hypoglycemic drugs: Secondary | ICD-10-CM

## 2023-02-10 DIAGNOSIS — E785 Hyperlipidemia, unspecified: Secondary | ICD-10-CM | POA: Diagnosis not present

## 2023-02-10 DIAGNOSIS — E782 Mixed hyperlipidemia: Secondary | ICD-10-CM | POA: Diagnosis not present

## 2023-02-10 DIAGNOSIS — L308 Other specified dermatitis: Secondary | ICD-10-CM

## 2023-02-10 DIAGNOSIS — E669 Obesity, unspecified: Secondary | ICD-10-CM

## 2023-02-10 DIAGNOSIS — B070 Plantar wart: Secondary | ICD-10-CM

## 2023-02-10 LAB — POCT GLYCOSYLATED HEMOGLOBIN (HGB A1C): Hemoglobin A1C: 7 % — AB (ref 4.0–5.6)

## 2023-02-10 MED ORDER — GLIPIZIDE 5 MG PO TABS: ORAL_TABLET | ORAL | 1 refills | Status: DC

## 2023-02-10 MED ORDER — PRAVASTATIN SODIUM 10 MG PO TABS
10.0000 mg | ORAL_TABLET | Freq: Every day | ORAL | 1 refills | Status: DC
Start: 2023-02-10 — End: 2023-08-12

## 2023-02-10 NOTE — Progress Notes (Signed)
Established Patient Office Visit   Subjective  Patient ID: Jeanne [name], female    DOB: Aug 31, 1956  Age: 67 y.o. MRN: [id number]  Chief Complaint  Patient presents with   Medical Management of Chronic Issues    DM and BP. Numbers are still awful. 4/20 180, still up and down. Just started back exercising, and will start water aerobics.     Patient is a 67 year old female seen for follow-up on chronic conditions.  Patient states she is doing well overall.  Concerned blood sugar might be elevated due to increased snacking.  Patient states she stopped checking blood sugar due to frustration.  When last checked in April it was 180.  Pt feels like she gained wt, but it trying to get back on track.  Pt had to change water aerobics locations, now at the [place] [facility].  Also doing Pilates and chair yoga.      ROS Negative unless stated above    Objective:     BP 112/74 (BP Location: Left Arm, Patient Position: Sitting, Cuff Size: Normal)   Pulse 85   Temp 98.7 F (37.1 C) (Oral)   Wt 188 lb (85.3 kg)   SpO2 98%   BMI 31.05 kg/m    Physical Exam Constitutional:      General: She is not in acute distress.    Appearance: Normal appearance.  HENT:     Head: Normocephalic and atraumatic.     Nose: Nose normal.     Mouth/Throat:     Mouth: Mucous membranes are moist.  Cardiovascular:     Rate and Rhythm: Normal rate and regular rhythm.     Heart sounds: Normal heart sounds. No murmur heard.    No gallop.  Pulmonary:     Effort: Pulmonary effort is normal. No respiratory distress.     Breath sounds: Normal breath sounds. No wheezing, rhonchi or rales.  Skin:    General: Skin is warm and dry.     Comments: Small verrucae on b/l dorsum of feet.  3 mm round firm pustule on R medial upper eyelid.  Neurological:     Mental Status: She is alert and oriented to person, place, and time.    Diabetic Foot Exam - Simple   Simple Foot Form Diabetic Foot exam was performed  with the following findings: Yes 02/10/2023 12:31 PM  Visual Inspection No deformities, no ulcerations, no other skin breakdown bilaterally: Yes See comments: Yes Sensation Testing Intact to touch and monofilament testing bilaterally: Yes Pulse Check Posterior Tibialis and Dorsalis pulse intact bilaterally: Yes Comments Callus on medial right great toe.  Verruca noted on dorsum of bilateral feet.      Results for orders placed or performed in visit on 02/10/23  POC HgB A1c  Result Value Ref Range   Hemoglobin A1C 7.0 (A) 4.0 - 5.6 %   HbA1c POC (<> result, manual entry)     HbA1c, POC (prediabetic range)     HbA1c, POC (controlled diabetic range)        Assessment & Plan:  Other eczema -Continue regular moisturizers and steroid cream. -Continue follow-up with dermatology.  Type 2 diabetes mellitus with hyperlipidemia (HCC) -Controlled -Hemoglobin A1c 7.0% this visit -Foot exam done this visit -Eye exam done 10/01/2022 -Microalbumin creatinine ratio done 08/12/2022, normal. -Continue current medications including glipizide 5 mg twice daily, metformin 500 mg take 2 tablets twice daily -Continue statin and ARB -     glipiZIDE; TAKE 1 TABLET (5 MG TOTAL)  BY MOUTH TWICE A DAY BEFORE MEALS  Dispense: 180 tablet; Refill: 1 -     POCT glycosylated hemoglobin (Hb A1C)  Mixed hyperlipidemia -Continue lifestyle modifications -     Pravastatin Sodium; Take 1 tablet (10 mg total) by mouth daily.  Dispense: 90 tablet; Refill: 1  Verruca pedis -Discussed treatment options.   -Considering cryotherapy however wishes to wait   Milia of right eyelid -Discussed treatment options.  Patient wishes to wait at this time.  Class I obesity with serious comorbidity and body mass index (BMI) 31.0-31.9 in adult, unspecified obesity type -Body mass index is 31.05 kg/m. -Discussed cutting down on snacking -Continue water aerobics and other exercises  Return in about 3 months (around  05/13/2023).   Deeann Saint, MD

## 2023-03-24 ENCOUNTER — Ambulatory Visit (INDEPENDENT_AMBULATORY_CARE_PROVIDER_SITE_OTHER): Payer: Medicare HMO | Admitting: Family Medicine

## 2023-03-24 ENCOUNTER — Encounter: Payer: Self-pay | Admitting: Family Medicine

## 2023-03-24 VITALS — BP 126/80 | HR 89 | Temp 98.9°F | Resp 18 | Ht 65.0 in | Wt 186.2 lb

## 2023-03-24 DIAGNOSIS — Z7984 Long term (current) use of oral hypoglycemic drugs: Secondary | ICD-10-CM

## 2023-03-24 DIAGNOSIS — H6121 Impacted cerumen, right ear: Secondary | ICD-10-CM | POA: Diagnosis not present

## 2023-03-24 DIAGNOSIS — H65191 Other acute nonsuppurative otitis media, right ear: Secondary | ICD-10-CM | POA: Diagnosis not present

## 2023-03-24 DIAGNOSIS — E119 Type 2 diabetes mellitus without complications: Secondary | ICD-10-CM

## 2023-03-24 NOTE — Progress Notes (Signed)
Established Patient Office Visit   Subjective  Patient ID: Jeanne [name], female    DOB: Jul 09, 1956  Age: 67 y.o. MRN: [id number]  Chief Complaint  Patient presents with   Ear Fullness    Pt states her right ear feels like it is full Denies any ear pain     Patient is a 67 year old female who presents for acute concern.  Patient endorses sensation of clogged/full sensation in the right ear x 1 week.  Denies pain.  Tried anything for symptoms.  Patient notes blood sugars have been good.  A.m. blood sugars 1 teens, 126, highest was 142.  Ear Fullness     Past Medical History:  Diagnosis Date   Anemia    Arthritis    Diabetes mellitus    Hypercholesteremia 2011?   Hypertension    NSTEMI (non-ST elevated myocardial infarction) [facility]) December 2013   negative cath, normal LV function - felt to be vasospasm   Thyroid disease    Past Surgical History:  Procedure Laterality Date   CARDIAC CATHETERIZATION  December 2013   normal coronaries and normal LV function   FOOT SURGERY     LEFT HEART CATHETERIZATION WITH CORONARY ANGIOGRAM N/A 09/22/2012   Procedure: LEFT HEART CATHETERIZATION WITH CORONARY ANGIOGRAM;  Surgeon: Tonny Bollman, MD;  Location: [facility] CATH LAB;  Service: Cardiovascular;  Laterality: N/A;   THYROID SURGERY     Partial removed per pt   Social History   Tobacco Use   Smoking status: Former    Types: Cigarettes    Quit date: 11/21/1990    Years since quitting: 32.3   Smokeless tobacco: Never  Vaping Use   Vaping Use: Never used  Substance Use Topics   Alcohol use: No   Drug use: No   History reviewed. No pertinent family history. Allergies  Allergen Reactions   Egg-Derived Products Other (See Comments)    Unknown reaction-patient states she was allergic to egg yolks when she was little. She can eat eggs now, but does not take flu shot or other egg-containing medications.   Penicillins Other (See Comments)    Unknown-reaction as a child     Pollen Extract       ROS Negative unless stated above    Objective:     BP 126/80   Pulse 89   Temp 98.9 F (37.2 C) (Temporal)   Resp 18   Ht 5\' 5"  (1.651 m)   Wt 186 lb 4 oz (84.5 kg)   SpO2 100%   BMI 30.99 kg/m    Physical Exam Constitutional:      General: She is not in acute distress.    Appearance: Normal appearance.  HENT:     Head: Normocephalic and atraumatic.     Comments: Right TM partially occluded with cerumen.  Removed with curette by this provider.  Right ear irrigated.  R TM with effusion, no erythema after irrigation.    Right Ear: Tympanic membrane, ear canal and external ear normal. There is impacted cerumen.     Left Ear: Tympanic membrane, ear canal and external ear normal.     Nose: Nose normal.     Mouth/Throat:     Mouth: Mucous membranes are moist.  Eyes:     Extraocular Movements: Extraocular movements intact.     Conjunctiva/sclera: Conjunctivae normal.  Cardiovascular:     Rate and Rhythm: Normal rate.     Heart sounds: No murmur heard.    No gallop.  Pulmonary:  Effort: Pulmonary effort is normal. No respiratory distress.     Breath sounds: No wheezing, rhonchi or rales.  Skin:    General: Skin is warm and dry.  Neurological:     Mental Status: She is alert and oriented to person, place, and time.    No results found for any visits on 03/24/23.    Assessment & Plan:  Impacted cerumen of right ear -Consent obtained.  Cerumen in right ear removed with curette by this provider.  Right ear irrigated.  Patient tolerated procedure well.  Controlled type 2 diabetes mellitus without complication, without long-term current use of insulin (HCC) -Hemoglobin A1c 7.0% on 02/10/2023 -Continue lifestyle modifications -Continue current medications including glipizide 5 mg twice daily, metformin 1000 mg twice daily. -Continue ARB and statin  Acute effusion of right ear -P.o. antihistamine as needed  Return if symptoms worsen or fail to  improve.   Deeann Saint, MD

## 2023-04-23 ENCOUNTER — Encounter (INDEPENDENT_AMBULATORY_CARE_PROVIDER_SITE_OTHER): Payer: Self-pay

## 2023-05-14 ENCOUNTER — Ambulatory Visit (INDEPENDENT_AMBULATORY_CARE_PROVIDER_SITE_OTHER): Payer: Medicare HMO | Admitting: Family Medicine

## 2023-05-14 ENCOUNTER — Encounter: Payer: Self-pay | Admitting: Family Medicine

## 2023-05-14 VITALS — BP 118/74 | HR 82 | Temp 98.8°F | Wt 182.2 lb

## 2023-05-14 DIAGNOSIS — I10 Essential (primary) hypertension: Secondary | ICD-10-CM | POA: Diagnosis not present

## 2023-05-14 DIAGNOSIS — E119 Type 2 diabetes mellitus without complications: Secondary | ICD-10-CM

## 2023-05-14 NOTE — Patient Instructions (Signed)
Keep up the good work.  You have lost 4 pounds since her last visit on 03/24/2023.  At that time your blood pressure was 126/180, weight 186 lbs.

## 2023-05-14 NOTE — Progress Notes (Signed)
Established Patient Office Visit   Subjective  Patient ID: Jeanne [name], female    DOB: Jan 05, 1956  Age: 67 y.o. MRN: [id number]  Chief Complaint  Patient presents with   Medical Management of Chronic Issues    DM BP    Patient is a 67 year old female seen for follow-up.  Patient states she has been doing well.  Exercising at the [facility] several days out of the week and swimming daily.  Patient states her friend is helping being her accountability partner.  Patient also making diet changes.  Has lost 4 pounds since last OFV.  Blood sugar readings between 89 and 157, with the majority of readings in the 1 teens to 130s.     Past Medical History:  Diagnosis Date   Anemia    Arthritis    Diabetes mellitus    Hypercholesteremia 2011?   Hypertension    NSTEMI (non-ST elevated myocardial infarction) [facility]) December 2013   negative cath, normal LV function - felt to be vasospasm   Thyroid disease    Past Surgical History:  Procedure Laterality Date   CARDIAC CATHETERIZATION  December 2013   normal coronaries and normal LV function   FOOT SURGERY     LEFT HEART CATHETERIZATION WITH CORONARY ANGIOGRAM N/A 09/22/2012   Procedure: LEFT HEART CATHETERIZATION WITH CORONARY ANGIOGRAM;  Surgeon: Tonny Bollman, MD;  Location: [facility] CATH LAB;  Service: Cardiovascular;  Laterality: N/A;   THYROID SURGERY     Partial removed per pt   Social History   Tobacco Use   Smoking status: Former    Current packs/day: 0.00    Types: Cigarettes    Quit date: 11/21/1990    Years since quitting: 32.4   Smokeless tobacco: Never  Vaping Use   Vaping status: Never Used  Substance Use Topics   Alcohol use: No   Drug use: No   History reviewed. No pertinent family history. Allergies  Allergen Reactions   Egg-Derived Products Other (See Comments)    Unknown reaction-patient states she was allergic to egg yolks when she was little. She can eat eggs now, but does not take flu shot or other  egg-containing medications.   Penicillins Other (See Comments)    Unknown-reaction as a child    Pollen Extract       ROS Negative unless stated above    Objective:     BP 118/74 (BP Location: Left Arm, Patient Position: Sitting, Cuff Size: Normal)   Pulse 82   Temp 98.8 F (37.1 C) (Oral)   Wt 182 lb 3.2 oz (82.6 kg)   SpO2 95%   BMI 30.32 kg/m  BP Readings from Last 3 Encounters:  05/14/23 118/74  03/24/23 126/80  02/10/23 112/74   Wt Readings from Last 3 Encounters:  05/14/23 182 lb 3.2 oz (82.6 kg)  03/24/23 186 lb 4 oz (84.5 kg)  02/10/23 188 lb (85.3 kg)      Physical Exam Constitutional:      General: She is not in acute distress.    Appearance: Normal appearance.  HENT:     Head: Normocephalic and atraumatic.     Nose: Nose normal.     Mouth/Throat:     Mouth: Mucous membranes are moist.  Cardiovascular:     Rate and Rhythm: Normal rate and regular rhythm.     Heart sounds: Normal heart sounds. No murmur heard.    No gallop.  Pulmonary:     Effort: Pulmonary effort is normal. No respiratory  distress.     Breath sounds: Normal breath sounds. No wheezing, rhonchi or rales.  Skin:    General: Skin is warm and dry.  Neurological:     Mental Status: She is alert and oriented to person, place, and time.     No results found for any visits on 05/14/23.    Assessment & Plan:  Controlled type 2 diabetes mellitus without complication, without long-term current use of insulin (HCC)  Essential hypertension  DM controlled.  Last hemoglobin A1c 7.0% on 02/10/2023.  Continue lifestyle modifications.  Continue metformin 1000 mg twice daily and glipizide 5 mg twice daily.  Continue ARB and statin.  Eye exam and foot exam up-to-date.  BP well-controlled.  Continue Coreg 3.125 mg twice daily, Norvasc 5 mg daily, valsartan-hydrochlorothiazide 160-25 mg daily.  May need to adjust medication based on continued progress with weight loss and exercise.  Continue to  monitor.  Return in about 16 weeks (around 09/03/2023), or if symptoms worsen or fail to improve, for diabetes, blood pressure re-check.   Deeann Saint, MD

## 2023-05-27 DIAGNOSIS — H401131 Primary open-angle glaucoma, bilateral, mild stage: Secondary | ICD-10-CM | POA: Diagnosis not present

## 2023-06-12 DIAGNOSIS — L26 Exfoliative dermatitis: Secondary | ICD-10-CM | POA: Diagnosis not present

## 2023-06-12 DIAGNOSIS — L2089 Other atopic dermatitis: Secondary | ICD-10-CM | POA: Diagnosis not present

## 2023-06-20 ENCOUNTER — Other Ambulatory Visit: Payer: Self-pay | Admitting: Family Medicine

## 2023-06-20 ENCOUNTER — Other Ambulatory Visit: Payer: Self-pay

## 2023-06-20 DIAGNOSIS — E1169 Type 2 diabetes mellitus with other specified complication: Secondary | ICD-10-CM

## 2023-06-20 MED ORDER — ACCU-CHEK GUIDE W/DEVICE KIT
1.0000 | PACK | Freq: Every day | 0 refills | Status: AC
Start: 2023-06-20 — End: ?

## 2023-06-20 MED ORDER — BD SWAB SINGLE USE REGULAR PADS: MEDICATED_PAD | 2 refills | Status: AC

## 2023-08-06 ENCOUNTER — Other Ambulatory Visit: Payer: Self-pay | Admitting: Family Medicine

## 2023-08-06 DIAGNOSIS — E782 Mixed hyperlipidemia: Secondary | ICD-10-CM

## 2023-08-06 DIAGNOSIS — E119 Type 2 diabetes mellitus without complications: Secondary | ICD-10-CM

## 2023-08-06 DIAGNOSIS — I10 Essential (primary) hypertension: Secondary | ICD-10-CM

## 2023-08-22 ENCOUNTER — Other Ambulatory Visit: Payer: Self-pay | Admitting: Family Medicine

## 2023-08-22 DIAGNOSIS — I10 Essential (primary) hypertension: Secondary | ICD-10-CM

## 2023-08-29 ENCOUNTER — Other Ambulatory Visit: Payer: Self-pay | Admitting: Family Medicine

## 2023-08-29 DIAGNOSIS — E1169 Type 2 diabetes mellitus with other specified complication: Secondary | ICD-10-CM

## 2023-09-03 ENCOUNTER — Ambulatory Visit: Payer: Medicare HMO | Admitting: Family Medicine

## 2023-09-05 ENCOUNTER — Other Ambulatory Visit (HOSPITAL_COMMUNITY): Payer: Self-pay

## 2023-09-05 ENCOUNTER — Telehealth: Payer: Self-pay | Admitting: Cardiovascular Disease

## 2023-09-05 DIAGNOSIS — I10 Essential (primary) hypertension: Secondary | ICD-10-CM

## 2023-09-05 MED ORDER — CARVEDILOL 3.125 MG PO TABS
3.1250 mg | ORAL_TABLET | Freq: Two times a day (BID) | ORAL | 0 refills | Status: DC
  Filled 2023-09-05: qty 60, 30d supply, fill #0

## 2023-09-05 NOTE — Telephone Encounter (Signed)
*  STAT* If patient is at the pharmacy, call can be transferred to refill team.   1. Which medications need to be refilled? (please list name of each medication and dose if known) carvedilol (COREG) 3.125 MG tablet  2. Which pharmacy/location (including street and city if local pharmacy) is medication to be sent to? [id number] [place], [place] - [address]  3. Do they need a 30 day or 90 day supply?   1 week supply  Patient states she has been out of medication for 1 week. She states she hasn't received her prescription from [facility].

## 2023-09-06 ENCOUNTER — Other Ambulatory Visit (HOSPITAL_COMMUNITY): Payer: Self-pay

## 2023-09-08 ENCOUNTER — Ambulatory Visit (INDEPENDENT_AMBULATORY_CARE_PROVIDER_SITE_OTHER): Payer: Medicare HMO | Admitting: Family Medicine

## 2023-09-08 ENCOUNTER — Encounter: Payer: Self-pay | Admitting: Family Medicine

## 2023-09-08 VITALS — BP 120/70 | HR 91 | Temp 98.7°F | Ht 65.0 in | Wt 179.4 lb

## 2023-09-08 DIAGNOSIS — E890 Postprocedural hypothyroidism: Secondary | ICD-10-CM

## 2023-09-08 DIAGNOSIS — Z7984 Long term (current) use of oral hypoglycemic drugs: Secondary | ICD-10-CM

## 2023-09-08 DIAGNOSIS — E119 Type 2 diabetes mellitus without complications: Secondary | ICD-10-CM

## 2023-09-08 DIAGNOSIS — Z0000 Encounter for general adult medical examination without abnormal findings: Secondary | ICD-10-CM

## 2023-09-08 DIAGNOSIS — E782 Mixed hyperlipidemia: Secondary | ICD-10-CM

## 2023-09-08 DIAGNOSIS — Z780 Asymptomatic menopausal state: Secondary | ICD-10-CM | POA: Diagnosis not present

## 2023-09-08 DIAGNOSIS — I10 Essential (primary) hypertension: Secondary | ICD-10-CM | POA: Diagnosis not present

## 2023-09-08 LAB — MICROALBUMIN / CREATININE URINE RATIO
Creatinine,U: 46.2 mg/dL
Microalb Creat Ratio: 1.5 mg/g (ref 0.0–30.0)
Microalb, Ur: <0.7 mg/dL (ref 0.0–1.9)

## 2023-09-08 NOTE — Progress Notes (Signed)
Established Patient Office Visit   Subjective  Patient ID: Jeanne [name], female    DOB: Oct 13, 1955  Age: 67 y.o. MRN: [id number]  Chief Complaint  Patient presents with   Medical Management of Chronic Issues    Pt is a 67 yo female seen for CPE and follow-up on chronic conditions.  Patient states has been doing well overall.  Patient began the first time grandmother 1 week ago to a grandson.  Patient has been traveling to [place] to visit him.  Patient states she has been without Coreg 3.125 mg twice daily x 1 week.  States she never received Rx for mail order pharmacy though they said it was delivered.  Prescription represcribed however sent to incorrect pharmacy.  Patient plans to schedule mammogram    Patient Active Problem List   Diagnosis Date Noted   Coronary vasospasm (HCC) 08/20/2021   Mixed hyperlipidemia 06/27/2021   Postoperative hypothyroidism 06/27/2021   Type 2 diabetes mellitus with hyperlipidemia (HCC)    Essential hypertension 09/27/2013   RBBB 09/27/2013   NSTEMI (non-ST elevated myocardial infarction) (HCC) 09/21/2012   Past Medical History:  Diagnosis Date   Anemia    Arthritis    Diabetes mellitus    Hypercholesteremia 2011?   Hypertension    NSTEMI (non-ST elevated myocardial infarction) [facility]) December 2013   negative cath, normal LV function - felt to be vasospasm   Thyroid disease    Past Surgical History:  Procedure Laterality Date   CARDIAC CATHETERIZATION  December 2013   normal coronaries and normal LV function   FOOT SURGERY     LEFT HEART CATHETERIZATION WITH CORONARY ANGIOGRAM N/A 09/22/2012   Procedure: LEFT HEART CATHETERIZATION WITH CORONARY ANGIOGRAM;  Surgeon: Tonny Bollman, MD;  Location: [facility] CATH LAB;  Service: Cardiovascular;  Laterality: N/A;   THYROID SURGERY     Partial removed per pt   Social History   Tobacco Use   Smoking status: Former    Current packs/day: 0.00    Types: Cigarettes    Quit date: 11/21/1990     Years since quitting: 32.8   Smokeless tobacco: Never  Vaping Use   Vaping status: Never Used  Substance Use Topics   Alcohol use: No   Drug use: No   History reviewed. No pertinent family history. Allergies  Allergen Reactions   Egg-Derived Products Other (See Comments)    Unknown reaction-patient states she was allergic to egg yolks when she was little. She can eat eggs now, but does not take flu shot or other egg-containing medications.   Penicillins Other (See Comments)    Unknown-reaction as a child    Pollen Extract       ROS Negative unless stated above    Objective:     BP 120/70 (BP Location: Left Arm, Patient Position: Sitting, Cuff Size: Large)   Pulse 91   Temp 98.7 F (37.1 C) (Oral)   Ht 5\' 5"  (1.651 m)   Wt 179 lb 6.4 oz (81.4 kg)   SpO2 98%   BMI 29.85 kg/m  BP Readings from Last 3 Encounters:  09/08/23 120/70  05/14/23 118/74  03/24/23 126/80   Wt Readings from Last 3 Encounters:  09/08/23 179 lb 6.4 oz (81.4 kg)  05/14/23 182 lb 3.2 oz (82.6 kg)  03/24/23 186 lb 4 oz (84.5 kg)      Physical Exam Constitutional:      Appearance: Normal appearance.  HENT:     Head: Normocephalic and atraumatic.  Right Ear: Tympanic membrane, ear canal and external ear normal.     Left Ear: Tympanic membrane, ear canal and external ear normal.     Nose: Nose normal.     Mouth/Throat:     Mouth: Mucous membranes are moist.     Pharynx: No oropharyngeal exudate or posterior oropharyngeal erythema.  Eyes:     General: No scleral icterus.    Extraocular Movements: Extraocular movements intact.     Conjunctiva/sclera: Conjunctivae normal.     Pupils: Pupils are equal, round, and reactive to light.  Neck:     Thyroid: No thyromegaly.  Cardiovascular:     Rate and Rhythm: Normal rate and regular rhythm.     Pulses: Normal pulses.     Heart sounds: Normal heart sounds. No murmur heard.    No friction rub.  Pulmonary:     Effort: Pulmonary effort is  normal.     Breath sounds: Normal breath sounds. No wheezing, rhonchi or rales.  Abdominal:     General: Bowel sounds are normal.     Palpations: Abdomen is soft.     Tenderness: There is no abdominal tenderness.  Musculoskeletal:        General: No deformity. Normal range of motion.  Lymphadenopathy:     Cervical: No cervical adenopathy.  Skin:    General: Skin is warm and dry.     Findings: No lesion.  Neurological:     General: No focal deficit present.     Mental Status: She is alert and oriented to person, place, and time.  Psychiatric:        Mood and Affect: Mood normal.        Thought Content: Thought content normal.      No results found for any visits on 09/08/23.    Assessment & Plan:  Well adult exam -Age-appropriate health screenings discussed -Obtain labs -Colonoscopy done 08/20/2017 -Mammogram done 12//23.  Patient to call and schedule -Immunizations reviewed -Pap done 08/26/2022 with OB[facility] at [facility] -Will obtain DEXA scan -Next CPE in 1 year -     CBC with Differential/Platelet; Future -     Comprehensive metabolic panel; Future -     Hemoglobin A1c; Future -     Lipid panel; Future -     TSH; Future -     DG Bone Density; Future  Controlled type 2 diabetes mellitus without complication, without long-term current use of insulin (HCC) -Hemoglobin A1c 7% on 02/10/2023 -Continue metformin 1000 mg twice daily, glipizide 5 mg twice daily -Foot exam up-to-date done 5/24 -Eye exam done 09/2022 -Continue ARB and statin -     Hemoglobin A1c; Future -     Microalbumin / creatinine urine ratio  Essential hypertension -Controlled despite being off Coreg x 1 week -Patient [facility] [address] however pharmacy out of stock when patient called.  Will see if [facility] can be [facility] another [facility].  Contact cardiology for further issues with meds. -Continue current medications including Coreg 3.125 mg twice  daily, Norvasc 5 mg daily, valsartan-hydrochlorothiazide 160-25 mg daily -     CBC with Differential/Platelet; Future -     Comprehensive metabolic panel; Future -     TSH; Future  Mixed hyperlipidemia -Continue pravastatin 10 mg -Lifestyle modifications -     Comprehensive metabolic panel; Future -     Lipid panel; Future  Asymptomatic postmenopausal state -     VITAMIN D 25  Hydroxy (Vit-D Deficiency, Fractures); Future -     DG Bone Density; Future  Postoperative hypothyroidism -Continue Synthroid 75 mcg daily -     TSH; Future    Return in about 4 months (around 01/07/2024).   Deeann Saint, MD

## 2023-09-08 NOTE — Patient Instructions (Addendum)
Congratulations on your new grandson.  It appears that [facility] [address] [facility]23 by cardiology.  Referral for bone density scan was placed.  They will contact you about setting up this appointment.

## 2023-09-09 LAB — LIPID PANEL
Cholesterol: 143 mg/dL (ref 0–200)
HDL: 51.3 mg/dL (ref >39.00)
LDL Cholesterol: 79 mg/dL (ref 0–99)
NonHDL: 91.25
Total CHOL/HDL Ratio: 3
Triglycerides: 61 mg/dL (ref 0.0–149.0)
VLDL: 12.2 mg/dL (ref 0.0–40.0)

## 2023-09-09 LAB — CBC WITH DIFFERENTIAL/PLATELET
Basophils Absolute: 0 10*3/uL (ref 0.0–0.1)
Basophils Relative: 0.6 % (ref 0.0–3.0)
Eosinophils Absolute: 0 10*3/uL (ref 0.0–0.7)
Eosinophils Relative: 0.7 % (ref 0.0–5.0)
HCT: 37.7 % (ref 36.0–46.0)
Hemoglobin: 12.3 g/dL (ref 12.0–15.0)
Lymphocytes Relative: 39.1 % (ref 12.0–46.0)
Lymphs Abs: 2.5 10*3/uL (ref 0.7–4.0)
MCHC: 32.6 g/dL (ref 30.0–36.0)
MCV: 90.5 fL (ref 78.0–100.0)
Monocytes Absolute: 0.4 10*3/uL (ref 0.1–1.0)
Monocytes Relative: 6.6 % (ref 3.0–12.0)
Neutro Abs: 3.4 10*3/uL (ref 1.4–7.7)
Neutrophils Relative %: 53 % (ref 43.0–77.0)
Platelets: 314 10*3/uL (ref 150.0–400.0)
RBC: 4.17 Mil/uL (ref 3.87–5.11)
RDW: 14.7 % (ref 11.5–15.5)
WBC: 6.5 10*3/uL (ref 4.0–10.5)

## 2023-09-09 LAB — COMPREHENSIVE METABOLIC PANEL
ALT: 14 U/L (ref 0–35)
AST: 13 U/L (ref 0–37)
Albumin: 4.6 g/dL (ref 3.5–5.2)
Alkaline Phosphatase: 55 U/L (ref 39–117)
BUN: 13 mg/dL (ref 6–23)
CO2: 29 meq/L (ref 19–32)
Calcium: 9.6 mg/dL (ref 8.4–10.5)
Chloride: 101 meq/L (ref 96–112)
Creatinine, Ser: 0.74 mg/dL (ref 0.40–1.20)
GFR: 83.31 mL/min (ref >60.00)
Glucose, Bld: 71 mg/dL (ref 70–99)
Potassium: 3.2 meq/L — ABNORMAL LOW (ref 3.5–5.1)
Sodium: 141 meq/L (ref 135–145)
Total Bilirubin: 0.6 mg/dL (ref 0.2–1.2)
Total Protein: 7.1 g/dL (ref 6.0–8.3)

## 2023-09-09 LAB — TSH: TSH: 0.47 u[IU]/mL (ref 0.35–5.50)

## 2023-09-09 LAB — HEMOGLOBIN A1C: Hgb A1c MFr Bld: 7 % — ABNORMAL HIGH (ref 4.6–6.5)

## 2023-09-09 LAB — VITAMIN D 25 HYDROXY (VIT D DEFICIENCY, FRACTURES): VITD: 47.21 ng/mL (ref 30.00–100.00)

## 2023-09-12 ENCOUNTER — Other Ambulatory Visit: Payer: Self-pay | Admitting: Family Medicine

## 2023-09-12 ENCOUNTER — Encounter: Payer: Self-pay | Admitting: Family Medicine

## 2023-09-12 DIAGNOSIS — E876 Hypokalemia: Secondary | ICD-10-CM

## 2023-09-12 MED ORDER — POTASSIUM CHLORIDE CRYS ER 20 MEQ PO TBCR: EXTENDED_RELEASE_TABLET | ORAL | 0 refills | Status: DC

## 2023-09-16 ENCOUNTER — Other Ambulatory Visit (HOSPITAL_COMMUNITY): Payer: Self-pay

## 2023-09-23 ENCOUNTER — Encounter: Payer: Self-pay | Admitting: Family Medicine

## 2023-09-29 ENCOUNTER — Other Ambulatory Visit: Payer: Self-pay | Admitting: Family Medicine

## 2023-09-29 DIAGNOSIS — I10 Essential (primary) hypertension: Secondary | ICD-10-CM

## 2023-09-29 DIAGNOSIS — E890 Postprocedural hypothyroidism: Secondary | ICD-10-CM

## 2023-09-30 DIAGNOSIS — E119 Type 2 diabetes mellitus without complications: Secondary | ICD-10-CM | POA: Diagnosis not present

## 2023-10-02 ENCOUNTER — Other Ambulatory Visit: Payer: Self-pay | Admitting: Family Medicine

## 2023-10-02 DIAGNOSIS — E890 Postprocedural hypothyroidism: Secondary | ICD-10-CM

## 2023-10-02 NOTE — Telephone Encounter (Signed)
 Copied from CRM [id number]. Topic: Clinical - Medication Refill >> Oct 02, 2023 11:40 AM Joanell NOVAK wrote: Most Recent Primary Care Visit:  Provider: MERCER KIRSCH R  Department: [facility]  Visit Type: PHYSICAL  Date: 09/08/2023  Medication: ***  Has the patient contacted their pharmacy?  (Agent: If no, request that the patient contact the pharmacy for the refill. If patient does not wish to contact the pharmacy document the reason why and proceed with request.) (Agent: If yes, when and what did the pharmacy advise?)  Is this the correct pharmacy for this prescription?  If no, delete pharmacy and type the correct one.  This is the patient's preferred pharmacy:  [facility] [address] [place] - [address] [place] [address] Phone: [phone] Fax: [phone]  [facility] - [address], [place] - [address] [place] [address] Phone: [phone] Fax: [phone]   Has the prescription been filled recently?   Is the patient out of the medication?   Has the patient been seen for an appointment in the last year OR does the patient have an upcoming appointment?   Can we respond through MyChart?   Agent: Please be advised that Rx refills may take up to 3 business days. We ask that you follow-up with your pharmacy.

## 2023-10-04 ENCOUNTER — Other Ambulatory Visit: Payer: Self-pay | Admitting: Family Medicine

## 2023-10-04 DIAGNOSIS — E876 Hypokalemia: Secondary | ICD-10-CM

## 2023-10-06 DIAGNOSIS — Z1239 Encounter for other screening for malignant neoplasm of breast: Secondary | ICD-10-CM | POA: Diagnosis not present

## 2023-10-06 DIAGNOSIS — Z1231 Encounter for screening mammogram for malignant neoplasm of breast: Secondary | ICD-10-CM | POA: Diagnosis not present

## 2023-10-06 DIAGNOSIS — Z780 Asymptomatic menopausal state: Secondary | ICD-10-CM | POA: Diagnosis not present

## 2023-10-06 DIAGNOSIS — Z6829 Body mass index (BMI) 29.0-29.9, adult: Secondary | ICD-10-CM | POA: Diagnosis not present

## 2023-10-06 DIAGNOSIS — Z01419 Encounter for gynecological examination (general) (routine) without abnormal findings: Secondary | ICD-10-CM | POA: Diagnosis not present

## 2023-10-07 ENCOUNTER — Other Ambulatory Visit: Payer: Medicare HMO

## 2023-10-08 ENCOUNTER — Ambulatory Visit: Payer: Medicare HMO | Attending: Cardiovascular Disease | Admitting: Cardiovascular Disease

## 2023-10-08 ENCOUNTER — Ambulatory Visit (INDEPENDENT_AMBULATORY_CARE_PROVIDER_SITE_OTHER)
Admission: RE | Admit: 2023-10-08 | Discharge: 2023-10-08 | Disposition: A | Discharge status: Home / Self Care | Payer: Medicare HMO | Source: Ambulatory Visit | Attending: Family Medicine | Admitting: Family Medicine

## 2023-10-08 ENCOUNTER — Encounter: Payer: Self-pay | Admitting: Cardiovascular Disease

## 2023-10-08 VITALS — BP 100/70 | HR 66 | Ht 65.0 in | Wt 182.2 lb

## 2023-10-08 DIAGNOSIS — I4510 Unspecified right bundle-branch block: Secondary | ICD-10-CM

## 2023-10-08 DIAGNOSIS — Z13820 Encounter for screening for osteoporosis: Secondary | ICD-10-CM | POA: Diagnosis not present

## 2023-10-08 DIAGNOSIS — I10 Essential (primary) hypertension: Secondary | ICD-10-CM

## 2023-10-08 DIAGNOSIS — Z780 Asymptomatic menopausal state: Secondary | ICD-10-CM | POA: Diagnosis not present

## 2023-10-08 DIAGNOSIS — I201 Angina pectoris with documented spasm: Secondary | ICD-10-CM

## 2023-10-08 MED ORDER — CARVEDILOL 3.125 MG PO TABS: 3.1250 mg | ORAL_TABLET | Freq: Two times a day (BID) | ORAL | 3 refills | Status: DC

## 2023-10-08 NOTE — Progress Notes (Signed)
 Chief Complaint  Patient presents with   Follow-up    Coronary vasospasm   History of Present Illness: 68 yo female with history of HTN, HLD, DM and possible coronary vasospasm who is here today for cardiac follow up. She presented with chest pain and elevated troponin in December 2013. Cardiac cath 09/22/12 showed angiographically normal coronary arteries. It was felt that her presentation was consistent with coronary vasospasm vs myocarditis. Her LV function was normal.    She is here today for follow up. The patient denies any chest pain, dyspnea, palpitations, lower extremity edema, orthopnea, PND, dizziness, near syncope or syncope.   Primary Care Physician: Viola Greulich, MD  Past Medical History:  Diagnosis Date   Anemia    Arthritis    Diabetes mellitus    Hypercholesteremia 2011?   Hypertension    NSTEMI (non-ST elevated myocardial infarction) [facility]) December 2013   negative cath, normal LV function - felt to be vasospasm   Thyroid  disease     Past Surgical History:  Procedure Laterality Date   CARDIAC CATHETERIZATION  December 2013   normal coronaries and normal LV function   FOOT SURGERY     LEFT HEART CATHETERIZATION WITH CORONARY ANGIOGRAM N/A 09/22/2012   Procedure: LEFT HEART CATHETERIZATION WITH CORONARY ANGIOGRAM;  Surgeon: Arnoldo Lapping, MD;  Location: [facility] CATH LAB;  Service: Cardiovascular;  Laterality: N/A;   THYROID  SURGERY     Partial removed per pt    Current Outpatient Medications  Medication Sig Dispense Refill   Accu-Chek FastClix Lancets MISC TEST BLOOD SUGAR DAILY 102 each 5   ACCU-CHEK GUIDE test strip TEST BLOOD SUGAR ONE TIME DAILY 100 strip 3   Alcohol Swabs (B-D SINGLE USE SWABS REGULAR) PADS To use with Glucose monitoring 60 each 2   amLODipine  (NORVASC ) 5 MG tablet TAKE 1 TABLET EVERY DAY 90 tablet 3   aspirin  81 MG tablet Take 81 mg by mouth daily.     blood glucose meter kit and supplies KIT Dispense based on patient and insurance  preference. Use up to four times daily as directed. (FOR ICD-9 250.00, 250.01). 1 each 0   Blood Glucose Monitoring Suppl (ACCU-CHEK GUIDE) w/Device KIT 1 kit by Does not apply route daily. 1 kit 0   CALCIUM  PO Take 1 tablet by mouth daily. Chewable calcium      glipiZIDE  (GLUCOTROL ) 5 MG tablet TAKE 1 TABLET (5 MG TOTAL) BY MOUTH TWICE A DAY BEFORE MEALS 180 tablet 1   levothyroxine  (SYNTHROID ) 75 MCG tablet TAKE 1 TABLET EVERY MORNING ON AN EMPTY STOMACH 90 tablet 3   metFORMIN  (GLUCOPHAGE ) 500 MG tablet TAKE 2 TABLETS TWICE DAILY 360 tablet 3   nitroGLYCERIN  (NITROSTAT ) 0.4 MG SL tablet Place 1 tablet (0.4 mg total) under the tongue every 5 (five) minutes x 3 doses as needed for chest pain. 25 tablet 6   potassium chloride  SA (KLOR-CON  M) 20 MEQ tablet Take 1 tablet (20 mEq total) by mouth daily. Take 2 tabs (40 mEq) daily for 3 days.  Then take 1 tab (20 mEq) daily. 90 tablet 3   pravastatin  (PRAVACHOL ) 10 MG tablet TAKE 1 TABLET EVERY DAY 90 tablet 3   valsartan -hydrochlorothiazide  (DIOVAN -HCT) 160-25 MG tablet TAKE 1 TABLET EVERY DAY 90 tablet 3   carvedilol  (COREG ) 3.125 MG tablet Take 1 tablet (3.125 mg total) by mouth 2 (two) times daily with a meal. 180 tablet 3   No current facility-administered medications for this visit.    Allergies  Allergen  Reactions   Egg-Derived Products Other (See Comments)    Unknown reaction-patient states she was allergic to egg yolks when she was little. She can eat eggs now, but does not take flu shot or other egg-containing medications.   Penicillins Other (See Comments)    Unknown-reaction as a child    Pollen Extract     Social History   Socioeconomic History   Marital status: Single    Spouse name: Not on file   Number of children: Not on file   Years of education: Not on file   Highest education level: Bachelor's degree (e.g., BA, AB, BS)  Occupational History   Not on file  Tobacco Use   Smoking status: Former    Current packs/day:  0.00    Types: Cigarettes    Quit date: 11/21/1990    Years since quitting: 32.9   Smokeless tobacco: Never  Vaping Use   Vaping status: Never Used  Substance and Sexual Activity   Alcohol use: No   Drug use: No   Sexual activity: Yes  Other Topics Concern   Not on file  Social History Narrative   Not on file   Social Drivers of Health   Financial Resource Strain: Low Risk  (09/07/2023)   Overall Financial Resource Strain (CARDIA)    Difficulty of Paying Living Expenses: Not hard at all  Food Insecurity: No Food Insecurity (09/07/2023)   Hunger Vital Sign    Worried About Running Out of Food in the Last Year: Never true    Ran Out of Food in the Last Year: Never true  Transportation Needs: No Transportation Needs (09/07/2023)   PRAPARE - Administrator, Civil Service (Medical): No    Lack of Transportation (Non-Medical): No  Physical Activity: Sufficiently Active (09/07/2023)   Exercise Vital Sign    Days of Exercise per Week: 4 days    Minutes of Exercise per Session: 40 min  Stress: No Stress Concern Present (09/07/2023)   [employer] of Occupational Health - Occupational Stress Questionnaire    Feeling of Stress : Only a little  Social Connections: Moderately Integrated (09/07/2023)   Social Connection and Isolation Panel [NHANES]    Frequency of Communication with Friends and Family: More than three times a week    Frequency of Social Gatherings with Friends and Family: Three times a week    Attends Religious Services: More than 4 times per year    Active Member of Clubs or Organizations: Yes    Attends Banker Meetings: More than 4 times per year    Marital Status: Divorced  Intimate Partner Violence: Not At Risk (10/25/2022)   Humiliation, Afraid, Rape, and Kick questionnaire    Fear of Current or Ex-Partner: No    Emotionally Abused: No    Physically Abused: No    Sexually Abused: No    History reviewed. No pertinent family  history.  Review of Systems:  As stated in the HPI and otherwise negative.   BP 100/70   Pulse 66   Ht 5\' 5"  (1.651 m)   Wt 82.6 kg   SpO2 99%   BMI 30.32 kg/m   Physical Examination: General: Well developed, well nourished, NAD  HEENT: OP clear, mucus membranes moist  SKIN: warm, dry. No rashes. Neuro: No focal deficits  Musculoskeletal: Muscle strength 5/5 all ext  Psychiatric: Mood and affect normal  Neck: No JVD, no carotid bruits, no thyromegaly, no lymphadenopathy.  Lungs:Clear bilaterally, no wheezes, rhonci,  crackles Cardiovascular: Regular rate and rhythm. No murmurs, gallops or rubs. Abdomen:Soft. Bowel sounds present. Non-tender.  Extremities: No lower extremity edema. Pulses are 2 + in the bilateral DP/PT.  EKG:  EKG is ordered today. The ekg ordered today demonstrates   Recent Labs: 09/08/2023: ALT 14; BUN 13; Creatinine, Ser 0.74; Hemoglobin 12.3; Platelets 314.0; Potassium 3.2; Sodium 141; TSH 0.47   Lipid Panel Followed in primary care   Wt Readings from Last 3 Encounters:  10/08/23 82.6 kg  09/08/23 81.4 kg  05/14/23 82.6 kg    Assessment and Plan:   1. Coronary vasospasm: Possible coronary vasospasm in 2013. She has done well with medical therapy with no recurrent chest pain. Continue ASA, Coreg  and Norvasc .    2. HTN: BP is controlled. No changes  3. RBBB: Chronic  Labs/ tests ordered today include:  Orders Placed This Encounter  Procedures   EKG 12-Lead   Disposition:   F/U with me in 12  months  Signed, Antoinette Batman, MD 10/08/2023 11:21 AM    [facility] [address] [place], [place], [place]  [address] Phone: [phone]; Fax: [phone]

## 2023-10-08 NOTE — Patient Instructions (Signed)

## 2023-10-09 ENCOUNTER — Encounter: Payer: Self-pay | Admitting: Family Medicine

## 2023-10-12 NOTE — Progress Notes (Deleted)
No chief complaint on file.  History of Present Illness: 68 yo female with history of HTN, HLD, DM and possible coronary vasospasm who is here today for cardiac follow up. She presented with chest pain and elevated troponin in December 2013. Cardiac cath 09/22/12 showed angiographically normal coronary arteries. It was felt that her presentation was consistent with coronary vasospasm vs myocarditis. Her LV function was normal.    She is here today for follow up. The patient denies any chest pain, dyspnea, palpitations, lower extremity edema, orthopnea, PND, dizziness, near syncope or syncope.   Primary Care Physician: Deeann Saint, MD  Past Medical History:  Diagnosis Date   Anemia    Arthritis    Diabetes mellitus    Hypercholesteremia 2011?   Hypertension    NSTEMI (non-ST elevated myocardial infarction) [facility]) December 2013   negative cath, normal LV function - felt to be vasospasm   Thyroid disease     Past Surgical History:  Procedure Laterality Date   CARDIAC CATHETERIZATION  December 2013   normal coronaries and normal LV function   FOOT SURGERY     LEFT HEART CATHETERIZATION WITH CORONARY ANGIOGRAM N/A 09/22/2012   Procedure: LEFT HEART CATHETERIZATION WITH CORONARY ANGIOGRAM;  Surgeon: Tonny Bollman, MD;  Location: [facility] CATH LAB;  Service: Cardiovascular;  Laterality: N/A;   THYROID SURGERY     Partial removed per pt    Current Outpatient Medications  Medication Sig Dispense Refill   Accu-Chek FastClix Lancets MISC TEST BLOOD SUGAR DAILY 102 each 5   ACCU-CHEK GUIDE test strip TEST BLOOD SUGAR ONE TIME DAILY 100 strip 3   Alcohol Swabs (B-D SINGLE USE SWABS REGULAR) PADS To use with Glucose monitoring 60 each 2   amLODipine (NORVASC) 5 MG tablet TAKE 1 TABLET EVERY DAY 90 tablet 3   aspirin 81 MG tablet Take 81 mg by mouth daily.     blood glucose meter kit and supplies KIT Dispense based on patient and insurance preference. Use up to four times daily as directed.  (FOR ICD-9 250.00, 250.01). 1 each 0   Blood Glucose Monitoring Suppl (ACCU-CHEK GUIDE) w/Device KIT 1 kit by Does not apply route daily. 1 kit 0   CALCIUM PO Take 1 tablet by mouth daily. Chewable calcium     carvedilol (COREG) 3.125 MG tablet Take 1 tablet (3.125 mg total) by mouth 2 (two) times daily with a meal. 180 tablet 3   glipiZIDE (GLUCOTROL) 5 MG tablet TAKE 1 TABLET (5 MG TOTAL) BY MOUTH TWICE A DAY BEFORE MEALS 180 tablet 1   levothyroxine (SYNTHROID) 75 MCG tablet TAKE 1 TABLET EVERY MORNING ON AN EMPTY STOMACH 90 tablet 3   metFORMIN (GLUCOPHAGE) 500 MG tablet TAKE 2 TABLETS TWICE DAILY 360 tablet 3   nitroGLYCERIN (NITROSTAT) 0.4 MG SL tablet Place 1 tablet (0.4 mg total) under the tongue every 5 (five) minutes x 3 doses as needed for chest pain. 25 tablet 6   potassium chloride SA (KLOR-CON M) 20 MEQ tablet Take 1 tablet (20 mEq total) by mouth daily. Take 2 tabs (40 mEq) daily for 3 days.  Then take 1 tab (20 mEq) daily. 90 tablet 3   pravastatin (PRAVACHOL) 10 MG tablet TAKE 1 TABLET EVERY DAY 90 tablet 3   valsartan-hydrochlorothiazide (DIOVAN-HCT) 160-25 MG tablet TAKE 1 TABLET EVERY DAY 90 tablet 3   No current facility-administered medications for this visit.    Allergies  Allergen Reactions   Egg-Derived Products Other (See Comments)  Unknown reaction-patient states she was allergic to egg yolks when she was little. She can eat eggs now, but does not take flu shot or other egg-containing medications.   Penicillins Other (See Comments)    Unknown-reaction as a child    Pollen Extract     Social History   Socioeconomic History   Marital status: Single    Spouse name: Not on file   Number of children: Not on file   Years of education: Not on file   Highest education level: Bachelor's degree (e.g., BA, AB, BS)  Occupational History   Not on file  Tobacco Use   Smoking status: Former    Current packs/day: 0.00    Types: Cigarettes    Quit date: 11/21/1990     Years since quitting: 32.9   Smokeless tobacco: Never  Vaping Use   Vaping status: Never Used  Substance and Sexual Activity   Alcohol use: No   Drug use: No   Sexual activity: Yes  Other Topics Concern   Not on file  Social History Narrative   Not on file   Social Drivers of Health   Financial Resource Strain: Low Risk  (09/07/2023)   Overall Financial Resource Strain (CARDIA)    Difficulty of Paying Living Expenses: Not hard at all  Food Insecurity: No Food Insecurity (09/07/2023)   Hunger Vital Sign    Worried About Running Out of Food in the Last Year: Never true    Ran Out of Food in the Last Year: Never true  Transportation Needs: No Transportation Needs (09/07/2023)   PRAPARE - Administrator, Civil Service (Medical): No    Lack of Transportation (Non-Medical): No  Physical Activity: Sufficiently Active (09/07/2023)   Exercise Vital Sign    Days of Exercise per Week: 4 days    Minutes of Exercise per Session: 40 min  Stress: No Stress Concern Present (09/07/2023)   [employer] of Occupational Health - Occupational Stress Questionnaire    Feeling of Stress : Only a little  Social Connections: Moderately Integrated (09/07/2023)   Social Connection and Isolation Panel [NHANES]    Frequency of Communication with Friends and Family: More than three times a week    Frequency of Social Gatherings with Friends and Family: Three times a week    Attends Religious Services: More than 4 times per year    Active Member of Clubs or Organizations: Yes    Attends Banker Meetings: More than 4 times per year    Marital Status: Divorced  Intimate Partner Violence: Not At Risk (10/25/2022)   Humiliation, Afraid, Rape, and Kick questionnaire    Fear of Current or Ex-Partner: No    Emotionally Abused: No    Physically Abused: No    Sexually Abused: No    No family history on file.  Review of Systems:  As stated in the HPI and otherwise negative.    There were no vitals taken for this visit.  Physical Examination: General: Well developed, well nourished, NAD  HEENT: OP clear, mucus membranes moist  SKIN: warm, dry. No rashes. Neuro: No focal deficits  Musculoskeletal: Muscle strength 5/5 all ext  Psychiatric: Mood and affect normal  Neck: No JVD, no carotid bruits, no thyromegaly, no lymphadenopathy.  Lungs:Clear bilaterally, no wheezes, rhonci, crackles Cardiovascular: Regular rate and rhythm. No murmurs, gallops or rubs. Abdomen:Soft. Bowel sounds present. Non-tender.  Extremities: No lower extremity edema. Pulses are 2 + in the bilateral DP/PT.  EKG:  EKG is ordered today. The ekg ordered today demonstrates   Recent Labs: 09/08/2023: ALT 14; BUN 13; Creatinine, Ser 0.74; Hemoglobin 12.3; Platelets 314.0; Potassium 3.2; Sodium 141; TSH 0.47   Lipid Panel Followed in primary care   Wt Readings from Last 3 Encounters:  10/08/23 82.6 kg  09/08/23 81.4 kg  05/14/23 82.6 kg    Assessment and Plan:   1. Coronary vasospasm: Possible coronary vasospasm in 2013. She has done well with medical therapy with no recurrent chest pain. Continue ASA, Coreg and Norvasc.    2. HTN: BP is controlled. No changes  3. RBBB: Chronic  Labs/ tests ordered today include:  No orders of the defined types were placed in this encounter.  Disposition:   F/U with me in 12  months  Signed, Verne Carrow, MD 10/12/2023 12:34 PM    [facility] [address] [place], [place], [place]  [address] Phone: [phone]; Fax: [phone]

## 2023-10-13 ENCOUNTER — Ambulatory Visit: Payer: Medicare HMO | Admitting: Cardiovascular Disease

## 2023-10-29 NOTE — Telephone Encounter (Signed)
 Patient's hgb A1c is at goal no indication to change medication at this time.

## 2023-11-17 ENCOUNTER — Ambulatory Visit (INDEPENDENT_AMBULATORY_CARE_PROVIDER_SITE_OTHER): Payer: Medicare HMO

## 2023-11-17 VITALS — BP 120/60 | HR 85 | Temp 98.6°F | Ht 65.0 in | Wt 179.5 lb

## 2023-11-17 DIAGNOSIS — Z0000 Encounter for general adult medical examination without abnormal findings: Secondary | ICD-10-CM

## 2023-11-17 NOTE — Progress Notes (Signed)
 Subjective:   Jeanne [name] is a 68 y.o. female who presents for Medicare Annual (Subsequent) preventive examination.  Visit Complete: In person  Patient Medicare AWV questionnaire was completed by the patient on 11/17/23; I have confirmed that all information answered by patient is correct and no changes since this date.  Cardiac Risk Factors include: advanced age (>93men, >58 women);diabetes mellitus;hypertension     Objective:    Today's Vitals   11/17/23 1544  BP: 120/60  Pulse: 85  Temp: 98.6 F (37 C)  TempSrc: Oral  SpO2: 98%  Weight: 179 lb 8 oz (81.4 kg)  Height: 5\' 5"  (1.651 m)   Body mass index is 29.87 kg/m.     11/17/2023    4:05 PM 10/25/2022    1:37 PM 10/24/2021    1:42 PM 07/14/2021   12:19 PM 09/22/2012    1:00 AM 09/22/2012   12:00 AM  Advanced Directives  Does Patient Have a Medical Advance Directive? Yes Yes Yes No  Patient does not have advance directive;Patient would not like information  Type of Public librarian Power of [place];Living will Healthcare Power of [place];Living will Healthcare Power of [place];Living will     Does patient want to make changes to medical advance directive?   No - Patient declined     Copy of Healthcare Power of Attorney in Chart? No - copy requested No - copy requested No - copy requested     Pre-existing out of facility DNR order (yellow form or pink MOST form)     No No    Current Medications (verified) Outpatient Encounter Medications as of 11/17/2023  Medication Sig   Accu-Chek FastClix Lancets MISC TEST BLOOD SUGAR DAILY   ACCU-CHEK GUIDE test strip TEST BLOOD SUGAR ONE TIME DAILY   Alcohol Swabs (B-D SINGLE USE SWABS REGULAR) PADS To use with Glucose monitoring   amLODipine (NORVASC) 5 MG tablet TAKE 1 TABLET EVERY DAY   aspirin 81 MG tablet Take 81 mg by mouth daily.   blood glucose meter kit and supplies KIT Dispense based on patient and insurance preference. Use up to four times daily  as directed. (FOR ICD-9 250.00, 250.01).   Blood Glucose Monitoring Suppl (ACCU-CHEK GUIDE) w/Device KIT 1 kit by Does not apply route daily.   CALCIUM PO Take 1 tablet by mouth daily. Chewable calcium   carvedilol (COREG) 3.125 MG tablet Take 1 tablet (3.125 mg total) by mouth 2 (two) times daily with a meal.   glipiZIDE (GLUCOTROL) 5 MG tablet TAKE 1 TABLET (5 MG TOTAL) BY MOUTH TWICE A DAY BEFORE MEALS   levothyroxine (SYNTHROID) 75 MCG tablet TAKE 1 TABLET EVERY MORNING ON AN EMPTY STOMACH   metFORMIN (GLUCOPHAGE) 500 MG tablet TAKE 2 TABLETS TWICE DAILY   nitroGLYCERIN (NITROSTAT) 0.4 MG SL tablet Place 1 tablet (0.4 mg total) under the tongue every 5 (five) minutes x 3 doses as needed for chest pain.   potassium chloride SA (KLOR-CON M) 20 MEQ tablet Take 1 tablet (20 mEq total) by mouth daily. Take 2 tabs (40 mEq) daily for 3 days.  Then take 1 tab (20 mEq) daily.   pravastatin (PRAVACHOL) 10 MG tablet TAKE 1 TABLET EVERY DAY   valsartan-hydrochlorothiazide (DIOVAN-HCT) 160-25 MG tablet TAKE 1 TABLET EVERY DAY   No facility-administered encounter medications on file as of 11/17/2023.    Allergies (verified) Egg-derived products, Penicillins, and Pollen extract   History: Past Medical History:  Diagnosis Date   Anemia  Arthritis    Diabetes mellitus    Hypercholesteremia 2011?   Hypertension    NSTEMI (non-ST elevated myocardial infarction) [facility]) December 2013   negative cath, normal LV function - felt to be vasospasm   Thyroid disease    Past Surgical History:  Procedure Laterality Date   CARDIAC CATHETERIZATION  December 2013   normal coronaries and normal LV function   FOOT SURGERY     LEFT HEART CATHETERIZATION WITH CORONARY ANGIOGRAM N/A 09/22/2012   Procedure: LEFT HEART CATHETERIZATION WITH CORONARY ANGIOGRAM;  Surgeon: Tonny Bollman, MD;  Location: [facility] C[facility] LAB;  Service: Cardiovascular;  Laterality: N/A;   THYROID SURGERY     Partial removed per pt   History  reviewed. No pertinent family history. Social History   Socioeconomic History   Marital status: Single    Spouse name: Not on file   Number of children: Not on file   Years of education: Not on file   Highest education level: Bachelor's degree (e.g., BA, AB, BS)  Occupational History   Not on file  Tobacco Use   Smoking status: Former    Current packs/day: 0.00    Types: Cigarettes    Quit date: 11/21/1990    Years since quitting: 33.0   Smokeless tobacco: Never  Vaping Use   Vaping status: Never Used  Substance and Sexual Activity   Alcohol use: No   Drug use: No   Sexual activity: Yes  Other Topics Concern   Not on file  Social History Narrative   Not on file   Social Drivers of Health   Financial Resource Strain: Low Risk  (11/17/2023)   Overall Financial Resource Strain (CARDIA)    Difficulty of Paying Living Expenses: Not hard at all  Food Insecurity: No Food Insecurity (11/17/2023)   Hunger Vital Sign    Worried About Running Out of Food in the Last Year: Never true    Ran Out of Food in the Last Year: Never true  Transportation Needs: No Transportation Needs (11/17/2023)   PRAPARE - Administrator, Civil Service (Medical): No    Lack of Transportation (Non-Medical): No  Physical Activity: Sufficiently Active (11/17/2023)   Exercise Vital Sign    Days of Exercise per Week: 4 days    Minutes of Exercise per Session: 40 min  Stress: No Stress Concern Present (11/17/2023)   [employer] of Occupational Health - Occupational Stress Questionnaire    Feeling of Stress : Not at all  Social Connections: Moderately Integrated (11/17/2023)   Social Connection and Isolation Panel [NHANES]    Frequency of Communication with Friends and Family: More than three times a week    Frequency of Social Gatherings with Friends and Family: More than three times a week    Attends Religious Services: More than 4 times per year    Active Member of [employer] or Organizations:  Yes    Attends Engineer, structural: More than 4 times per year    Marital Status: Divorced    Tobacco Counseling Counseling given: Not Answered   Clinical Intake:  Pre-visit preparation completed: Yes  Pain : No/denies pain     BMI - recorded: 29.87 Nutritional Status: BMI 25 -29 Overweight Nutritional Risks: None Diabetes: Yes CBG done?: No Did pt. bring in CBG monitor from home?: No  How often do you need to have someone help you when you read instructions, pamphlets, or other written materials from your doctor or pharmacy?: 1 - Never  Interpreter Needed?: No  Information entered by :: Theresa Mulligan LPN   Activities of Daily Living    11/17/2023    4:03 PM 11/17/2023    1:05 PM  In your present state of health, do you have any difficulty performing the following activities:  Hearing? 0 0  Vision? 0 0  Difficulty concentrating or making decisions? 0 0  Walking or climbing stairs? 0 0  Dressing or bathing? 0 0  Doing errands, shopping? 0 0  Preparing Food and eating ? N N  Using the Toilet? N N  In the past six months, have you accidently leaked urine? N N  Do you have problems with loss of bowel control? N N  Managing your Medications? N N  Managing your Finances? N N  Housekeeping or managing your Housekeeping? N N    Patient Care Team: Deeann Saint, MD as PCP - General (Family Medicine) Kathleene Hazel, MD as PCP - Cardiology (Cardiology)  Indicate any recent Medical Services you may have received from other than [facility] providers in the past year (date may be approximate).     Assessment:   This is a routine wellness examination for [place].  Hearing/Vision screen Hearing Screening - Comments:: Denies hearing difficulties   Vision Screening - Comments:: Wears rx glasses - up to date with routine eye exams with  [facility]   Goals Addressed               This Visit's Progress     Patient stated (pt-stated)        I  would like to see and play with my grandson more.       Depression Screen    11/17/2023    4:02 PM 09/08/2023    2:22 PM 03/24/2023    1:29 PM 02/10/2023   11:38 AM 11/21/2022    2:37 PM 10/25/2022    1:35 PM 08/12/2022    1:11 PM  PHQ 2/9 Scores  PHQ - 2 Score 0 0 0 1 0 0 0  PHQ- 9 Score  0 0 1   1    Fall Risk    11/17/2023    4:03 PM 11/17/2023    1:05 PM 09/08/2023    1:38 PM 03/24/2023    1:30 PM 02/10/2023   11:38 AM  Fall Risk   Falls in the past year? 0 0 0 0 0  Number falls in past yr: 0  0 0 0  Injury with Fall? 0 0 0 0 0  Risk for fall due to : No Fall Risks  No Fall Risks No Fall Risks No Fall Risks  Follow up Falls prevention discussed;Falls evaluation completed  Falls evaluation completed Falls evaluation completed Falls evaluation completed    MEDICARE RISK AT HOME: Medicare Risk at Home Any stairs in or around the home?: Yes If so, are there any without handrails?: No Home free of loose throw rugs in walkways, pet beds, electrical cords, etc?: No Adequate lighting in your home to reduce risk of falls?: Yes Life alert?: No Use of a cane, walker or w/c?: No Grab bars in the bathroom?: Yes Shower chair or bench in shower?: No Elevated toilet seat or a handicapped toilet?: No  TIMED UP AND GO:  Was the test performed?  Yes  Length of time to ambulate 10 feet: 10 sec Gait steady and fast without use of assistive device    Cognitive Function:  11/17/2023    4:06 PM 10/25/2022    1:37 PM 10/24/2021    1:40 PM  6CIT Screen  What Year? 0 points 0 points 0 points  What month? 0 points 0 points 0 points  What time? 0 points 0 points 0 points  Count back from 20 0 points 0 points 0 points  Months in reverse 0 points 0 points 0 points  Repeat phrase 0 points 0 points 0 points  Total Score 0 points 0 points 0 points    Immunizations Immunization History  Administered Date(s) Administered   Influenza Inj Mdck Quad Pf 06/27/2021   Influenza-Unspecified  07/18/2021   MODERNA COVID-19 SARS-COV-2 PEDS BIVALENT BOOSTER 27yr-92yr 08/03/2021   Moderna Sars-Covid-2 Vaccination 01/22/2020, 03/02/2020, 08/28/2020, 08/03/2021   Tdap 12/05/2013    TDAP status: Up to date  Flu Vaccine status: Due, Education has been provided regarding the importance of this vaccine. Advised may receive this vaccine at local pharmacy or Health Dept. Aware to provide a copy of the vaccination record if obtained from local pharmacy or Health Dept. Verbalized acceptance and understanding.  Pneumococcal vaccine status: Due, Education has been provided regarding the importance of this vaccine. Advised may receive this vaccine at local pharmacy or Health Dept. Aware to provide a copy of the vaccination record if obtained from local pharmacy or Health Dept. Verbalized acceptance and understanding.  Covid-19 vaccine status: Declined, Education has been provided regarding the importance of this vaccine but patient still declined. Advised may receive this vaccine at local pharmacy or Health Dept.or vaccine clinic. Aware to provide a copy of the vaccination record if obtained from local pharmacy or Health Dept. Verbalized acceptance and understanding.  Qualifies for Shingles Vaccine? Yes   Zostavax completed No   Shingrix Completed?: No.    Education has been provided regarding the importance of this vaccine. Patient has been advised to call insurance company to determine out of pocket expense if they have not yet received this vaccine. Advised may also receive vaccine at local pharmacy or Health Dept. Verbalized acceptance and understanding.  Screening Tests Health Maintenance  Topic Date Due   Pneumonia Vaccine 50+ Years old (1 of 2 - PCV) Never done   COVID-19 Vaccine (6 - 2024-25 season) 05/25/2023   OPHTHALMOLOGY EXAM  10/02/2023   INFLUENZA VACCINE  12/22/2023 (Originally 04/24/2023)   Zoster Vaccines- Shingrix (1 of 2) 02/10/2024 (Originally 07/30/2005)   DTaP/Tdap/Td (2 - Td  or Tdap) 12/06/2023   FOOT EXAM  02/10/2024   HEMOGLOBIN A1C  03/08/2024   MAMMOGRAM  08/22/2024   Diabetic kidney evaluation - eGFR measurement  09/07/2024   Diabetic kidney evaluation - Urine ACR  09/07/2024   Medicare Annual Wellness (AWV)  11/16/2024   Colonoscopy  08/21/2027   DEXA SCAN  Completed   Hepatitis C Screening  Completed   HPV VACCINES  Aged Out    Health Maintenance  Health Maintenance Due  Topic Date Due   Pneumonia Vaccine 36+ Years old (1 of 2 - PCV) Never done   COVID-19 Vaccine (6 - 2024-25 season) 05/25/2023   OPHTHALMOLOGY EXAM  10/02/2023    Colorectal cancer screening: Type of screening: Colonoscopy. Completed 08/20/17. Repeat every 10 years  Mammogram status: Completed 08/22/22. Repeat every year  Bone Density status: Completed 10/08/23. Results reflect: Bone density results: NORMAL. Repeat every   years.     Additional Screening:  Hepatitis C Screening: does qualify; Completed 03/24/23  Vision Screening: Recommended annual ophthalmology exams for early detection of glaucoma  and other disorders of the eye. Is the patient up to date with their annual eye exam?  Yes  Who is the provider or what is the name of the office in which the patient attends annual eye exams? [facility] If pt is not established with a provider, would they like to be referred to a provider to establish care? No .   Dental Screening: Recommended annual dental exams for proper oral hygiene  Diabetic Foot Exam: Diabetic Foot Exam: Completed 02/10/23  Community Resource Referral / Chronic Care Management:  CRR required this visit?  No   CCM required this visit?  No     Plan:     I have personally reviewed and noted the following in the patient's chart:   Medical and social history Use of alcohol, tobacco or illicit drugs  Current medications and supplements including opioid prescriptions. Patient is not currently taking opioid prescriptions. Functional ability and  status Nutritional status Physical activity Advanced directives List of other physicians Hospitalizations, surgeries, and ER visits in previous 12 months Vitals Screenings to include cognitive, depression, and falls Referrals and appointments  In addition, I have reviewed and discussed with patient certain preventive protocols, quality metrics, and best practice recommendations. A written personalized care plan for preventive services as well as general preventive health recommendations were provided to patient.     Tillie Rung, LPN   03/13/3085   After Visit Summary: (In Person-Printed) AVS printed and given to the patient  Nurse Notes: None

## 2023-11-17 NOTE — Patient Instructions (Addendum)
 Ms. Nile , Thank you for taking time to come for your Medicare Wellness Visit. I appreciate your ongoing commitment to your health goals. Please review the following plan we discussed and let me know if I can assist you in the future.   Referrals/Orders/Follow-Ups/Clinician Recommendations:   This is a list of the screening recommended for you and due dates:  Health Maintenance  Topic Date Due   Pneumonia Vaccine (1 of 2 - PCV) Never done   COVID-19 Vaccine (6 - 2024-25 season) 05/25/2023   Eye exam for diabetics  10/02/2023   Flu Shot  12/22/2023*   Zoster (Shingles) Vaccine (1 of 2) 02/10/2024*   DTaP/Tdap/Td vaccine (2 - Td or Tdap) 12/06/2023   Complete foot exam   02/10/2024   Hemoglobin A1C  03/08/2024   Mammogram  08/22/2024   Yearly kidney function blood test for diabetes  09/07/2024   Yearly kidney health urinalysis for diabetes  09/07/2024   Medicare Annual Wellness Visit  11/16/2024   Colon Cancer Screening  08/21/2027   DEXA scan (bone density measurement)  Completed   Hepatitis C Screening  Completed   HPV Vaccine  Aged Out  *Topic was postponed. The date shown is not the original due date.    Advanced directives: (Copy Requested) Please bring a copy of your health care power of attorney and living will to the office to be added to your chart at your convenience.  Next Medicare Annual Wellness Visit scheduled for next year: Yes

## 2024-01-01 DIAGNOSIS — H401131 Primary open-angle glaucoma, bilateral, mild stage: Secondary | ICD-10-CM | POA: Diagnosis not present

## 2024-01-01 DIAGNOSIS — Z0100 Encounter for examination of eyes and vision without abnormal findings: Secondary | ICD-10-CM | POA: Diagnosis not present

## 2024-01-07 ENCOUNTER — Ambulatory Visit (INDEPENDENT_AMBULATORY_CARE_PROVIDER_SITE_OTHER): Payer: Medicare HMO | Admitting: Family Medicine

## 2024-01-07 ENCOUNTER — Encounter: Payer: Self-pay | Admitting: Family Medicine

## 2024-01-07 VITALS — BP 126/70 | HR 95 | Temp 98.0°F | Wt 177.6 lb

## 2024-01-07 DIAGNOSIS — E876 Hypokalemia: Secondary | ICD-10-CM | POA: Diagnosis not present

## 2024-01-07 DIAGNOSIS — I10 Essential (primary) hypertension: Secondary | ICD-10-CM

## 2024-01-07 DIAGNOSIS — E119 Type 2 diabetes mellitus without complications: Secondary | ICD-10-CM | POA: Diagnosis not present

## 2024-01-07 DIAGNOSIS — Z7984 Long term (current) use of oral hypoglycemic drugs: Secondary | ICD-10-CM | POA: Diagnosis not present

## 2024-01-07 LAB — POCT GLYCOSYLATED HEMOGLOBIN (HGB A1C): HbA1c, POC (prediabetic range): 6.2 % (ref 5.7–6.4)

## 2024-01-07 NOTE — Patient Instructions (Signed)
 Your hemoglobin A1C is now 6.2%!!!!! This is great.  Keep up the good work.  It was 7.0% on 09/08/2023.

## 2024-01-07 NOTE — Progress Notes (Signed)
 Established Patient Office Visit   Subjective  Patient ID: Coleta Grosshans, female    DOB: 1956/02/27  Age: 68 y.o. MRN: [id number]  Chief Complaint  Patient presents with   Follow-up    Patient is a 68 year old female seen for follow-up.  Pt exercising consistently with her trainer and swimming.  Lost 3lbs. Hoping A1C has continued to improve.  Was 7.0% on 09/08/2023.  Patient states blood pressure has been well-controlled.  Dealing with the increased pollen allergy symptoms/dry eyes.  Had recent follow-up with ophthalmology.  States eye pressure has improved to around 13 and 14.  Typically 18 even after laser procedure.  Patient mentions having difficulty swallowing Klor-Con.  Tried dissolving in water but noticed residuals from pill in cup.    Patient Active Problem List   Diagnosis Date Noted   Coronary vasospasm (HCC) 08/20/2021   Mixed hyperlipidemia 06/27/2021   Postoperative hypothyroidism 06/27/2021   Type 2 diabetes mellitus with hyperlipidemia (HCC)    Essential hypertension 09/27/2013   RBBB 09/27/2013   NSTEMI (non-ST elevated myocardial infarction) (HCC) 09/21/2012   Past Medical History:  Diagnosis Date   Anemia    Arthritis    Diabetes mellitus    Hypercholesteremia 2011?   Hypertension    NSTEMI (non-ST elevated myocardial infarction) [facility]) December 2013   negative cath, normal LV function - felt to be vasospasm   Thyroid disease    Past Surgical History:  Procedure Laterality Date   CARDIAC CATHETERIZATION  December 2013   normal coronaries and normal LV function   FOOT SURGERY     LEFT HEART CATHETERIZATION WITH CORONARY ANGIOGRAM N/A 09/22/2012   Procedure: LEFT HEART CATHETERIZATION WITH CORONARY ANGIOGRAM;  Surgeon: Tonny Bollman, MD;  Location: [facility] CATH LAB;  Service: Cardiovascular;  Laterality: N/A;   THYROID SURGERY     Partial removed per pt   Social History   Tobacco Use   Smoking status: Former    Current packs/day: 0.00    Types:  Cigarettes    Quit date: 11/21/1990    Years since quitting: 33.1   Smokeless tobacco: Never  Vaping Use   Vaping status: Never Used  Substance Use Topics   Alcohol use: No   Drug use: No   History reviewed. No pertinent family history. Allergies  Allergen Reactions   Egg-Derived Products Other (See Comments)    Unknown reaction-patient states she was allergic to egg yolks when she was little. She can eat eggs now, but does not take flu shot or other egg-containing medications.   Penicillins Other (See Comments)    Unknown-reaction as a child    Pollen Extract       ROS Negative unless stated above    Objective:     BP 126/70 (BP Location: Left Arm, Patient Position: Sitting, Cuff Size: Large)   Pulse 95   Temp 98 F (36.7 C) (Oral)   Wt 177 lb 9.6 oz (80.6 kg)   SpO2 98%   BMI 29.55 kg/m  BP Readings from Last 3 Encounters:  01/07/24 126/70  11/17/23 120/60  10/08/23 100/70   Wt Readings from Last 3 Encounters:  01/07/24 177 lb 9.6 oz (80.6 kg)  11/17/23 179 lb 8 oz (81.4 kg)  10/08/23 182 lb 3.2 oz (82.6 kg)      Physical Exam Constitutional:      General: She is not in acute distress.    Appearance: Normal appearance.  HENT:     Head: Normocephalic and atraumatic.  Nose: Nose normal.     Mouth/Throat:     Mouth: Mucous membranes are moist.  Cardiovascular:     Rate and Rhythm: Normal rate and regular rhythm.     Heart sounds: Normal heart sounds. No murmur heard.    No gallop.  Pulmonary:     Effort: Pulmonary effort is normal. No respiratory distress.     Breath sounds: Normal breath sounds. No wheezing, rhonchi or rales.  Skin:    General: Skin is warm and dry.  Neurological:     Mental Status: She is alert and oriented to person, place, and time.      Results for orders placed or performed in visit on 01/07/24  POC HgB A1c  Result Value Ref Range   Hemoglobin A1C     HbA1c POC (<> result, manual entry)     HbA1c, POC (prediabetic  range) 6.2 5.7 - 6.4 %   HbA1c, POC (controlled diabetic range)        Assessment & Plan:  Controlled type 2 diabetes mellitus without complication, without long-term current use of insulin (HCC) -     POCT glycosylated hemoglobin (Hb A1C)  Essential hypertension  Hypokalemia  Diabetes well-controlled.  Hemoglobin A1c previously 7.0% on 09/08/2023.  6.2% this visit.  Continue glipizide 5 mg twice daily, metformin 1000 mg twice daily.  Continue ARB and statin.  Foot exam at next [facility].  Continue regular eye exams.  BP well-controlled.  Continue valsartan-hydrochlorothiazide 160-25 mg daily, Coreg 3.125 mg twice daily, Norvasc 5 mg daily.  Continue Klor-Con for hypokalemia likely from hydrochlorothiazide.  Ok to dissolve pill, may need to add additional water to cup to get remaining residue.  Continue lifestyle modifications.  Return in about 4 months (around 05/08/2024).   Viola Greulich, MD

## 2024-03-17 ENCOUNTER — Other Ambulatory Visit: Payer: Self-pay | Admitting: Cardiovascular Disease

## 2024-03-17 DIAGNOSIS — I10 Essential (primary) hypertension: Secondary | ICD-10-CM

## 2024-03-18 ENCOUNTER — Other Ambulatory Visit: Payer: Self-pay | Admitting: Family Medicine

## 2024-03-18 DIAGNOSIS — E785 Hyperlipidemia, unspecified: Secondary | ICD-10-CM

## 2024-04-05 DIAGNOSIS — H401131 Primary open-angle glaucoma, bilateral, mild stage: Secondary | ICD-10-CM | POA: Diagnosis not present

## 2024-04-19 ENCOUNTER — Telehealth: Payer: Self-pay

## 2024-04-19 ENCOUNTER — Ambulatory Visit (INDEPENDENT_AMBULATORY_CARE_PROVIDER_SITE_OTHER): Admitting: Family Medicine

## 2024-04-19 VITALS — BP 130/76 | HR 96 | Temp 99.7°F | Wt 175.2 lb

## 2024-04-19 DIAGNOSIS — U071 COVID-19: Secondary | ICD-10-CM | POA: Diagnosis not present

## 2024-04-19 DIAGNOSIS — J029 Acute pharyngitis, unspecified: Secondary | ICD-10-CM | POA: Diagnosis not present

## 2024-04-19 LAB — POC COVID19 BINAXNOW: SARS Coronavirus 2 Ag: POSITIVE — AB

## 2024-04-19 MED ORDER — NIRMATRELVIR/RITONAVIR (PAXLOVID)TABLET
3.0000 | ORAL_TABLET | Freq: Two times a day (BID) | ORAL | 0 refills | Status: AC
Start: 2024-04-19 — End: 2024-04-24

## 2024-04-19 NOTE — Telephone Encounter (Signed)
 I spoke with Odis at [facility] and he stated that Molnupiravir  is not covered by insurance. Odis stated that we can advise the patient to reach out to manufacture for coupon.

## 2024-04-19 NOTE — Telephone Encounter (Signed)
 Copied from CRM [id number]. Topic: Clinical - Prescription Issue >> Apr 19, 2024  3:57 PM Thersia C wrote: Reason for CRM: Patient called in regarding prescription nirmatrelvir /ritonavir  (PAXLOVID ) 20 x 150 MG & 10 x 100MG  TABS , stated she went to pick it up and it was $200 can not afford that, wanted to know if there is any cheaper alternative, if so could it be sent over to the pharmacy please and can she be notified once it has been sent

## 2024-04-19 NOTE — Progress Notes (Signed)
 Jeanne [name]

## 2024-04-19 NOTE — Progress Notes (Signed)
 Established Patient Office Visit  Subjective   Patient ID: Jeanne [name], female    DOB: 1956/06/17  Age: 68 y.o. MRN: [id number]  Chief Complaint  Patient presents with   Cough   Nasal Congestion   Generalized Body Aches   Headache   Sore Throat    HPI   Ms. Endres is seen with respiratory symptoms that started 3 days ago on Friday.  She initially had some sore throat followed by cough, body aches and just earlier today had little bit of diarrhea.  She has had some headaches intermittently and increased fatigue.  No known sick contacts.  No vomiting.  Does have underlying comorbidities of hypertension, CAD, type 2 diabetes.  She states she has had COVID at least once previously and reportedly did well with Paxlovid  at that time.  Last GFR was 53  Past Medical History:  Diagnosis Date   Anemia    Arthritis    Diabetes mellitus    Hypercholesteremia 2011?   Hypertension    NSTEMI (non-ST elevated myocardial infarction) [facility]) December 2013   negative cath, normal LV function - felt to be vasospasm   Thyroid  disease    Past Surgical History:  Procedure Laterality Date   CARDIAC CATHETERIZATION  December 2013   normal coronaries and normal LV function   FOOT SURGERY     LEFT HEART CATHETERIZATION WITH CORONARY ANGIOGRAM N/A 09/22/2012   Procedure: LEFT HEART CATHETERIZATION WITH CORONARY ANGIOGRAM;  Surgeon: Ozell Fell, MD;  Location: [facility] CATH LAB;  Service: Cardiovascular;  Laterality: N/A;   THYROID  SURGERY     Partial removed per pt    reports that she quit smoking about 33 years ago. Her smoking use included cigarettes. She has never used smokeless tobacco. She reports that she does not drink alcohol and does not use drugs. family history is not on file. Allergies  Allergen Reactions   Egg-Derived Products Other (See Comments)    Unknown reaction-patient states she was allergic to egg yolks when she was little. She can eat eggs now, but does not take flu  shot or other egg-containing medications.   Penicillins Other (See Comments)    Unknown-reaction as a child    Pollen Extract     Review of Systems  Constitutional:  Positive for chills and fever.  HENT:  Positive for sore throat.   Respiratory:  Positive for cough.   Cardiovascular:  Negative for chest pain.  Gastrointestinal:  Positive for diarrhea. Negative for blood in stool, nausea and vomiting.  Genitourinary:  Negative for dysuria.  Musculoskeletal:  Positive for myalgias.  Neurological:  Positive for headaches.      Objective:     BP 130/76 (BP Location: Left Arm, Patient Position: Sitting, Cuff Size: Normal)   Pulse 96   Temp 99.7 F (37.6 C) (Oral)   Wt 175 lb 3.2 oz (79.5 kg)   SpO2 96%   BMI 29.15 kg/m  BP Readings from Last 3 Encounters:  04/19/24 130/76  01/07/24 126/70  11/17/23 120/60   Wt Readings from Last 3 Encounters:  04/19/24 175 lb 3.2 oz (79.5 kg)  01/07/24 177 lb 9.6 oz (80.6 kg)  11/17/23 179 lb 8 oz (81.4 kg)      Physical Exam Vitals reviewed.  Constitutional:      General: She is not in acute distress.    Appearance: She is not ill-appearing or toxic-appearing.  Cardiovascular:     Rate and Rhythm: Normal rate and regular rhythm.  Pulmonary:  Effort: Pulmonary effort is normal.     Breath sounds: Normal breath sounds. No wheezing or rales.  Musculoskeletal:     Cervical back: Neck supple.  Neurological:     Mental Status: She is alert.      Results for orders placed or performed in visit on 04/19/24  POC COVID-19  Result Value Ref Range   SARS Coronavirus 2 Ag Positive (A) Negative      The ASCVD Risk score (Arnett DK, et al., 2019) failed to calculate for the following reasons:   Risk score cannot be calculated because patient has a medical history suggesting prior/existing ASCVD    Assessment & Plan:   Problem List Items Addressed This Visit   None Visit Diagnoses       Sore throat    -  Primary    Relevant Orders   POC COVID-19 (Completed)     COVID-19.  She tested positive here in the office today.  No respiratory distress.  Discussed pros and cons of antiviral therapy.  She would like to proceed.  We prescribed Paxlovid  to take twice daily for 5 days.  She does have normal renal function with most recent GFR 83 Continue plenty of fluids and rest.  Follow-up for any persistent or worsening symptoms.  No follow-ups on file.    Wolm Scarlet, MD

## 2024-04-20 NOTE — Telephone Encounter (Signed)
 Patient informed of the message below and voiced understanding

## 2024-04-23 ENCOUNTER — Telehealth: Payer: Self-pay | Admitting: Family Medicine

## 2024-04-23 NOTE — Telephone Encounter (Signed)
 Copied from CRM [id number]. Topic: Clinical - Prescription Issue >> Apr 23, 2024  9:19 AM Gennette ORN wrote: Reason for CRM: Patient called in regarding needing a prescription she wants to follow up on the call from 04/19/24. She stated she also has COVID and can't rid of the cough she has [phone].

## 2024-04-25 ENCOUNTER — Encounter: Payer: Self-pay | Admitting: Family Medicine

## 2024-04-26 MED ORDER — BENZONATATE 100 MG PO CAPS
ORAL_CAPSULE | ORAL | 0 refills | Status: AC
Start: 2024-04-26 — End: ?

## 2024-04-26 NOTE — Telephone Encounter (Signed)
 Please see encounter from 04/23/2024

## 2024-04-26 NOTE — Telephone Encounter (Signed)
Rx sent and patient informed of the message below 

## 2024-04-27 NOTE — Telephone Encounter (Signed)
 Called patient unable to leave VM, patient can sch appt with Dr. Mercer for continued symptoms

## 2024-05-10 ENCOUNTER — Ambulatory Visit: Admitting: Family Medicine

## 2024-05-10 ENCOUNTER — Encounter: Payer: Self-pay | Admitting: Family Medicine

## 2024-05-10 VITALS — BP 136/80 | HR 82 | Temp 98.6°F | Wt 175.4 lb

## 2024-05-10 DIAGNOSIS — E119 Type 2 diabetes mellitus without complications: Secondary | ICD-10-CM

## 2024-05-10 DIAGNOSIS — I10 Essential (primary) hypertension: Secondary | ICD-10-CM | POA: Diagnosis not present

## 2024-05-10 DIAGNOSIS — E876 Hypokalemia: Secondary | ICD-10-CM | POA: Diagnosis not present

## 2024-05-10 DIAGNOSIS — Z7984 Long term (current) use of oral hypoglycemic drugs: Secondary | ICD-10-CM | POA: Diagnosis not present

## 2024-05-10 LAB — BASIC METABOLIC PANEL WITH GFR
BUN: 14 mg/dL (ref 6–23)
CO2: 28 meq/L (ref 19–32)
Calcium: 9.9 mg/dL (ref 8.4–10.5)
Chloride: 98 meq/L (ref 96–112)
Creatinine, Ser: 0.74 mg/dL (ref 0.40–1.20)
GFR: 82.92 mL/min (ref >60.00)
Glucose, Bld: 125 mg/dL — ABNORMAL HIGH (ref 70–99)
Potassium: 3.8 meq/L (ref 3.5–5.1)
Sodium: 138 meq/L (ref 135–145)

## 2024-05-10 LAB — MICROALBUMIN / CREATININE URINE RATIO
Creatinine,U: 125 mg/dL
Microalb Creat Ratio: 7.1 mg/g (ref 0.0–30.0)
Microalb, Ur: 0.9 mg/dL (ref 0.0–1.9)

## 2024-05-10 LAB — MAGNESIUM: Magnesium: 1.6 mg/dL (ref 1.5–2.5)

## 2024-05-10 NOTE — Progress Notes (Signed)
 Established Patient Office Visit   Subjective  Patient ID: Jeanne [name], female    DOB: February 22, 1956  Age: 68 y.o. MRN: [id number]  Chief Complaint  Patient presents with   Medical Management of Chronic Issues    Patient came in today for 4 month follow-up, for Diabetes, Blood pressure, Hypokalemia, BG is 99-131 @home      Pt is a 68 year old female seen for follow-up on chronic conditions.  Patient recovering from COVID at the end of July.  Feeling better.  Has noticed SOB with returning to exercise class.  Was checking BP at home but monitor stopped working.  Taking potassium supplement intermittently.  Checking blood sugar randomly.  Readings throughout the day range from 99-131.  Patient denies dizziness, headaches, LE edema.    Patient Active Problem List   Diagnosis Date Noted   Coronary vasospasm (HCC) 08/20/2021   Mixed hyperlipidemia 06/27/2021   Postoperative hypothyroidism 06/27/2021   Type 2 diabetes mellitus with hyperlipidemia (HCC)    Essential hypertension 09/27/2013   RBBB 09/27/2013   NSTEMI (non-ST elevated myocardial infarction) (HCC) 09/21/2012   Past Medical History:  Diagnosis Date   Allergy May 1978   Anemia    Arthritis    Diabetes mellitus    Hypercholesteremia 2011?   Hypertension    NSTEMI (non-ST elevated myocardial infarction) (HCC) 08/2012   negative cath, normal LV function - felt to be vasospasm   Thyroid  disease    Past Surgical History:  Procedure Laterality Date   CARDIAC CATHETERIZATION  08/23/2012   normal coronaries and normal LV function   CESAREAN SECTION  July 1993   EYE SURGERY  June 2014   FOOT SURGERY     LEFT HEART CATHETERIZATION WITH CORONARY ANGIOGRAM N/A 09/22/2012   Procedure: LEFT HEART CATHETERIZATION WITH CORONARY ANGIOGRAM;  Surgeon: Ozell Fell, MD;  Location: [facility] C[facility] LAB;  Service: Cardiovascular;  Laterality: N/A;   THYROID  SURGERY     Partial removed per pt   TUBAL LIGATION  Sept 1993   Social  History   Tobacco Use   Smoking status: Former    Current packs/day: 0.00    Types: Cigarettes    Quit date: 11/21/1990    Years since quitting: 33.4   Smokeless tobacco: Never  Vaping Use   Vaping status: Never Used  Substance Use Topics   Alcohol use: No   Drug use: No   Family History  Problem Relation Age of Onset   Hypertension Mother    Stroke Mother    Arthritis Paternal Aunt    Cancer Maternal Uncle    Diabetes Maternal Aunt    Early death Brother    Allergies  Allergen Reactions   Egg-Derived Products Other (See Comments)    Unknown reaction-patient states she was allergic to egg yolks when she was little. She can eat eggs now, but does not take flu shot or other egg-containing medications.   Penicillins Other (See Comments)    Unknown-reaction as a child    Pollen Extract     ROS Negative unless stated above    Objective:     BP 136/80 (Patient Position: Sitting)   Pulse 82   Temp 98.6 F (37 C)   Wt 175 lb 6.4 oz (79.6 kg)   SpO2 99%   BMI 29.19 kg/m  BP Readings from Last 3 Encounters:  05/10/24 136/80  04/19/24 130/76  01/07/24 126/70   Wt Readings from Last 3 Encounters:  05/10/24 175 lb 6.4 oz (79.6 kg)  04/19/24 175 lb 3.2 oz (79.5 kg)  01/07/24 177 lb 9.6 oz (80.6 kg)      Physical Exam Constitutional:      General: She is not in acute distress.    Appearance: Normal appearance.  HENT:     Head: Normocephalic and atraumatic.     Nose: Nose normal.     Mouth/Throat:     Mouth: Mucous membranes are moist.  Cardiovascular:     Rate and Rhythm: Normal rate and regular rhythm.     Heart sounds: Normal heart sounds. No murmur heard.    No gallop.  Pulmonary:     Effort: Pulmonary effort is normal. No respiratory distress.     Breath sounds: Normal breath sounds. No wheezing, rhonchi or rales.  Skin:    General: Skin is warm and dry.  Neurological:     Mental Status: She is alert and oriented to person, place, and time.         05/10/2024    9:42 AM 11/17/2023    4:02 PM 09/08/2023    2:22 PM  Depression screen PHQ 2/9  Decreased Interest 0 0 0  Down, Depressed, Hopeless 0 0 0  PHQ - 2 Score 0 0 0  Altered sleeping 1  0  Tired, decreased energy 0  0  Change in appetite 0  0  Feeling bad or failure about yourself  0  0  Trouble concentrating 0  0  Moving slowly or fidgety/restless 0  0  Suicidal thoughts 0  0  PHQ-9 Score 1  0  Difficult doing work/chores Not difficult at all  Not difficult at all      05/10/2024    9:42 AM 09/08/2023    2:22 PM 03/24/2023    1:30 PM 02/10/2023   11:39 AM  GAD 7 : Generalized Anxiety Score  Nervous, Anxious, on Edge 1 1 0 0  Control/stop worrying 0 0 0 1  Worry too much - different things 0 0 0 1  Trouble relaxing 0 0 0 0  Restless 0 0 0 0  Easily annoyed or irritable 0 1 0 0  Afraid - awful might happen 0 0 0 0  Total GAD 7 Score 1 2 0 2  Anxiety Difficulty Not difficult at all Not difficult at all Not difficult at all Not difficult at all   Diabetic Foot Exam - Simple   Simple Foot Form Diabetic Foot exam was performed with the following findings: Yes 05/10/2024 10:30 AM  Visual Inspection No deformities, no ulcerations, no other skin breakdown bilaterally: Yes Sensation Testing Intact to touch and monofilament testing bilaterally: Yes Pulse Check Posterior Tibialis and Dorsalis pulse intact bilaterally: Yes Comments      No results found for any visits on 05/10/24.    Assessment & Plan:   Controlled type 2 diabetes mellitus without complication, without long-term current use of insulin  (HCC) -     Microalbumin / creatinine urine ratio  Hypokalemia -     Basic metabolic panel with GFR; Future -     Magnesium; Future  Essential hypertension -     Basic metabolic panel with GFR; Future  DM 2 controlled.  A1c 6.2% on 01/07/2024.  Recheck due 07/08/2024.  Foot exam done this visit.  Continue monitoring blood sugar.  Continue metformin  1000 mg  twice daily and glipizide  5 mg twice daily.  Eye exam due.  Microalbumin creatinine ratio obtained next visit.  Recheck potassium and mag.  Replete as needed.  BP controlled.  Patient to look into obtaining new monitor.  Immunizations reviewed.  Consider Tdap and influenza vaccine at local pharmacy.  Return in about 4 months (around 09/09/2024) for physical.   Clotilda JONELLE Single, MD

## 2024-05-10 NOTE — Patient Instructions (Addendum)
 Consider getting the Tdap vaccine and flu vaccines at your local pharmacy.  Do not forget to set up your next physical on or after December 16th if you have not already done so.

## 2024-05-12 ENCOUNTER — Ambulatory Visit: Payer: Self-pay | Admitting: Family Medicine

## 2024-06-21 NOTE — Progress Notes (Signed)
 Jeanne [name]                                          MRN: [id number]   06/21/2024   The [facility] Quality Team Specialist reviewed this patient medical record for the purposes of chart review for care gap closure. The following were reviewed: abstraction for care gap closure-kidney health evaluation for diabetes:eGFR  and uACR.    [facility] Quality Team

## 2024-07-12 DIAGNOSIS — H401131 Primary open-angle glaucoma, bilateral, mild stage: Secondary | ICD-10-CM | POA: Diagnosis not present

## 2024-07-20 DIAGNOSIS — H401131 Primary open-angle glaucoma, bilateral, mild stage: Secondary | ICD-10-CM | POA: Diagnosis not present

## 2024-07-25 ENCOUNTER — Other Ambulatory Visit: Payer: Self-pay | Admitting: Family Medicine

## 2024-07-25 DIAGNOSIS — E119 Type 2 diabetes mellitus without complications: Secondary | ICD-10-CM

## 2024-07-25 DIAGNOSIS — E890 Postprocedural hypothyroidism: Secondary | ICD-10-CM

## 2024-07-25 DIAGNOSIS — I10 Essential (primary) hypertension: Secondary | ICD-10-CM

## 2024-07-25 DIAGNOSIS — E782 Mixed hyperlipidemia: Secondary | ICD-10-CM

## 2024-07-28 DIAGNOSIS — L2089 Other atopic dermatitis: Secondary | ICD-10-CM | POA: Diagnosis not present

## 2024-08-02 DIAGNOSIS — H401131 Primary open-angle glaucoma, bilateral, mild stage: Secondary | ICD-10-CM | POA: Diagnosis not present

## 2024-09-08 ENCOUNTER — Ambulatory Visit: Admitting: Family Medicine

## 2024-09-08 ENCOUNTER — Encounter: Payer: Self-pay | Admitting: Family Medicine

## 2024-09-08 VITALS — BP 124/78 | HR 100 | Temp 98.0°F | Ht 65.16 in | Wt 177.4 lb

## 2024-09-08 DIAGNOSIS — E890 Postprocedural hypothyroidism: Secondary | ICD-10-CM

## 2024-09-08 DIAGNOSIS — Z0000 Encounter for general adult medical examination without abnormal findings: Secondary | ICD-10-CM | POA: Diagnosis not present

## 2024-09-08 DIAGNOSIS — E876 Hypokalemia: Secondary | ICD-10-CM | POA: Diagnosis not present

## 2024-09-08 DIAGNOSIS — E119 Type 2 diabetes mellitus without complications: Secondary | ICD-10-CM

## 2024-09-08 DIAGNOSIS — I10 Essential (primary) hypertension: Secondary | ICD-10-CM | POA: Diagnosis not present

## 2024-09-08 DIAGNOSIS — E782 Mixed hyperlipidemia: Secondary | ICD-10-CM

## 2024-09-08 LAB — COMPREHENSIVE METABOLIC PANEL WITH GFR
ALT: 14 U/L (ref 3–35)
AST: 13 U/L (ref 5–37)
Albumin: 4.5 g/dL (ref 3.5–5.2)
Alkaline Phosphatase: 50 U/L (ref 39–117)
BUN: 17 mg/dL (ref 6–23)
CO2: 27 meq/L (ref 19–32)
Calcium: 9.6 mg/dL (ref 8.4–10.5)
Chloride: 103 meq/L (ref 96–112)
Creatinine, Ser: 0.82 mg/dL (ref 0.40–1.20)
GFR: 73.14 mL/min (ref >60.00)
Glucose, Bld: 108 mg/dL — ABNORMAL HIGH (ref 70–99)
Potassium: 4 meq/L (ref 3.5–5.1)
Sodium: 141 meq/L (ref 135–145)
Total Bilirubin: 0.3 mg/dL (ref 0.2–1.2)
Total Protein: 7.3 g/dL (ref 6.0–8.3)

## 2024-09-08 LAB — HEMOGLOBIN A1C: Hgb A1c MFr Bld: 6.7 % — ABNORMAL HIGH (ref 4.6–6.5)

## 2024-09-08 LAB — TSH: TSH: 1.24 u[IU]/mL (ref 0.35–5.50)

## 2024-09-08 LAB — CBC WITH DIFFERENTIAL/PLATELET
Basophils Absolute: 0 K/uL (ref 0.0–0.1)
Basophils Relative: 0.3 % (ref 0.0–3.0)
Eosinophils Absolute: 0.1 K/uL (ref 0.0–0.7)
Eosinophils Relative: 1.4 % (ref 0.0–5.0)
HCT: 36.9 % (ref 36.0–46.0)
Hemoglobin: 12.3 g/dL (ref 12.0–15.0)
Lymphocytes Relative: 33.1 % (ref 12.0–46.0)
Lymphs Abs: 1.9 K/uL (ref 0.7–4.0)
MCHC: 33.4 g/dL (ref 30.0–36.0)
MCV: 88.9 fl (ref 78.0–100.0)
Monocytes Absolute: 0.4 K/uL (ref 0.1–1.0)
Monocytes Relative: 7.6 % (ref 3.0–12.0)
Neutro Abs: 3.3 K/uL (ref 1.4–7.7)
Neutrophils Relative %: 57.6 % (ref 43.0–77.0)
Platelets: 306 K/uL (ref 150.0–400.0)
RBC: 4.15 Mil/uL (ref 3.87–5.11)
RDW: 14.4 % (ref 11.5–15.5)
WBC: 5.7 K/uL (ref 4.0–10.5)

## 2024-09-08 LAB — LIPID PANEL
Cholesterol: 130 mg/dL (ref 28–200)
HDL: 45.2 mg/dL (ref >39.00)
LDL Cholesterol: 65 mg/dL (ref 10–99)
NonHDL: 84.72
Total CHOL/HDL Ratio: 3
Triglycerides: 99 mg/dL (ref 10.0–149.0)
VLDL: 19.8 mg/dL (ref 0.0–40.0)

## 2024-09-08 NOTE — Progress Notes (Addendum)
 Established Patient Office Visit   Subjective  Patient ID: Jeanne [name], female    DOB: 1956/07/01  Age: 68 y.o. MRN: [id number]  Chief Complaint  Patient presents with   Annual Exam    Patient is a 68 year old female seen for CPE.  Pt doing well overall.  Feeling good.  Walking for exercise.  Dealing with some increased stress around the holidays, but managing.  Pt hoping to get off some of her medications.  Had recent eye exam status post surgery for glaucoma.  Seeing ophtho every few months.  Denies changes in vision or eye pain.  Followed by cardiology.    Patient Active Problem List   Diagnosis Date Noted   Coronary vasospasm 08/20/2021   Mixed hyperlipidemia 06/27/2021   Postoperative hypothyroidism 06/27/2021   Type 2 diabetes mellitus with hyperlipidemia (HCC)    Essential hypertension 09/27/2013   RBBB 09/27/2013   NSTEMI (non-ST elevated myocardial infarction) (HCC) 09/21/2012   Past Medical History:  Diagnosis Date   Allergy May 1978   Anemia    Arthritis    Diabetes mellitus    Hypercholesteremia 2011?   Hypertension    NSTEMI (non-ST elevated myocardial infarction) (HCC) 08/2012   negative cath, normal LV function - felt to be vasospasm   Thyroid  disease    Past Surgical History:  Procedure Laterality Date   CARDIAC CATHETERIZATION  08/23/2012   normal coronaries and normal LV function   CESAREAN SECTION  July 1993   EYE SURGERY  June 2014   FOOT SURGERY     LEFT HEART CATHETERIZATION WITH CORONARY ANGIOGRAM N/A 09/22/2012   Procedure: LEFT HEART CATHETERIZATION WITH CORONARY ANGIOGRAM;  Surgeon: Ozell Fell, MD;  Location: [facility] CATH LAB;  Service: Cardiovascular;  Laterality: N/A;   THYROID  SURGERY     Partial removed per pt   TUBAL LIGATION  Sept 1993   Social History[1] Family History  Problem Relation Age of Onset   Hypertension Mother    Stroke Mother    Arthritis Paternal Aunt    Cancer Maternal Uncle    Diabetes Maternal Aunt     Early death Brother    Allergies[2]  ROS Negative unless stated above    Objective:     BP 124/78 (BP Location: Left Arm, Patient Position: Sitting, Cuff Size: Large)   Pulse 100   Temp 98 F (36.7 C) (Oral)   Ht 5' 5.16 (1.655 m)   Wt 177 lb 6.4 oz (80.5 kg)   SpO2 99%   BMI 29.38 kg/m  BP Readings from Last 3 Encounters:  09/08/24 124/78  05/10/24 136/80  04/19/24 130/76   Wt Readings from Last 3 Encounters:  09/08/24 177 lb 6.4 oz (80.5 kg)  05/10/24 175 lb 6.4 oz (79.6 kg)  04/19/24 175 lb 3.2 oz (79.5 kg)      Physical Exam Constitutional:      Appearance: Normal appearance.  HENT:     Head: Normocephalic and atraumatic.     Right Ear: Tympanic membrane, ear canal and external ear normal.     Left Ear: Tympanic membrane, ear canal and external ear normal.     Nose: Nose normal.     Mouth/Throat:     Mouth: Mucous membranes are moist.     Pharynx: No oropharyngeal exudate or posterior oropharyngeal erythema.  Eyes:     General: No scleral icterus.    Extraocular Movements: Extraocular movements intact.     Conjunctiva/sclera: Conjunctivae normal.     Pupils: Pupils  are equal, round, and reactive to light.  Neck:     Thyroid : No thyromegaly.     Vascular: No carotid bruit.  Cardiovascular:     Rate and Rhythm: Normal rate and regular rhythm.     Pulses: Normal pulses.     Heart sounds: Normal heart sounds. No murmur heard.    No friction rub.  Pulmonary:     Effort: Pulmonary effort is normal.     Breath sounds: Normal breath sounds. No wheezing, rhonchi or rales.  Abdominal:     General: Bowel sounds are normal.     Palpations: Abdomen is soft.     Tenderness: There is no abdominal tenderness.  Musculoskeletal:        General: No deformity. Normal range of motion.     Comments: Rectus diastasis.  Lymphadenopathy:     Cervical: No cervical adenopathy.  Skin:    General: Skin is warm and dry.     Findings: No lesion.  Neurological:      General: No focal deficit present.     Mental Status: She is alert and oriented to person, place, and time.  Psychiatric:        Mood and Affect: Mood normal.        Thought Content: Thought content normal.        09/08/2024    9:48 AM 05/10/2024    9:42 AM 11/17/2023    4:02 PM  Depression screen PHQ 2/9  Decreased Interest 0 0 0  Down, Depressed, Hopeless 1 0 0  PHQ - 2 Score 1 0 0  Altered sleeping 1 1   Tired, decreased energy 0 0   Change in appetite 0 0   Feeling bad or failure about yourself  0 0   Trouble concentrating 0 0   Moving slowly or fidgety/restless 0 0   Suicidal thoughts 0 0   PHQ-9 Score 2 1    Difficult doing work/chores  Not difficult at all      Data saved with a previous flowsheet row definition      09/08/2024    9:48 AM 05/10/2024    9:42 AM 09/08/2023    2:22 PM 03/24/2023    1:30 PM  GAD 7 : Generalized Anxiety Score  Nervous, Anxious, on Edge 0 1 1 0  Control/stop worrying 0 0 0 0  Worry too much - different things 1 0 0 0  Trouble relaxing 0 0 0 0  Restless 0 0 0 0  Easily annoyed or irritable 1 0 1 0  Afraid - awful might happen 1 0 0 0  Total GAD 7 Score 3 1 2  0  Anxiety Difficulty Somewhat difficult Not difficult at all Not difficult at all Not difficult at all     No results found for any visits on 09/08/24.    Assessment & Plan:   Well adult exam -     CBC with Differential/Platelet; Future -     Comprehensive metabolic panel with GFR; Future -     Lipid panel; Future -     Hemoglobin A1c; Future -     TSH; Future  Essential hypertension -     Comprehensive metabolic panel with GFR; Future  Hypokalemia -     Comprehensive metabolic panel with GFR; Future  Controlled type 2 diabetes mellitus without complication, without long-term current use of insulin  (HCC) -     Hemoglobin A1c; Future  Mixed hyperlipidemia -     Lipid panel;  Future  Postoperative hypothyroidism -     TSH; Future  Age-appropriate health  screenings discussed.  Obtain labs.  Immunizations reviewed.  Mammogram scheduled for January 2026.  Eye exam and regular follow-up with Dr. Othelia s/p surgery for glaucoma.  Foot exam up-to-date done 05/10/2024.  Colonoscopy done 08/20/2017.  DM controlled.  A1c 6.2% 10/09/2023.  Continue current medications  including metformin  1000 mg twice daily, glipizide  5 mg twice daily.  BP controlled.  Continue Norvasc  5 mg daily, Coreg  3.125 mg twice daily, and valsartan -HCTZ 160-25 mg daily.  Recheck potassium.  If needed restart potassium supplement.  Continue pravastatin  10 mg daily.  No myalgias.  Continue Synthroid  75 mcg daily.  Return in about 6 months (around 03/09/2025).   Clotilda JONELLE Single, MD     [1]  Social History Tobacco Use   Smoking status: Former    Current packs/day: 0.00    Types: Cigarettes    Quit date: 11/21/1990    Years since quitting: 33.8   Smokeless tobacco: Never  Vaping Use   Vaping status: Never Used  Substance Use Topics   Alcohol use: No   Drug use: No  [2]  Allergies Allergen Reactions   Egg Protein-Containing Drug Products Other (See Comments)    Unknown reaction-patient states she was allergic to egg yolks when she was little. She can eat eggs now, but does not take flu shot or other egg-containing medications.   Penicillins Other (See Comments)    Unknown-reaction as a child    Pollen Extract

## 2024-09-30 ENCOUNTER — Encounter: Payer: Self-pay | Admitting: Cardiovascular Disease

## 2024-10-04 ENCOUNTER — Ambulatory Visit: Payer: Self-pay | Admitting: Family Medicine

## 2024-11-22 ENCOUNTER — Ambulatory Visit: Payer: Medicare HMO
